# Patient Record
Sex: Female | Born: 1935 | Race: White | Hispanic: No | Smoking: Never smoker
Health system: Southern US, Community
[De-identification: ages and names within clinical notes are randomized; demographics above are authoritative.]

## PROBLEM LIST (undated history)

## (undated) DIAGNOSIS — H269 Unspecified cataract: Secondary | ICD-10-CM

## (undated) DIAGNOSIS — R32 Unspecified urinary incontinence: Secondary | ICD-10-CM

## (undated) DIAGNOSIS — M810 Age-related osteoporosis without current pathological fracture: Secondary | ICD-10-CM

## (undated) DIAGNOSIS — Z8619 Personal history of other infectious and parasitic diseases: Secondary | ICD-10-CM

## (undated) DIAGNOSIS — K649 Unspecified hemorrhoids: Secondary | ICD-10-CM

## (undated) DIAGNOSIS — E559 Vitamin D deficiency, unspecified: Secondary | ICD-10-CM

## (undated) DIAGNOSIS — M7581 Other shoulder lesions, right shoulder: Secondary | ICD-10-CM

## (undated) DIAGNOSIS — Z8659 Personal history of other mental and behavioral disorders: Secondary | ICD-10-CM

## (undated) DIAGNOSIS — C801 Malignant (primary) neoplasm, unspecified: Secondary | ICD-10-CM

## (undated) DIAGNOSIS — M722 Plantar fascial fibromatosis: Secondary | ICD-10-CM

## (undated) DIAGNOSIS — T7840XA Allergy, unspecified, initial encounter: Secondary | ICD-10-CM

## (undated) DIAGNOSIS — R011 Cardiac murmur, unspecified: Secondary | ICD-10-CM

## (undated) DIAGNOSIS — E785 Hyperlipidemia, unspecified: Secondary | ICD-10-CM

## (undated) DIAGNOSIS — M1990 Unspecified osteoarthritis, unspecified site: Secondary | ICD-10-CM

## (undated) DIAGNOSIS — M8580 Other specified disorders of bone density and structure, unspecified site: Secondary | ICD-10-CM

## (undated) DIAGNOSIS — E348 Other specified endocrine disorders: Secondary | ICD-10-CM

## (undated) DIAGNOSIS — B181 Chronic viral hepatitis B without delta-agent: Secondary | ICD-10-CM

## (undated) HISTORY — DX: Personal history of other infectious and parasitic diseases: Z86.19

## (undated) HISTORY — DX: Age-related osteoporosis without current pathological fracture: M81.0

## (undated) HISTORY — DX: Other specified endocrine disorders: E34.8

## (undated) HISTORY — DX: Vitamin D deficiency, unspecified: E55.9

## (undated) HISTORY — DX: Other specified disorders of bone density and structure, unspecified site: M85.80

## (undated) HISTORY — DX: Plantar fascial fibromatosis: M72.2

## (undated) HISTORY — DX: Cardiac murmur, unspecified: R01.1

## (undated) HISTORY — PX: BLEPHAROPLASTY: SUR158

## (undated) HISTORY — DX: Personal history of other mental and behavioral disorders: Z86.59

## (undated) HISTORY — DX: Unspecified osteoarthritis, unspecified site: M19.90

## (undated) HISTORY — DX: Allergy, unspecified, initial encounter: T78.40XA

## (undated) HISTORY — DX: Unspecified cataract: H26.9

## (undated) HISTORY — PX: EYE SURGERY: SHX253

## (undated) HISTORY — DX: Other shoulder lesions, right shoulder: M75.81

## (undated) HISTORY — DX: Chronic viral hepatitis B without delta-agent: B18.1

## (undated) HISTORY — DX: Malignant (primary) neoplasm, unspecified: C80.1

## (undated) HISTORY — DX: Hyperlipidemia, unspecified: E78.5

## (undated) HISTORY — DX: Unspecified urinary incontinence: R32

## (undated) HISTORY — PX: TONSILLECTOMY: SHX5217

## (undated) HISTORY — PX: COLONOSCOPY: SHX174

## (undated) HISTORY — DX: Unspecified hemorrhoids: K64.9

---
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
## 2004-10-03 DIAGNOSIS — Z8659 Personal history of other mental and behavioral disorders: Secondary | ICD-10-CM

## 2004-10-03 HISTORY — DX: Personal history of other mental and behavioral disorders: Z86.59

## 2011-10-04 DIAGNOSIS — M7581 Other shoulder lesions, right shoulder: Secondary | ICD-10-CM

## 2011-10-04 HISTORY — DX: Other shoulder lesions, right shoulder: M75.81

## 2012-11-03 LAB — HM COLONOSCOPY

## 2013-11-04 ENCOUNTER — Ambulatory Visit (INDEPENDENT_AMBULATORY_CARE_PROVIDER_SITE_OTHER): Payer: Managed Care, Other (non HMO) | Admitting: Internal Medicine

## 2013-11-04 ENCOUNTER — Encounter: Payer: Self-pay | Admitting: Internal Medicine

## 2013-11-04 ENCOUNTER — Ambulatory Visit (INDEPENDENT_AMBULATORY_CARE_PROVIDER_SITE_OTHER)
Admission: RE | Admit: 2013-11-04 | Discharge: 2013-11-04 | Disposition: A | Discharge status: Home / Self Care | Payer: Managed Care, Other (non HMO) | Source: Ambulatory Visit | Attending: Internal Medicine | Admitting: Internal Medicine

## 2013-11-04 VITALS — BP 128/68 | HR 76 | Temp 98.3°F | Ht 62.0 in | Wt 139.6 lb

## 2013-11-04 DIAGNOSIS — R059 Cough, unspecified: Secondary | ICD-10-CM

## 2013-11-04 DIAGNOSIS — R32 Unspecified urinary incontinence: Secondary | ICD-10-CM | POA: Insufficient documentation

## 2013-11-04 DIAGNOSIS — R05 Cough: Secondary | ICD-10-CM

## 2013-11-04 DIAGNOSIS — B181 Chronic viral hepatitis B without delta-agent: Secondary | ICD-10-CM | POA: Insufficient documentation

## 2013-11-04 DIAGNOSIS — J309 Allergic rhinitis, unspecified: Secondary | ICD-10-CM

## 2013-11-04 DIAGNOSIS — E785 Hyperlipidemia, unspecified: Secondary | ICD-10-CM | POA: Insufficient documentation

## 2013-11-04 DIAGNOSIS — Z8619 Personal history of other infectious and parasitic diseases: Secondary | ICD-10-CM | POA: Insufficient documentation

## 2013-11-04 MED ORDER — LORATADINE 10 MG PO TABS: 10.0000 mg | ORAL_TABLET | Freq: Every day | ORAL | Status: DC

## 2013-11-04 MED ORDER — DM-GUAIFENESIN ER 60-1200 MG PO TB12: 1.0000 | ORAL_TABLET | Freq: Two times a day (BID) | ORAL | Status: DC

## 2013-11-04 MED ORDER — PHENYLEPH-PROMETHAZINE-COD 5-6.25-10 MG/5ML PO SYRP: 5.0000 mL | ORAL_SOLUTION | ORAL | Status: DC | PRN

## 2013-11-04 NOTE — Patient Instructions (Signed)
It was good to see you today.  We have reviewed your prior records including labs and tests today  Chest xray ordered today. Your results will be released to [place] (or called to you) after review, usually within 72hours after test completion. If any changes need to be made, you will be notified at that same time.  Medications reviewed and updated I recommend taking generic Claritin once daily, Mucinex DM every morning and prescription cough medication, especially at night for one week, then as needed  Your prescription(s) have been Given to you to submit to your pharmacy. Please take as directed and contact our office if you believe you are having problem(s) with the medication(s).  followup in next 6-8 weeks for annual exam, please call sooner if problems

## 2013-11-04 NOTE — Progress Notes (Signed)
Subjective:    Patient ID: Terry Edwards, female    DOB: 1935-11-08, 78 y.o.   MRN: [id number]  Cough This is a recurrent problem. The current episode started in the past 7 days (initial sx early 10/2013 (>30 days ago)). The problem has been waxing and waning. The problem occurs hourly. The cough is non-productive. Associated symptoms include nasal congestion, postnasal drip and rhinorrhea. Pertinent negatives include no chest pain, chills, ear congestion, ear pain, fever, headaches, heartburn, hemoptysis ("brown" sputum on occ), rash, sore throat, shortness of breath, sweats, weight loss or wheezing. Nothing aggravates the symptoms. Risk factors for lung disease include travel. Treatments tried: Zpak 3 weeks ago, Vit C. The treatment provided mild relief. There is no history of asthma, bronchiectasis, COPD, emphysema or environmental allergies.    Past Medical History  Diagnosis Date  . Hyperlipidemia   . Urine incontinence   . History of chicken pox   . Chronic hepatitis B without hepatic coma     Review of Systems  Constitutional: Negative for fever, chills and weight loss.  HENT: Positive for postnasal drip and rhinorrhea. Negative for ear pain and sore throat.   Respiratory: Positive for cough. Negative for hemoptysis ("brown" sputum on occ), shortness of breath and wheezing.   Cardiovascular: Negative for chest pain.  Gastrointestinal: Negative for heartburn.  Skin: Negative for rash.  Allergic/Immunologic: Negative for environmental allergies.  Neurological: Negative for headaches.       Objective:   Physical Exam BP 128/68  Pulse 76  Temp(Src) 98.3 F (36.8 C) (Oral)  Ht 5\' 2"  (1.575 m)  Wt 139 lb 9.6 oz (63.322 kg)  BMI 25.53 kg/m2  SpO2 96% Wt Readings from Last 3 Encounters:  11/04/13 139 lb 9.6 oz (63.322 kg)   Constitutional: She appears well-developed and well-nourished. No distress. congested but dry cough HENT: Head: Normocephalic and atraumatic. sinus  nontender to palpation Ears: B TMs ok, no erythema or effusion; Nose: pale turbinates with clear discharge, no purulence. Mouth/Throat: Oropharynx is clear and moist. No oropharyngeal exudate. clear PND evident Eyes: Conjunctivae and EOM are normal. Pupils are equal, round, and reactive to light. No scleral icterus.  Neck: Normal range of motion. Neck supple. No JVD present. No thyromegaly present.  Cardiovascular: Normal rate, regular rhythm and normal heart sounds.  No murmur heard. No BLE edema. Pulmonary/Chest: Effort normal and breath sounds normal. No respiratory distress. She has no wheezes.  Abdominal: Soft. Bowel sounds are normal. She exhibits no distension. There is no tenderness. no masses Musculoskeletal: Normal range of motion, no joint effusions. No gross deformities Neurological: She is alert and oriented to person, place, and time. No cranial nerve deficit. Coordination, balance, strength, speech and gait are normal.  Skin: Skin is warm and dry. No rash noted. No erythema.  Psychiatric: She has a normal mood and affect. Her behavior is normal. Judgment and thought content normal.   No results found for this basename: WBC, HGB, HCT, PLT, GLUCOSE, CHOL, TRIG, HDL, LDLDIRECT, LDLCALC, ALT, AST, NA, K, CL, CREATININE, BUN, CO2, TSH, PSA, INR, GLUF, HGBA1C, MICROALBUR      Assessment & Plan:   Cough - intermittent and recurrent symptoms over past 5 weeks since moving into new home. Recent move to [place] from [place] reviewed including travel to [place] over holidays.  No fever, normal O2 sats, no shortness of breath or pain but will check chest x-ray because of persisting brown sputum greater than 30 days  Symptoms appear consistent with  allergic rhinitis and postnasal drip causing cough  Will hold empiric antibiotics unless abnormal chest x-ray Treat etiology of symptoms with daily antihistamine and symptomatic cough relief -Mucinex DM each morning and codeine syrup  qhs  Addendum. Chest x-ray clear. Patient notified of same. No antibiotics indicated at this time. Patient will call if symptoms worse or unimproved with treatment as prescribed

## 2013-11-04 NOTE — Progress Notes (Signed)
Pre-visit discussion using our clinic review tool. No additional management support is needed unless otherwise documented below in the visit note.  

## 2013-11-11 ENCOUNTER — Ambulatory Visit: Payer: Managed Care, Other (non HMO) | Admitting: Internal Medicine

## 2013-12-30 ENCOUNTER — Ambulatory Visit (INDEPENDENT_AMBULATORY_CARE_PROVIDER_SITE_OTHER): Payer: Managed Care, Other (non HMO) | Admitting: Internal Medicine

## 2013-12-30 ENCOUNTER — Encounter: Payer: Self-pay | Admitting: Internal Medicine

## 2013-12-30 VITALS — BP 120/72 | HR 67 | Temp 97.9°F | Ht 62.0 in | Wt 136.0 lb

## 2013-12-30 DIAGNOSIS — M949 Disorder of cartilage, unspecified: Secondary | ICD-10-CM

## 2013-12-30 DIAGNOSIS — M8580 Other specified disorders of bone density and structure, unspecified site: Secondary | ICD-10-CM

## 2013-12-30 DIAGNOSIS — M7052 Other bursitis of knee, left knee: Secondary | ICD-10-CM

## 2013-12-30 DIAGNOSIS — M7581 Other shoulder lesions, right shoulder: Secondary | ICD-10-CM

## 2013-12-30 DIAGNOSIS — Z8659 Personal history of other mental and behavioral disorders: Secondary | ICD-10-CM

## 2013-12-30 DIAGNOSIS — E785 Hyperlipidemia, unspecified: Secondary | ICD-10-CM

## 2013-12-30 DIAGNOSIS — Z0000 Encounter for general adult medical examination without abnormal findings: Secondary | ICD-10-CM

## 2013-12-30 DIAGNOSIS — M899 Disorder of bone, unspecified: Secondary | ICD-10-CM

## 2013-12-30 DIAGNOSIS — E559 Vitamin D deficiency, unspecified: Secondary | ICD-10-CM

## 2013-12-30 DIAGNOSIS — B181 Chronic viral hepatitis B without delta-agent: Secondary | ICD-10-CM

## 2013-12-30 DIAGNOSIS — E348 Other specified endocrine disorders: Secondary | ICD-10-CM

## 2013-12-30 DIAGNOSIS — IMO0002 Reserved for concepts with insufficient information to code with codable children: Secondary | ICD-10-CM

## 2013-12-30 DIAGNOSIS — M810 Age-related osteoporosis without current pathological fracture: Secondary | ICD-10-CM | POA: Insufficient documentation

## 2013-12-30 DIAGNOSIS — Z136 Encounter for screening for cardiovascular disorders: Secondary | ICD-10-CM

## 2013-12-30 DIAGNOSIS — Z1239 Encounter for other screening for malignant neoplasm of breast: Secondary | ICD-10-CM

## 2013-12-30 MED ORDER — DICLOFENAC SODIUM 1 % TD GEL: 2.0000 g | Freq: Four times a day (QID) | TRANSDERMAL | Status: DC

## 2013-12-30 NOTE — Progress Notes (Signed)
Subjective:    Patient ID: Terry Edwards, female    DOB: Jan 10, 1936, 78 y.o.   MRN: [id number]  HPI  patient is here today for annual wellness physical. Patient feels well overall.  Diet: heart healthy  Physical activity: active Depression/mood screen: negative Hearing: intact to whispered voice Visual acuity: grossly normal, performs annual eye exam  ADLs: capable Fall risk: none Home safety: good Cognitive evaluation: intact to orientation, naming, recall and repetition EOL planning: adv directives, full code/ I agree  I have personally reviewed and have noted 1. The patient's medical and social history 2. Their use of alcohol, tobacco or illicit drugs 3. Their current medications and supplements 4. The patient's functional ability including ADL's, fall risks, home safety risks and hearing or visual impairment. 5. Diet and physical activities 6. Evidence for depression or mood disorders   Also reviewed chronic medical issues and interval medical events  Past Medical History  Diagnosis Date  . Hyperlipidemia   . Urine incontinence   . History of chicken pox   . Chronic hepatitis B without hepatic coma    Family History  Problem Relation Age of Onset  . Hypertension Father   . Hypertension Mother   . Hyperlipidemia Mother   . Hyperlipidemia Father   . Diabetes Father   . Myelodysplastic syndrome Mother    History  Substance Use Topics  . Smoking status: Never Smoker   . Smokeless tobacco: Not on file     Comment: widowed, lives alone. 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 Alcohol Use: Yes    Review of Systems  Constitutional: Negative for fever, fatigue and unexpected weight change.  Respiratory: Negative for cough, shortness of breath and wheezing.   Cardiovascular: Negative for chest pain, palpitations and leg swelling.  Gastrointestinal: Negative for nausea, vomiting, abdominal pain, diarrhea, constipation and rectal pain. Anal  bleeding: occ hemorrhoid sx.  Musculoskeletal: Positive for arthralgias (L medial knee, worst at night). Negative for back pain, gait problem and myalgias.  Skin: Negative for rash and wound.       Chronic toenail fungus  Neurological: Negative for dizziness, weakness, light-headedness and headaches.  Psychiatric/Behavioral: Negative for sleep disturbance and dysphoric mood. The patient is not nervous/anxious.   All other systems reviewed and are negative.       Objective:   Physical Exam  BP 120/72  Pulse 67  Temp(Src) 97.9 F (36.6 C) (Oral)  Ht 5\' 2"  (1.575 m)  Wt 136 lb (61.689 kg)  BMI 24.87 kg/m2  SpO2 97% Wt Readings from Last 3 Encounters:  12/30/13 136 lb (61.689 kg)  11/04/13 139 lb 9.6 oz (63.322 kg)   Constitutional: She appears well-developed and well-nourished. No distress.  HENT: Head: Normocephalic and atraumatic. Ears: B TMs ok, no erythema or effusion; Nose: Nose normal. Mouth/Throat: Oropharynx is clear and moist. No oropharyngeal exudate.  Eyes: Conjunctivae and EOM are normal. Pupils are equal, round, and reactive to light. No scleral icterus.  Neck: Normal range of motion. Neck supple. No JVD present. No thyromegaly present.  Cardiovascular: Normal rate, regular rhythm and normal heart sounds.  No murmur heard. No BLE edema. Pulmonary/Chest: Effort normal and breath sounds normal. No respiratory distress. She has no wheezes.  Abdominal: Soft. Bowel sounds are normal. She exhibits no distension. There is no tenderness. no masses Musculoskeletal: .tenderness to palpation and slight soft tissue swelling over left pes anserine bursa - knee with Normal range of motion, no joint effusions. No gross  deformities Neurological: She is alert and oriented to person, place, and time. No cranial nerve deficit. Coordination, balance, strength, speech and gait are normal.  Skin: Skin is warm and dry. No rash noted. No erythema.  Psychiatric: She has a normal mood and affect.  Her behavior is normal. Judgment and thought content normal.    No results found for this basename: WBC, HGB, HCT, PLT, GLUCOSE, CHOL, TRIG, HDL, LDLDIRECT, LDLCALC, ALT, AST, NA, K, CL, CREATININE, BUN, CO2, TSH, PSA, INR, GLUF, HGBA1C, MICROALBUR    Dg Chest 2 View  11/04/2013   CLINICAL DATA:  Cough.  Shortness of breath.  EXAM: CHEST  2 VIEW  COMPARISON:  None.  FINDINGS: Mediastinum and hilar structures are normal. The lungs are clear of acute infiltrates. Borderline cardiomegaly with normal pulmonary vascularity. Calcified pulmonary nodules noted consistent with granulomas. Degenerative changes thoracic spine.  IMPRESSION: No active cardiopulmonary disease.   Electronically Signed   By: Marcello Moores  Register   On: 11/04/2013 17:06       Assessment & Plan:   CPX/AWV/v70.0 - Today patient counseled on age appropriate routine health concerns for screening and prevention, each reviewed and up to date or declined. Immunizations reviewed and up to date or declined. Labs/ECG reviewed. Risk factors for depression reviewed and negative. Hearing function and visual acuity are intact. ADLs screened and addressed as needed. Functional ability and level of safety reviewed and appropriate. Education, counseling and referrals performed based on assessed risks today. Patient provided with a copy of personalized plan for preventive services.  Left pes anserine bursitis - pain medial knee - educated on dx, topical NSAIDs rx and given home PT program - pt declines steroid injection at this time but will call if symptoms worsening/ unimproved  Problem List Items Addressed This Visit   Chronic hepatitis B without hepatic coma     Hx dx 1970s with spontaneous resolution of elevated LFTs- Reports 3 prior bx in 1970s "unrevealing" Chronically elevated AFP without progression/change (per pt, records from prior practice pending) Check LFTs and AFP today -consider local hep/GI eval prn    Relevant Orders      Hepatic  function panel      Lipid panel      AFP tumor marker   Cyst of pineal gland   History of depression   Hyperlipidemia     Controls same with diet, exercise and omega 3 Check annually, consider rx tx prn    Osteopenia     Reports prior tx with fosamax - none since 2000 Refer for DEXA update now Continue VitD +Ca    Relevant Orders      DG Bone Density      Vit D  25 hydroxy (rtn osteoporosis monitoring)   Pes anserinus bursitis of left knee   Right rotator cuff tendonitis   Vitamin D deficiency    Other Visit Diagnoses   Routine general medical examination at a health care facility    -  Primary    Relevant Orders       Basic metabolic panel       CBC with Differential       Hepatic function panel       Lipid panel       TSH       Urinalysis, Routine w reflex microscopic    Screening for other and unspecified cardiovascular conditions        Relevant Orders       EKG 12-Lead (Completed)  Other screening breast examination        Relevant Orders       MM DIGITAL SCREENING BILATERAL

## 2013-12-30 NOTE — Progress Notes (Signed)
Pre visit review using our clinic review tool, if applicable. No additional management support is needed unless otherwise documented below in the visit note. 

## 2013-12-30 NOTE — Patient Instructions (Addendum)
It was good to see you today.  We have reviewed your prior records including labs and tests today  Health Maintenance reviewed - refer for mammogram and DEXA -all other recommended immunizations and age-appropriate screenings are up-to-date.  Test(s) ordered today. Your results will be released to [facility] (or called to you) after review, usually within 72hours after test completion. If any changes need to be made, you will be notified at that same time.  Medications reviewed and updated Use Voltaren to your "knee" pain as instructed - No other changes recommended at this time.  Please schedule followup in 6-12 months for annual exam and labs, call sooner if problems.  Health Maintenance, Female A healthy lifestyle and preventative care can promote health and wellness.  Maintain regular health, dental, and eye exams.  Eat a healthy diet. Foods like vegetables, fruits, whole grains, low-fat dairy products, and lean protein foods contain the nutrients you need without too many calories. Decrease your intake of foods high in solid fats, added sugars, and salt. Get information about a proper diet from your caregiver, if necessary.  Regular physical exercise is one of the most important things you can do for your health. Most adults should get at least 150 minutes of moderate-intensity exercise (any activity that increases your heart rate and causes you to sweat) each week. In addition, most adults need muscle-strengthening exercises on 2 or more days a week.   Maintain a healthy weight. The body mass index (BMI) is a screening tool to identify possible weight problems. It provides an estimate of body fat based on height and weight. Your caregiver can help determine your BMI, and can help you achieve or maintain a healthy weight. For adults 20 years and older:  A BMI below 18.5 is considered underweight.  A BMI of 18.5 to 24.9 is normal.  A BMI of 25 to 29.9 is considered overweight.  A BMI  of 30 and above is considered obese.  Maintain normal blood lipids and cholesterol by exercising and minimizing your intake of saturated fat. Eat a balanced diet with plenty of fruits and vegetables. Blood tests for lipids and cholesterol should begin at age 85 and be repeated every 5 years. If your lipid or cholesterol levels are high, you are over 50, or you are a high risk for heart disease, you may need your cholesterol levels checked more frequently.Ongoing high lipid and cholesterol levels should be treated with medicines if diet and exercise are not effective.  If you smoke, find out from your caregiver how to quit. If you do not use tobacco, do not start.  Lung cancer screening is recommended for adults aged 63 80 years who are at high risk for developing lung cancer because of a history of smoking. Yearly low-dose computed tomography (CT) is recommended for people who have at least a 30-pack-year history of smoking and are a current smoker or have quit within the past 15 years. A pack year of smoking is smoking an average of 1 pack of cigarettes a day for 1 year (for example: 1 pack a day for 30 years or 2 packs a day for 15 years). Yearly screening should continue until the smoker has stopped smoking for at least 15 years. Yearly screening should also be stopped for people who develop a health problem that would prevent them from having lung cancer treatment.  If you are pregnant, do not drink alcohol. If you are breastfeeding, be very cautious about drinking alcohol. If you are  not pregnant and choose to drink alcohol, do not exceed 1 drink per day. One drink is considered to be 12 ounces (355 mL) of beer, 5 ounces (148 mL) of wine, or 1.5 ounces (44 mL) of liquor.  Avoid use of street drugs. Do not share needles with anyone. Ask for help if you need support or instructions about stopping the use of drugs.  High blood pressure causes heart disease and increases the risk of stroke. Blood  pressure should be checked at least every 1 to 2 years. Ongoing high blood pressure should be treated with medicines, if weight loss and exercise are not effective.  If you are 70 to 76 years old, ask your caregiver if you should take aspirin to prevent strokes.  Diabetes screening involves taking a blood sample to check your fasting blood sugar level. This should be done once every 3 years, after age 65, if you are within normal weight and without risk factors for diabetes. Testing should be considered at a younger age or be carried out more frequently if you are overweight and have at least 1 risk factor for diabetes.  Breast cancer screening is essential preventative care for women. You should practice "breast self-awareness." This means understanding the normal appearance and feel of your breasts and may include breast self-examination. Any changes detected, no matter how small, should be reported to a caregiver. Women in their 32s and 30s should have a clinical breast exam (CBE) by a caregiver as part of a regular health exam every 1 to 3 years. After age 57, women should have a CBE every year. Starting at age 6, women should consider having a mammogram (breast X-ray) every year. Women who have a family history of breast cancer should talk to their caregiver about genetic screening. Women at a high risk of breast cancer should talk to their caregiver about having an MRI and a mammogram every year.  Breast cancer gene (BRCA)-related cancer risk assessment is recommended for women who have family members with BRCA-related cancers. BRCA-related cancers include breast, ovarian, tubal, and peritoneal cancers. Having family members with these cancers may be associated with an increased risk for harmful changes (mutations) in the breast cancer genes BRCA1 and BRCA2. Results of the assessment will determine the need for genetic counseling and BRCA1 and BRCA2 testing.  The Pap test is a screening test for  cervical cancer. Women should have a Pap test starting at age 30. Between ages 34 and 40, Pap tests should be repeated every 2 years. Beginning at age 13, you should have a Pap test every 3 years as long as the past 3 Pap tests have been normal. If you had a hysterectomy for a problem that was not cancer or a condition that could lead to cancer, then you no longer need Pap tests. If you are between ages 31 and 20, and you have had normal Pap tests going back 10 years, you no longer need Pap tests. If you have had past treatment for cervical cancer or a condition that could lead to cancer, you need Pap tests and screening for cancer for at least 20 years after your treatment. If Pap tests have been discontinued, risk factors (such as a new sexual partner) need to be reassessed to determine if screening should be resumed. Some women have medical problems that increase the chance of getting cervical cancer. In these cases, your caregiver may recommend more frequent screening and Pap tests.  The human papillomavirus (HPV) test is  an additional test that may be used for cervical cancer screening. The HPV test looks for the virus that can cause the cell changes on the cervix. The cells collected during the Pap test can be tested for HPV. The HPV test could be used to screen women aged 70 years and older, and should be used in women of any age who have unclear Pap test results. After the age of 73, women should have HPV testing at the same frequency as a Pap test.  Colorectal cancer can be detected and often prevented. Most routine colorectal cancer screening begins at the age of 17 and continues through age 82. However, your caregiver may recommend screening at an earlier age if you have risk factors for colon cancer. On a yearly basis, your caregiver may provide home test kits to check for hidden blood in the stool. Use of a small camera at the end of a tube, to directly examine the colon (sigmoidoscopy or  colonoscopy), can detect the earliest forms of colorectal cancer. Talk to your caregiver about this at age 41, when routine screening begins. Direct examination of the colon should be repeated every 5 to 10 years through age 41, unless early forms of pre-cancerous polyps or small growths are found.  Hepatitis C blood testing is recommended for all people born from [birth year] through 1963 and any individual with known risks for hepatitis C.  Practice safe sex. Use condoms and avoid high-risk sexual practices to reduce the spread of sexually transmitted infections (STIs). Sexually active women aged 65 and younger should be checked for Chlamydia, which is a common sexually transmitted infection. Older women with new or multiple partners should also be tested for Chlamydia. Testing for other STIs is recommended if you are sexually active and at increased risk.  Osteoporosis is a disease in which the bones lose minerals and strength with aging. This can result in serious bone fractures. The risk of osteoporosis can be identified using a bone density scan. Women ages 59 and over and women at risk for fractures or osteoporosis should discuss screening with their caregivers. Ask your caregiver whether you should be taking a calcium supplement or vitamin D to reduce the rate of osteoporosis.  Menopause can be associated with physical symptoms and risks. Hormone replacement therapy is available to decrease symptoms and risks. You should talk to your caregiver about whether hormone replacement therapy is right for you.  Use sunscreen. Apply sunscreen liberally and repeatedly throughout the day. You should seek shade when your shadow is shorter than you. Protect yourself by wearing long sleeves, pants, a wide-brimmed hat, and sunglasses year round, whenever you are outdoors.  Notify your caregiver of new moles or changes in moles, especially if there is a change in shape or color. Also notify your caregiver if a mole is  larger than the size of a pencil eraser.  Stay current with your immunizations. Document Released: 04/04/2011 Document Revised: 01/14/2013 Document Reviewed: 04/04/2011 [facility] Patient Information 2014 [employer]. Pes Anserinus Syndrome with Rehab The pes anserine, also known as the goose's foot, is an area of the shinbone (tibia) near the knee joint where the tendons of three of the muscles of the thigh insert into the bone. These muscles are important for bending the knee and bringing the leg across the body. Just underneath the three tendons that attach at the pes anserinus exists a fluid filled sac (bursa) that is meant to reduce the friction between the tendons and the tibia. Pes  anserinus syndrome is a condition that is characterized by inflammation of the bursa (bursitis) and/ or tendonitis (inflammation of the tendon) and may cause severe pain in the lower portion of the inner (medial) side of the knee. SYMPTOMS   Pain and inflammation over the lower portion of the medial side of the knee.  Pain that worsens as the duration of an activity increases.  Pain that worsens when bending the knee, especially against resistance.  A crackling sound (crepitation) when the tendon or bursa is moved or touched. CAUSES  Bursitis and tendonitis are usually characterized as overuse injuries. Common mechanisms of injury include:  Stress placed on the knee from a sudden increase in the intensity, frequency, or duration of training.  Direct trauma to the upper leg (less common). RISK INCREASES WITH:  Endurance sports (distance running or triathletes).  Making changes to or beginning a new training program.  Sports that place stress on the muscles that insert at the pes anserinus, such as those that require pivoting, cutting, or jumping.  Improper training.  Poor strength and flexibility  Failure to warm-up properly before activity.  Improper knee alignment ( knock knees).  Arthritis  of the knee. PREVENTION  Warm up and stretch properly before activity.  Allow for adequate recovery between workouts.  Maintain physical fitness:  Strength, flexibility, and endurance.  Cardiovascular fitness.  Learn and use training methods that will reduce the stress placed on the pes anserinus.  Arch supports (orthotics) may be helpful for those with flat feet. PROGNOSIS  If treated properly, then the symptoms of pes anserinus syndrome usually resolve within 6 weeks.  RELATED COMPLICATIONS   Persistent and potentially chronic pain if the condition is not treated properly.  Re-injury if activity is resumed before the injury is allowed to heal completely, or if one resumes improper training habits. TREATMENT Treatment initially involves the use of ice and medication to help reduce pain and inflammation. The use of strengthening and stretching exercises may help reduce pain with activity. These exercises may be performed at home or with a therapist. Individuals who have flat feet may find benefit in wearing arch supports in their shoes. Some individuals find that compression bandages or knee sleeves help reduce symptoms. Your caregiver may recommend a corticosteroid injection to help reduce inflammation. If symptoms persist, despite conservative treatment for greater than 6 months, then surgery may be recommended.  MEDICATION   If pain medication is necessary, then nonsteroidal anti-inflammatory medications, such as aspirin and ibuprofen, or other minor pain relievers, such as acetaminophen, are often recommended.  Do not take pain medication for 7 days before surgery.  Prescription pain relievers may be given if deemed necessary by your caregiver. Use only as directed and only as much as you need.  Corticosteroid injections may be given by your caregiver. These injections should be reserved for the most serious cases, because they may only be given a certain number of times. SEEK  MEDICAL CARE IF:  Treatment seems to offer no benefit, or the condition worsens.  Any medications produce adverse side effects. EXERCISES  RANGE OF MOTION (ROM) AND STRETCHING EXERCISES - Pes Anserinus Syndrome These exercises may help you when beginning to rehabilitate your injury. Your symptoms may resolve with or without further involvement from your physician, physical therapist or athletic trainer. While completing these exercises, remember:   Restoring tissue flexibility helps normal motion to return to the joints. This allows healthier, less painful movement and activity.  An effective stretch should be  held for at least 30 seconds.  A stretch should never be painful. You should only feel a gentle lengthening or release in the stretched tissue. STRETCH  Hamstrings, Supine  Lie on your back. Loop a belt or towel over the ball of your right / left foot.  Straighten your right / left knee and slowly pull on the belt to raise your leg. Do not allow the right / left knee to bend. Keep your opposite leg flat on the floor.  Raise the leg until you feel a gentle stretch behind your right / left knee or thigh. Hold this position for __________ seconds. Repeat __________ times. Complete this stretch __________ times per day.  STRETCH - Hamstrings, Doorway  Lie on your back with your right / left leg extended and resting on the wall and the opposite leg flat on the ground through the door. Initially, position your bottom farther away from the wall than the illustration shows.  Keep your right / left knee straight. If you feel a stretch behind your knee or thigh, hold this position for __________ seconds.  If you do not feel a stretch, scoot your bottom closer to the door, and hold __________ seconds. Repeat __________ times. Complete this stretch __________ times per day.  STRETCH - Hamstrings/Adductors, V-Sit  Sit on the floor with your legs extended in a large "V," keeping your knees  straight.  With your head and chest upright, bend at your waist reaching for your right foot to stretch your left adductors.  You should feel a stretch in your left inner thigh. Hold for __________ seconds.  Return to the upright position to relax your leg muscles.  Continuing to keep your chest upright, bend straight forward at your waist to stretch your hamstrings.  You should feel a stretch behind both of your thighs and/or knees. Hold for __________ seconds.  Return to the upright position to relax your leg muscles.  Repeat steps 2 through 4. Repeat __________ times. Complete this exercise __________ times per day.  STRETCH - Hamstrings, Standing  Stand or sit and extend your right / left leg, placing your foot on a chair or foot stool  Keeping a slight arch in your low back and your hips straight forward.  Lead with your chest and lean forward at the waist until you feel a gentle stretch in the back of your right / left knee or thigh. (When done correctly, this exercise requires leaning only a small distance.)  Hold this position for __________ seconds. Repeat __________ times. Complete this stretch __________ times per day. STRETCH - Adductors, Lunge  While standing, spread your legs  Lean away from your right / left leg by bending your opposite knee. You may rest your hands on your thigh for balance.  You should feel a stretch in your right / left inner thigh. Hold for __________ seconds. Repeat __________ times. Complete this exercise __________ times per day.  STRETCH - Adductors, Standing  Place your right / left foot on a counter or stable table. Turn away from your leg so both hips line up with your right / left leg.  Keeping your hips facing forward, slowly bend your opposite leg until you feel a gentle stretch on the inside of your right / left thigh.  Hold for __________ seconds. Repeat __________ times. Complete this exercise __________ times per day.   STRENGTHENING EXERCISES - Pes Anserinus Syndrome  These exercises may help you when beginning to rehabilitate your injury. They  may resolve your symptoms with or without further involvement from your physician, physical therapist or athletic trainer. While completing these exercises, remember:   Muscles can gain both the endurance and the strength needed for everyday activities through controlled exercises.  Complete these exercises as instructed by your physician, physical therapist or athletic trainer. Progress the resistance and repetitions only as guided. STRENGTH - Hamstring, Curls  Lay on your stomach with your legs extended. (If you lay on a bed, your feet may hang over the edge.)  Tighten the muscles in the back of your thigh to bend your right / left knee up to 90 degrees. Keep your hips flat on the bed/floor.  Hold this position for __________ seconds.  Slowly lower your leg back to the starting position. Repeat __________ times. Complete this exercise __________ times per day.  OPTIONAL ANKLE WEIGHTS: Begin with ____________________, but DO NOT exceed ____________________. Increase in 1 lb/0.5 kg increments.  STRENGTH - Hip Adductors, Straight Leg Raises  Lie on your side so that your head, shoulders, knee and hip line up. You may place your upper foot in front to help maintain your balance. Your right / left leg should be on the bottom.  Roll your hips slightly forward, so that your hips are stacked directly over each other and your right / left knee is facing forward.  Tense the muscles in your inner thigh and lift your bottom leg 4-6 inches. Hold this position for __________ seconds.  Slowly lower your leg to the starting position. Allow the muscles to fully relax before beginning the next repetition. Repeat __________ times. Complete this exercise __________ times per day.  Document Released: 09/19/2005 Document Revised: 12/12/2011 Document Reviewed: 01/01/2009 [facility]  Patient Information 2014 [place], [place].

## 2013-12-30 NOTE — Assessment & Plan Note (Signed)
Reports prior tx with fosamax - none since 2000 Refer for DEXA update now Continue VitD +Ca

## 2013-12-31 ENCOUNTER — Encounter: Payer: Self-pay | Admitting: Internal Medicine

## 2013-12-31 DIAGNOSIS — E348 Other specified endocrine disorders: Secondary | ICD-10-CM | POA: Insufficient documentation

## 2013-12-31 DIAGNOSIS — M7581 Other shoulder lesions, right shoulder: Secondary | ICD-10-CM | POA: Insufficient documentation

## 2013-12-31 DIAGNOSIS — E559 Vitamin D deficiency, unspecified: Secondary | ICD-10-CM | POA: Insufficient documentation

## 2013-12-31 DIAGNOSIS — Z8659 Personal history of other mental and behavioral disorders: Secondary | ICD-10-CM | POA: Insufficient documentation

## 2013-12-31 DIAGNOSIS — M7052 Other bursitis of knee, left knee: Secondary | ICD-10-CM | POA: Insufficient documentation

## 2013-12-31 NOTE — Assessment & Plan Note (Signed)
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

## 2013-12-31 NOTE — Assessment & Plan Note (Signed)
Controls same with diet, exercise and omega 3 Check annually, consider rx tx prn

## 2014-01-10 ENCOUNTER — Other Ambulatory Visit: Payer: Self-pay | Admitting: Internal Medicine

## 2014-01-10 DIAGNOSIS — Z1231 Encounter for screening mammogram for malignant neoplasm of breast: Secondary | ICD-10-CM

## 2014-01-29 ENCOUNTER — Ambulatory Visit
Admission: RE | Admit: 2014-01-29 | Discharge: 2014-01-29 | Disposition: A | Discharge status: Home / Self Care | Payer: Medicare HMO | Source: Ambulatory Visit | Attending: Internal Medicine | Admitting: Internal Medicine

## 2014-01-29 DIAGNOSIS — M8580 Other specified disorders of bone density and structure, unspecified site: Secondary | ICD-10-CM

## 2014-01-29 DIAGNOSIS — Z1231 Encounter for screening mammogram for malignant neoplasm of breast: Secondary | ICD-10-CM

## 2014-01-30 ENCOUNTER — Encounter: Payer: Self-pay | Admitting: Internal Medicine

## 2014-02-06 ENCOUNTER — Other Ambulatory Visit (INDEPENDENT_AMBULATORY_CARE_PROVIDER_SITE_OTHER): Payer: Medicare HMO

## 2014-02-06 DIAGNOSIS — Z0000 Encounter for general adult medical examination without abnormal findings: Secondary | ICD-10-CM

## 2014-02-06 DIAGNOSIS — B181 Chronic viral hepatitis B without delta-agent: Secondary | ICD-10-CM

## 2014-02-06 DIAGNOSIS — E785 Hyperlipidemia, unspecified: Secondary | ICD-10-CM

## 2014-02-06 DIAGNOSIS — M8580 Other specified disorders of bone density and structure, unspecified site: Secondary | ICD-10-CM

## 2014-02-06 LAB — URINALYSIS, ROUTINE W REFLEX MICROSCOPIC
Bilirubin Urine: NEGATIVE
HGB URINE DIPSTICK: NEGATIVE
Ketones, ur: NEGATIVE
Leukocytes, UA: NEGATIVE
NITRITE: NEGATIVE
Specific Gravity, Urine: >=1.03 — AB (ref 1.000–1.030)
Total Protein, Urine: NEGATIVE
UROBILINOGEN UA: 0.2 (ref 0.0–1.0)
Urine Glucose: NEGATIVE
pH: 6 (ref 5.0–8.0)

## 2014-02-06 LAB — HEPATIC FUNCTION PANEL
ALT: 14 U/L (ref 0–35)
AST: 17 U/L (ref 0–37)
Albumin: 4.1 g/dL (ref 3.5–5.2)
Alkaline Phosphatase: 54 U/L (ref 39–117)
BILIRUBIN DIRECT: 0.1 mg/dL (ref 0.0–0.3)
BILIRUBIN TOTAL: 0.8 mg/dL (ref 0.2–1.2)
Total Protein: 6.8 g/dL (ref 6.0–8.3)

## 2014-02-06 LAB — CBC WITH DIFFERENTIAL/PLATELET
BASOS ABS: 0.1 10*3/uL (ref 0.0–0.1)
Basophils Relative: 1 % (ref 0.0–3.0)
Eosinophils Absolute: 0.1 10*3/uL (ref 0.0–0.7)
Eosinophils Relative: 2.9 % (ref 0.0–5.0)
HEMATOCRIT: 40.6 % (ref 36.0–46.0)
Hemoglobin: 13.8 g/dL (ref 12.0–15.0)
LYMPHS ABS: 2.5 10*3/uL (ref 0.7–4.0)
Lymphocytes Relative: 50.2 % — ABNORMAL HIGH (ref 12.0–46.0)
MCHC: 33.9 g/dL (ref 30.0–36.0)
MCV: 92.3 fl (ref 78.0–100.0)
MONO ABS: 0.5 10*3/uL (ref 0.1–1.0)
Monocytes Relative: 9.6 % (ref 3.0–12.0)
NEUTROS PCT: 36.3 % — AB (ref 43.0–77.0)
Neutro Abs: 1.8 10*3/uL (ref 1.4–7.7)
Platelets: 212 10*3/uL (ref 150.0–400.0)
RBC: 4.4 Mil/uL (ref 3.87–5.11)
RDW: 13.4 % (ref 11.5–15.5)
WBC: 5 10*3/uL (ref 4.0–10.5)

## 2014-02-06 LAB — LIPID PANEL
CHOL/HDL RATIO: 3
CHOLESTEROL: 256 mg/dL — AB (ref 0–200)
HDL: 83.9 mg/dL (ref >39.00)
LDL CALC: 166 mg/dL — AB (ref 0–99)
Triglycerides: 33 mg/dL (ref 0.0–149.0)
VLDL: 6.6 mg/dL (ref 0.0–40.0)

## 2014-02-06 LAB — BASIC METABOLIC PANEL
BUN: 18 mg/dL (ref 6–23)
CHLORIDE: 108 meq/L (ref 96–112)
CO2: 28 mEq/L (ref 19–32)
Calcium: 9.5 mg/dL (ref 8.4–10.5)
Creatinine, Ser: 0.6 mg/dL (ref 0.4–1.2)
GFR: 97.75 mL/min (ref >60.00)
GLUCOSE: 81 mg/dL (ref 70–99)
POTASSIUM: 4 meq/L (ref 3.5–5.1)
Sodium: 142 mEq/L (ref 135–145)

## 2014-02-06 LAB — TSH: TSH: 3.14 u[IU]/mL (ref 0.35–4.50)

## 2014-02-06 LAB — AFP TUMOR MARKER: AFP-Tumor Marker: 10.2 ng/mL — ABNORMAL HIGH (ref 0.0–8.0)

## 2014-02-07 LAB — VITAMIN D 25 HYDROXY (VIT D DEFICIENCY, FRACTURES): Vit D, 25-Hydroxy: 64 ng/mL (ref 30–89)

## 2014-02-26 ENCOUNTER — Ambulatory Visit: Payer: Self-pay | Admitting: Internal Medicine

## 2014-09-03 ENCOUNTER — Encounter: Payer: Self-pay | Admitting: Family

## 2014-09-03 ENCOUNTER — Ambulatory Visit (INDEPENDENT_AMBULATORY_CARE_PROVIDER_SITE_OTHER): Payer: Medicare HMO | Admitting: Family

## 2014-09-03 ENCOUNTER — Ambulatory Visit (INDEPENDENT_AMBULATORY_CARE_PROVIDER_SITE_OTHER)
Admission: RE | Admit: 2014-09-03 | Discharge: 2014-09-03 | Disposition: A | Discharge status: Home / Self Care | Payer: Medicare HMO | Source: Ambulatory Visit | Attending: Family | Admitting: Family

## 2014-09-03 VITALS — BP 160/84 | HR 77 | Temp 98.2°F | Resp 18 | Ht 62.0 in | Wt 137.6 lb

## 2014-09-03 DIAGNOSIS — M79675 Pain in left toe(s): Secondary | ICD-10-CM

## 2014-09-03 DIAGNOSIS — M7052 Other bursitis of knee, left knee: Secondary | ICD-10-CM

## 2014-09-03 DIAGNOSIS — G8929 Other chronic pain: Secondary | ICD-10-CM | POA: Insufficient documentation

## 2014-09-03 DIAGNOSIS — M79674 Pain in right toe(s): Secondary | ICD-10-CM

## 2014-09-03 MED ORDER — IBUPROFEN 800 MG PO TABS: 800.0000 mg | ORAL_TABLET | Freq: Three times a day (TID) | ORAL | Status: DC | PRN

## 2014-09-03 NOTE — Patient Instructions (Signed)
Thank you for choosing [facility].  Summary/Instructions:  Your prescription(s) have been submitted to your pharmacy. Please take as directed and contact our office if you believe you are having problem(s) with the medication(s).  Please stop by radiology on the basement level of the building for your x-rays. Your results will be released to [facility] (or called to you) after review, usually within 72hours after test completion. If any changes need to be made, you will be notified at that same time.  If your symptoms worsen or fail to improve, please contact our office for further instruction, or in case of emergency go directly to the emergency room at the closest medical facility.   Toe Fracture Your caregiver has diagnosed you as having a fractured toe. A toe fracture is a break in the bone of a toe. "Buddy taping" is a way of splinting your broken toe, by taping the broken toe to the toe next to it. This "buddy taping" will keep the injured toe from moving beyond normal range of motion. Buddy taping also helps the toe heal in a more normal alignment. It may take 6 to 8 weeks for the toe injury to heal. [place] your toes taped together for as long as directed by your caregiver or until you see a doctor for a follow-up examination. You can change the tape after bathing. Always use a small piece of gauze or cotton between the toes when taping them together. This will help the skin stay dry and prevent infection.  Apply ice to the injury for 15-20 minutes each hour while awake for the first 2 days. Put the ice in a plastic bag and place a towel between the bag of ice and your skin.  After the first 2 days, apply heat to the injured area. Use heat for the next 2 to 3 days. Place a heating pad on the foot or soak the foot in warm water as directed by your caregiver.  Keep your foot elevated as much as possible to lessen swelling.  Wear sturdy, supportive shoes. The  shoes should not pinch the toes or fit tightly against the toes.  Your caregiver may prescribe a rigid shoe if your foot is very swollen.  Your may be given crutches if the pain is too great and it hurts too much to walk.  Only take over-the-counter or prescription medicines for pain, discomfort, or fever as directed by your caregiver.  If your caregiver has given you a follow-up appointment, it is very important to keep that appointment. Not keeping the appointment could result in a chronic or permanent injury, pain, and disability. If there is any problem keeping the appointment, you must call back to this facility for assistance. SEEK MEDICAL CARE IF:   You have increased pain or swelling, not relieved with medications.  The pain does not get better after 1 week.  Your injured toe is cold when the others are warm. SEEK IMMEDIATE MEDICAL CARE IF:   The toe becomes cold, numb, or white.  The toe becomes hot (inflamed) and red. Document Released: 09/16/2000 Document Revised: 12/12/2011 Document Reviewed: 05/05/2008 [facility] Patient Information 2015 [place], [place]. This information is not intended to replace advice given to you by your health care provider. Make sure you discuss any questions you have with your health care provider.  Pes Anserinus Syndrome with Rehab The pes anserine, also known as the goose's foot, is an area of the shinbone (tibia) near the knee  joint where the tendons of three of the muscles of the thigh insert into the bone. These muscles are important for bending the knee and bringing the leg across the body. Just underneath the three tendons that attach at the pes anserinus exists a fluid filled sac (bursa) that is meant to reduce the friction between the tendons and the tibia. Pes anserinus syndrome is a condition that is characterized by inflammation of the bursa (bursitis) and/ or tendonitis (inflammation of the tendon) and may cause severe pain in the lower  portion of the inner (medial) side of the knee. SYMPTOMS   Pain and inflammation over the lower portion of the medial side of the knee.  Pain that worsens as the duration of an activity increases.  Pain that worsens when bending the knee, especially against resistance.  A crackling sound (crepitation) when the tendon or bursa is moved or touched. CAUSES  Bursitis and tendonitis are usually characterized as overuse injuries. Common mechanisms of injury include:  Stress placed on the knee from a sudden increase in the intensity, frequency, or duration of training.  Direct trauma to the upper leg (less common). RISK INCREASES WITH:  Endurance sports (distance running or triathletes).  Making changes to or beginning a new training program.  Sports that place stress on the muscles that insert at the pes anserinus, such as those that require pivoting, cutting, or jumping.  Improper training.  Poor strength and flexibility  Failure to warm-up properly before activity.  Improper knee alignment ( knock knees).  Arthritis of the knee. PREVENTION  Warm up and stretch properly before activity.  Allow for adequate recovery between workouts.  Maintain physical fitness:  Strength, flexibility, and endurance.  Cardiovascular fitness.  Learn and use training methods that will reduce the stress placed on the pes anserinus.  Arch supports (orthotics) may be helpful for those with flat feet. PROGNOSIS  If treated properly, then the symptoms of pes anserinus syndrome usually resolve within 6 weeks.  RELATED COMPLICATIONS   Persistent and potentially chronic pain if the condition is not treated properly.  Re-injury if activity is resumed before the injury is allowed to heal completely, or if one resumes improper training habits. TREATMENT Treatment initially involves the use of ice and medication to help reduce pain and inflammation. The use of strengthening and stretching exercises  may help reduce pain with activity. These exercises may be performed at home or with a therapist. Individuals who have flat feet may find benefit in wearing arch supports in their shoes. Some individuals find that compression bandages or knee sleeves help reduce symptoms. Your caregiver may recommend a corticosteroid injection to help reduce inflammation. If symptoms persist, despite conservative treatment for greater than 6 months, then surgery may be recommended.  MEDICATION   If pain medication is necessary, then nonsteroidal anti-inflammatory medications, such as aspirin and ibuprofen, or other minor pain relievers, such as acetaminophen, are often recommended.  Do not take pain medication for 7 days before surgery.  Prescription pain relievers may be given if deemed necessary by your caregiver. Use only as directed and only as much as you need.  Corticosteroid injections may be given by your caregiver. These injections should be reserved for the most serious cases, because they may only be given a certain number of times. SEEK MEDICAL CARE IF:  Treatment seems to offer no benefit, or the condition worsens.  Any medications produce adverse side effects. EXERCISES  RANGE OF MOTION (ROM) AND STRETCHING EXERCISES - Pes  Anserinus Syndrome These exercises may help you when beginning to rehabilitate your injury. Your symptoms may resolve with or without further involvement from your physician, physical therapist or athletic trainer. While completing these exercises, remember:   Restoring tissue flexibility helps normal motion to return to the joints. This allows healthier, less painful movement and activity.  An effective stretch should be held for at least 30 seconds.  A stretch should never be painful. You should only feel a gentle lengthening or release in the stretched tissue. STRETCH - Hamstrings, Supine  Lie on your back. Loop a belt or towel over the ball of your right / left  foot.  Straighten your right / left knee and slowly pull on the belt to raise your leg. Do not allow the right / left knee to bend. Keep your opposite leg flat on the floor.  Raise the leg until you feel a gentle stretch behind your right / left knee or thigh. Hold this position for __________ seconds. Repeat __________ times. Complete this stretch __________ times per day.  STRETCH - Hamstrings, Doorway  Lie on your back with your right / left leg extended and resting on the wall and the opposite leg flat on the ground through the door. Initially, position your bottom farther away from the wall than the illustration shows.  Keep your right / left knee straight. If you feel a stretch behind your knee or thigh, hold this position for __________ seconds.  If you do not feel a stretch, scoot your bottom closer to the door, and hold __________ seconds. Repeat __________ times. Complete this stretch __________ times per day.  STRETCH - Hamstrings/Adductors, V-Sit  Sit on the floor with your legs extended in a large "V," keeping your knees straight.  With your head and chest upright, bend at your waist reaching for your right foot to stretch your left adductors.  You should feel a stretch in your left inner thigh. Hold for __________ seconds.  Return to the upright position to relax your leg muscles.  Continuing to keep your chest upright, bend straight forward at your waist to stretch your hamstrings.  You should feel a stretch behind both of your thighs and/or knees. Hold for __________ seconds.  Return to the upright position to relax your leg muscles.  Repeat steps 2 through 4. Repeat __________ times. Complete this exercise __________ times per day.  STRETCH - Hamstrings, Standing  Stand or sit and extend your right / left leg, placing your foot on a chair or foot stool  Keeping a slight arch in your low back and your hips straight forward.  Lead with your chest and lean forward  at the waist until you feel a gentle stretch in the back of your right / left knee or thigh. (When done correctly, this exercise requires leaning only a small distance.)  Hold this position for __________ seconds. Repeat __________ times. Complete this stretch __________ times per day. STRETCH - Adductors, Lunge  While standing, spread your legs  Lean away from your right / left leg by bending your opposite knee. You may rest your hands on your thigh for balance.  You should feel a stretch in your right / left inner thigh. Hold for __________ seconds. Repeat __________ times. Complete this exercise __________ times per day.  STRETCH - Adductors, Standing  Place your right / left foot on a counter or stable table. Turn away from your leg so both hips line up with your right / left  leg.  Keeping your hips facing forward, slowly bend your opposite leg until you feel a gentle stretch on the inside of your right / left thigh.  Hold for __________ seconds. Repeat __________ times. Complete this exercise __________ times per day.  STRENGTHENING EXERCISES - Pes Anserinus Syndrome  These exercises may help you when beginning to rehabilitate your injury. They may resolve your symptoms with or without further involvement from your physician, physical therapist or athletic trainer. While completing these exercises, remember:   Muscles can gain both the endurance and the strength needed for everyday activities through controlled exercises.  Complete these exercises as instructed by your physician, physical therapist or athletic trainer. Progress the resistance and repetitions only as guided. STRENGTH - Hamstring, Curls  Lay on your stomach with your legs extended. (If you lay on a bed, your feet may hang over the edge.)  Tighten the muscles in the back of your thigh to bend your right / left knee up to 90 degrees. Keep your hips flat on the bed/floor.  Hold this position for __________  seconds.  Slowly lower your leg back to the starting position. Repeat __________ times. Complete this exercise __________ times per day.  OPTIONAL ANKLE WEIGHTS: Begin with ____________________, but DO NOT exceed ____________________. Increase in 1 lb/0.5 kg increments.  STRENGTH - Hip Adductors, Straight Leg Raises  Lie on your side so that your head, shoulders, knee and hip line up. You may place your upper foot in front to help maintain your balance. Your right / left leg should be on the bottom.  Roll your hips slightly forward, so that your hips are stacked directly over each other and your right / left knee is facing forward.  Tense the muscles in your inner thigh and lift your bottom leg 4-6 inches. Hold this position for __________ seconds.  Slowly lower your leg to the starting position. Allow the muscles to fully relax before beginning the next repetition. Repeat __________ times. Complete this exercise __________ times per day.  Document Released: 09/19/2005 Document Revised: 12/12/2011 Document Reviewed: 01/01/2009 [facility] Patient Information 2015 [place], [place]. This information is not intended to replace advice given to you by your health care provider. Make sure you discuss any questions you have with your health care provider.

## 2014-09-03 NOTE — Assessment & Plan Note (Signed)
Symptoms and exam consistent with potential contusion of the fourth metatarsal. Cannot rule out fracture. Obtain x-ray of left foot. Continue to ice as needed. May consider wearing a surgical shoe for a week or 2. Follow up if symptoms worsen or fail to improve.  X-ray results are negative for fracture of the fourth metatarsal or in the area of the foot. Most likely contusion. Continue with conservative treatment at this time.

## 2014-09-03 NOTE — Progress Notes (Signed)
Pre visit review using our clinic review tool, if applicable. No additional management support is needed unless otherwise documented below in the visit note. 

## 2014-09-03 NOTE — Progress Notes (Signed)
Subjective:    Patient ID: Milliana J [place], female    DOB: 1935/12/30, 78 y.o.   MRN: [id number]  Chief Complaint  Patient presents with  . Knee Pain    Left knee pain says she has bursitis in that knee, the pain is becoming more frequent, also wants an xray of her toe, hit it on the fireplace a couple weeks ago    HPI:  Salvatore J [place] is a 78 y.o. female who presents today for an acute visit.   1) Left knee pain - Was previously seen for knee pain and diagnosed with bursitis. She was given voltaren gel which originally helped and now has become less effective. Achy pain is located near pes anserine bursae. Has tried no additional treatments. Twisting her knee makes it worse. Intensity is rated 3-4/10.   2) Left Toe pain - Acute symptoms of left fourth toe pain started a couple of weeks ago when she hit it against a slate fireplace. Denies any treatments for her toe. Denies any sounds or sensations heard or felt.          No Known Allergies   Current Outpatient Prescriptions on File Prior to Visit  Medication Sig Dispense Refill  . Ascorbic Acid (VITAMIN C) 1000 MG tablet Take 1,000 mg by mouth daily.    . calcium carbonate (OS-CAL) 600 MG TABS tablet Take 600 mg by mouth 2 (two) times daily with a meal.    . Cholecalciferol (VITAMIN D3) 5000 UNITS TABS Take 1 tablet by mouth daily.    . diclofenac sodium (VOLTAREN) 1 % GEL Apply 2 g topically 4 (four) times daily. 100 g 0  . Multiple Vitamins-Minerals (MULTIVITAMIN WITH MINERALS) tablet Take 1 tablet by mouth daily.    . NON FORMULARY Diaplex (otc) take daily for blood sugar    . Omega-3 Fatty Acids (FISH OIL) 1000 MG CPDR Take by mouth daily.     No current facility-administered medications on file prior to visit.                                                                                                              Review of Systems    See HPI  Objective:    BP 160/84 mmHg  Pulse 77  Temp(Src) 98.2 F (36.8 C)  (Oral)  Resp 18  Ht 5\' 2"  (1.575 m)  Wt 137 lb 9.6 oz (62.415 kg)  BMI 25.16 kg/m2  SpO2 97% Nursing note and vital signs reviewed.  Physical Exam  Constitutional: She is oriented to person, place, and time. She appears well-developed and well-nourished. No distress.  Cardiovascular: Normal rate, regular rhythm, normal heart sounds and intact distal pulses.   Pulmonary/Chest: Effort normal and breath sounds normal.  Musculoskeletal:  Left knee- no obvious deformity, discoloration, or edema present. Tenderness elicited over pes anserine bursa.increased soreness with flexion abduction and external rotation of the hip or crossing legs.  Left fourth toe - no obvious deformity or edema noted. Mild greenish yellow discoloration at the DIP joint  of the fourth metatarsal. Unable to re-create pain upon palpation. Joint glides are negative for pain.  Neurological: She is alert and oriented to person, place, and time.  Skin: Skin is warm and dry.  Psychiatric: She has a normal mood and affect. Her behavior is normal. Judgment and thought content normal.       Assessment & Plan:

## 2014-09-03 NOTE — Assessment & Plan Note (Signed)
Continues to have pain with peds anserine bursitis. Continue Voltaren until as needed. Add 800 mg of ibuprofen daily for the next 4 days, and then continue as needed for pain. If this does not resolve or reduce pain, refer to sports medicine for potential injection of the bursa.

## 2014-09-17 ENCOUNTER — Other Ambulatory Visit: Payer: Self-pay | Admitting: Internal Medicine

## 2014-10-09 ENCOUNTER — Encounter: Payer: Self-pay | Admitting: Internal Medicine

## 2014-10-09 ENCOUNTER — Ambulatory Visit (INDEPENDENT_AMBULATORY_CARE_PROVIDER_SITE_OTHER): Payer: Medicare HMO | Admitting: Internal Medicine

## 2014-10-09 VITALS — BP 126/80 | HR 82 | Temp 97.5°F | Resp 12 | Ht 62.0 in | Wt 138.2 lb

## 2014-10-09 DIAGNOSIS — M7052 Other bursitis of knee, left knee: Secondary | ICD-10-CM

## 2014-10-09 DIAGNOSIS — M7150 Other bursitis, not elsewhere classified, unspecified site: Secondary | ICD-10-CM

## 2014-10-09 DIAGNOSIS — M7050 Other bursitis of knee, unspecified knee: Secondary | ICD-10-CM

## 2014-10-09 NOTE — Assessment & Plan Note (Signed)
Referral to sports medicine for injection.

## 2014-10-09 NOTE — Progress Notes (Signed)
Pre visit review using our clinic review tool, if applicable. No additional management support is needed unless otherwise documented below in the visit note. 

## 2014-10-09 NOTE — Patient Instructions (Signed)
We will get you scheduled to have the injection in your knee with the sports medicine doctor.   We will see you back in May or June for your physical, if you have any problems or questions before then please feel free to call the office.

## 2014-10-09 NOTE — Progress Notes (Signed)
   Subjective:    Patient ID: Terry Edwards, female    DOB: 18-Oct-1935, 79 y.o.   MRN: [id number]  HPI The patient is a 79 YO female who comes in today to switch providers. She is doing well overall but is having more trouble with her busitis in her knee. She is ready to have an injection as it is keeping her up at night. She has tried high strength ibuprofen and voltaren gel which have not done much. It is not painful with walking. She denies other new complaints. Some cataracts which are not bad now. 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 He has recently returned to [place] and now she still worries about him living alone. She gets some mild post-nasal drip every December for which she does not take any medication.   Review of Systems  Constitutional: Negative for fever, activity change, appetite change, fatigue and unexpected weight change.  HENT: Positive for postnasal drip. Negative for rhinorrhea, sinus pressure and sore throat.   Respiratory: Negative for cough, chest tightness, shortness of breath and wheezing.   Cardiovascular: Negative for chest pain, palpitations and leg swelling.  Gastrointestinal: Negative for nausea, abdominal pain, diarrhea, constipation and abdominal distention.  Musculoskeletal: Positive for myalgias and arthralgias. Negative for back pain and gait problem.  Skin: Negative.   Neurological: Negative.       Objective:   Physical Exam  Constitutional: She is oriented to person, place, and time. She appears well-developed and well-nourished.  HENT:  Head: Normocephalic and atraumatic.  Clear discharge at the back of the oropharynx.  Eyes: EOM are normal.  Neck: Normal range of motion.  Cardiovascular: Normal rate and regular rhythm.   Pulmonary/Chest: Effort normal and breath sounds normal. No respiratory distress. She has no wheezes. She has no rales.  Abdominal:  Soft. Bowel sounds are normal.  Musculoskeletal: She exhibits no edema.  Neurological: She is alert and oriented to person, place, and time.  Skin: Skin is warm and dry.   Filed Vitals:   10/09/14 1307  BP: 126/80  Pulse: 82  Temp: 97.5 F (36.4 C)  TempSrc: Oral  Resp: 12  Height: 5\' 2"  (1.575 m)  Weight: 138 lb 3.2 oz (62.687 kg)  SpO2: 97%      Assessment & Plan:

## 2014-10-10 ENCOUNTER — Other Ambulatory Visit (INDEPENDENT_AMBULATORY_CARE_PROVIDER_SITE_OTHER): Payer: Medicare HMO

## 2014-10-10 ENCOUNTER — Encounter: Payer: Self-pay | Admitting: Family Medicine

## 2014-10-10 ENCOUNTER — Ambulatory Visit (INDEPENDENT_AMBULATORY_CARE_PROVIDER_SITE_OTHER): Payer: Medicare HMO | Admitting: Family Medicine

## 2014-10-10 VITALS — BP 132/78 | HR 67 | Ht 62.0 in | Wt 138.0 lb

## 2014-10-10 DIAGNOSIS — M7052 Other bursitis of knee, left knee: Secondary | ICD-10-CM

## 2014-10-10 DIAGNOSIS — M25562 Pain in left knee: Secondary | ICD-10-CM

## 2014-10-10 NOTE — Assessment & Plan Note (Addendum)
I do believe with patient physical findings the day than most likely diagnosis is probably the pes anserine bursitis. Differential also includes a chronic meniscal tear but I did not see that on ultrasound today. X-rays were ordered today to rule out any other bony abnormality we may be missing with this being more of a nocturnal pain. Encourage patient to try a new topical anti-inflammatory and this was prescribed. We discussed over-the-counter medications help as well. We discussed the compression sleeve as well. Patient given different exercises today which I think would be beneficial as well. Patient will make these changes and come back and see me again in 3-4 weeks for further evaluation and treatment.  If continuing have pain we can consider ultrasound guided injection.

## 2014-10-10 NOTE — Patient Instructions (Signed)
Good to meet you Ice 20 minutes 2 times daily. Usually after activity and before bed.  Compression sleeve to thigh or calf during the day can help Vitamin D continue Turmeric 500mg  twice daily.  Try the pennsaid topically up to 2 times daily.  Exercises 3 times a week.  See me in 3 weeks and if not perfect then will consider injection.

## 2014-10-10 NOTE — Progress Notes (Signed)
[facility] [place], [place] [address] Phone: [phone] Subjective:    I'm seeing this patient by the request  of:  Gwendolyn Grant, MD Dr Doug Sou.   CC: left knee pain  ZDG:LOVFIEPPIR Terry Edwards is a 79 y.o. female coming in with complaint of  left knee pain. Patient states that this left knee pain seems to be widely worse at night. Patient states that she can do her daily activities without any significant discomfort. Patient does not remember any true injury and states that this is been around for approximately 1 year. Seems to be intermittent and not every night. Patient rates the severity of pain is 3-4 out of 10. Patient states though he can keep her up at night which makes it very difficult. Patient was given topical anti-inflammatories with mild improvement. Patient denies any radiation going up or down the leg or any weakness. Patient would just like a diagnosis. Patient was given a handout for some exercises for the pes anserine bursitis. Patient states that these have not been very helpful.     Past medical history, social, surgical and family history all reviewed in electronic medical record.   Review of Systems: No headache, visual changes, nausea, vomiting, diarrhea, constipation, dizziness, abdominal pain, skin rash, fevers, chills, night sweats, weight loss, swollen lymph nodes, body aches, joint swelling, muscle aches, chest pain, shortness of breath, mood changes.   Objective Blood pressure 132/78, pulse 67, height 5\' 2"  (1.575 m), weight 138 lb (62.596 kg), SpO2 97 %.  General: No apparent distress alert and oriented x3 mood and affect normal, dressed appropriately.  HEENT: Pupils equal, extraocular movements intact  Respiratory: Patient's speak in full sentences and does not appear short of breath  Cardiovascular: No lower extremity edema, non tender, no erythema  Skin: Warm dry intact with no signs of infection or rash  on extremities or on axial skeleton.  Abdomen: Soft nontender  Neuro: Cranial nerves II through XII are intact, neurovascularly intact in all extremities with 2+ DTRs and 2+ pulses.  Lymph: No lymphadenopathy of posterior or anterior cervical chain or axillae bilaterally.  Gait normal with good balance and coordination.  MSK:  Non tender with full range of motion and good stability and symmetric strength and tone of shoulders, elbows, wrist, hip, and ankles bilaterally.  Knee: Left Normal to inspection with no erythema or effusion or obvious bony abnormalities. Tender to palpation mostly over the medial joint line and minorly over the tibia. ROM full in flexion and extension and lower leg rotation. Ligaments with solid consistent endpoints including ACL, PCL, LCL, MCL. Negative Mcmurray's, Apley's, and Thessalonian tests. Non painful patellar compression. Patellar glide without crepitus. Patellar and quadriceps tendons unremarkable. Hamstring and quadriceps strength is normal.  Contralateral knee unremarkable  MSK US performed of: Left knee This study was ordered, performed, and interpreted by Charlann Boxer D.O.  Knee: All structures visualized. Anteromedial, meniscus does have degenerative changes but no hypoechoic changes. Pes anserine bursa does not have any significant hypoechoic changes but patient does have increasing Doppler flow in this area. Patellar Tendon unremarkable on long and transverse views without effusion. No abnormality of prepatellar bursa. LCL and MCL unremarkable on long and transverse views. No abnormality of origin of medial or lateral head of the gastrocnemius.  IMPRESSION:  Mild findings of chronic pes bursitis..     Impression and Recommendations:     This case required medical decision making of moderate complexity.

## 2014-10-31 ENCOUNTER — Ambulatory Visit (INDEPENDENT_AMBULATORY_CARE_PROVIDER_SITE_OTHER): Payer: Medicare HMO | Admitting: Family Medicine

## 2014-10-31 ENCOUNTER — Encounter: Payer: Self-pay | Admitting: Family Medicine

## 2014-10-31 VITALS — BP 130/76 | HR 66 | Ht 62.0 in | Wt 138.0 lb

## 2014-10-31 DIAGNOSIS — M7052 Other bursitis of knee, left knee: Secondary | ICD-10-CM

## 2014-10-31 NOTE — Progress Notes (Signed)
  [facility] [place], [place] [address] Phone: [phone] Subjective:     CC: left knee pain follow-up  [id number] Breigh J French [place] is a 79 y.o. female coming in with complaint of  left knee pain. Patient was seen previously and had but seemed to be more of a Pes anserine bursitis. There was a question for a possible chronic meniscal tear. Patient has been doing the home exercises, icing protocol as well as vitamins. Patient has not been doing the topical medicines or the compression sleeve. Patient states though that she is a proximal 85% better. Some mild tightness in the mornings. Denies any radiation of pain or any new symptoms. Overall fairly happy with the results.    Past medical history, social, surgical and family history all reviewed in electronic medical record.   Review of Systems: No headache, visual changes, nausea, vomiting, diarrhea, constipation, dizziness, abdominal pain, skin rash, fevers, chills, night sweats, weight loss, swollen lymph nodes, body aches, joint swelling, muscle aches, chest pain, shortness of breath, mood changes.   Objective Blood pressure 130/76, pulse 66, height 5\' 2"  (1.575 m), weight 138 lb (62.596 kg), SpO2 97 %.  General: No apparent distress alert and oriented x3 mood and affect normal, dressed appropriately.  HEENT: Pupils equal, extraocular movements intact  Respiratory: Patient's speak in full sentences and does not appear short of breath  Cardiovascular: No lower extremity edema, non tender, no erythema  Skin: Warm dry intact with no signs of infection or rash on extremities or on axial skeleton.  Abdomen: Soft nontender  Neuro: Cranial nerves II through XII are intact, neurovascularly intact in all extremities with 2+ DTRs and 2+ pulses.  Lymph: No lymphadenopathy of posterior or anterior cervical chain or axillae bilaterally.  Gait normal with good balance and coordination.  MSK:  Non tender  with full range of motion and good stability and symmetric strength and tone of shoulders, elbows, wrist, hip, and ankles bilaterally.  Knee: Left Normal to inspection with no erythema or effusion or obvious bony abnormalities. Tender to palpation mostly over the medial joint line and more pain over the pes anserine area today ROM full in flexion and extension and lower leg rotation. Ligaments with solid consistent endpoints including ACL, PCL, LCL, MCL. Negative Mcmurray's, Apley's, and Thessalonian tests. Non painful patellar compression. Patellar glide without crepitus. Patellar and quadriceps tendons unremarkable. Hamstring and quadriceps strength is normal.  Contralateral knee unremarkable  MSK US performed of: Left knee This study was ordered, performed, and interpreted by Charlann Boxer D.O.  Knee: All structures visualized. Anteromedial, meniscus does have degenerative changes but no hypoechoic changes. Pes anserine bursa does not have any significant hypoechoic changes continued Doppler flow but seems less than previous exam  Patellar Tendon unremarkable on long and transverse views without effusion. No abnormality of prepatellar bursa. LCL and MCL unremarkable on long and transverse views. No abnormality of origin of medial or lateral head of the gastrocnemius.  IMPRESSION:   chronic pes bursitis still present but improving..     Impression and Recommendations:     This case required medical decision making of moderate complexity.

## 2014-10-31 NOTE — Patient Instructions (Signed)
Good to see you Ice is your friend Continue the exercises 3 times a week Continue the vitmains See me in 1 month if not perfect

## 2014-10-31 NOTE — Assessment & Plan Note (Signed)
Patient is doing better with the conservative therapy at this time. Encourage patient to continue the same regimen at this time. We discussed icing regimen at home exercises. Patient was trying to make these changes. Patient continues to do well we will continue with conservative therapy. Patient will follow-up in 4 weeks. If any worsening pain and we'll consider injection.

## 2014-10-31 NOTE — Progress Notes (Signed)
Pre visit review using our clinic review tool, if applicable. No additional management support is needed unless otherwise documented below in the visit note. 

## 2014-11-03 ENCOUNTER — Ambulatory Visit: Payer: Medicare HMO | Admitting: Internal Medicine

## 2014-12-19 ENCOUNTER — Ambulatory Visit: Payer: Medicare HMO | Admitting: Family Medicine

## 2015-01-09 ENCOUNTER — Ambulatory Visit (INDEPENDENT_AMBULATORY_CARE_PROVIDER_SITE_OTHER): Payer: Medicare HMO | Admitting: Internal Medicine

## 2015-01-09 ENCOUNTER — Encounter: Payer: Self-pay | Admitting: Internal Medicine

## 2015-01-09 ENCOUNTER — Other Ambulatory Visit (INDEPENDENT_AMBULATORY_CARE_PROVIDER_SITE_OTHER): Payer: Medicare HMO

## 2015-01-09 VITALS — BP 124/64 | HR 76 | Temp 98.4°F | Resp 14 | Ht 62.0 in | Wt 139.1 lb

## 2015-01-09 DIAGNOSIS — E559 Vitamin D deficiency, unspecified: Secondary | ICD-10-CM | POA: Diagnosis not present

## 2015-01-09 DIAGNOSIS — Z23 Encounter for immunization: Secondary | ICD-10-CM

## 2015-01-09 DIAGNOSIS — M8580 Other specified disorders of bone density and structure, unspecified site: Secondary | ICD-10-CM

## 2015-01-09 DIAGNOSIS — B181 Chronic viral hepatitis B without delta-agent: Secondary | ICD-10-CM | POA: Diagnosis not present

## 2015-01-09 DIAGNOSIS — Z0000 Encounter for general adult medical examination without abnormal findings: Secondary | ICD-10-CM

## 2015-01-09 DIAGNOSIS — E785 Hyperlipidemia, unspecified: Secondary | ICD-10-CM

## 2015-01-09 DIAGNOSIS — Z2251 Carrier of viral hepatitis B: Secondary | ICD-10-CM | POA: Diagnosis not present

## 2015-01-09 NOTE — Assessment & Plan Note (Signed)
Check lipid panel today, not on therapy but is trying to be conscious of diet.

## 2015-01-09 NOTE — Assessment & Plan Note (Signed)
Check AFP (elevated last year) for change. Check LFTs as well.

## 2015-01-09 NOTE — Assessment & Plan Note (Signed)
Check level today, continue 2000 units daily.

## 2015-01-09 NOTE — Assessment & Plan Note (Signed)
Continue weight bearing activity, calcium and vitamin D. Recheck in 1-2 years (last 2015)

## 2015-01-09 NOTE — Progress Notes (Signed)
   Subjective:    Patient ID: Terry Edwards [place], female    DOB: 1936-08-03, 79 y.o.   MRN: [id number]  HPI Here for medicare wellness, no new complaints. Knee doing better. See A/P for status of chronic medical problems.   Diet: heart healthy Physical activity: active (at least 3 times per week) Depression/mood screen: negative Hearing: intact to whispered voice Visual acuity: new cataracts getting evaluated, performs annual eye exam  ADLs: capable Fall risk: none Home safety: good Cognitive evaluation: intact to orientation, naming, recall and repetition EOL planning: adv directives in place  I have personally reviewed and have noted 1. The patient's medical and social history - reviewed no updates today 2. Their use of alcohol, tobacco or illicit drugs 3. Their current medications and supplements 4. The patient's functional ability including ADL's, fall risks, home safety risks and hearing or visual impairment. 5. Diet and physical activities 6. Evidence for depression or mood disorders 7. Care team reviewed and updated (available in snapshot)  Review of Systems  Constitutional: Negative for fever, activity change, appetite change, fatigue and unexpected weight change.  HENT: Negative for rhinorrhea, sinus pressure and sore throat.   Respiratory: Negative for cough, chest tightness, shortness of breath and wheezing.   Cardiovascular: Negative for chest pain, palpitations and leg swelling.  Gastrointestinal: Negative for nausea, abdominal pain, diarrhea, constipation and abdominal distention.  Musculoskeletal: Negative for back pain and gait problem.  Skin: Negative.   Neurological: Negative.   Psychiatric/Behavioral: Negative.       Objective:   Physical Exam  Constitutional: She is oriented to person, place, and time. She appears well-developed and well-nourished.  HENT:  Head: Normocephalic and atraumatic.  Eyes: EOM are normal.  Neck: Normal range of motion.    Cardiovascular: Normal rate and regular rhythm.   Pulmonary/Chest: Effort normal and breath sounds normal. No respiratory distress. She has no wheezes. She has no rales.  Abdominal: Soft. Bowel sounds are normal.  Musculoskeletal: She exhibits no edema.  Neurological: She is alert and oriented to person, place, and time.  Skin: Skin is warm and dry.   Filed Vitals:   01/09/15 1033  BP: 124/64  Pulse: 76  Temp: 98.4 F (36.9 C)  TempSrc: Oral  Resp: 14  Height: 5\' 2"  (1.575 m)  Weight: 139 lb 1.3 oz (63.086 kg)  SpO2: 95%       Assessment & Plan:  Prevnar 13 given at today's visit

## 2015-01-09 NOTE — Progress Notes (Signed)
Pre visit review using our clinic review tool, if applicable. No additional management support is needed unless otherwise documented below in the visit note. 

## 2015-01-09 NOTE — Patient Instructions (Addendum)
Your exam is normal today and we will check the blood work for the liver, the kidneys, the cholesterol, the vitamin D levels.   We will send in for the mammogram and can do the bone density test next year.   Come back in about 6-12 months. If you are doing well you can wait a year to come back. Keep up the good work with exercise and doing well with the health.   Health Maintenance Adopting a healthy lifestyle and getting preventive care can go a long way to promote health and wellness. Talk with your health care provider about what schedule of regular examinations is right for you. This is a good chance for you to check in with your provider about disease prevention and staying healthy. In between checkups, there are plenty of things you can do on your own. Experts have done a lot of research about which lifestyle changes and preventive measures are most likely to keep you healthy. Ask your health care provider for more information. WEIGHT AND DIET  Eat a healthy diet  Be sure to include plenty of vegetables, fruits, low-fat dairy products, and lean protein.  Do not eat a lot of foods high in solid fats, added sugars, or salt.  Get regular exercise. This is one of the most important things you can do for your health.  Most adults should exercise for at least 150 minutes each week. The exercise should increase your heart rate and make you sweat (moderate-intensity exercise).  Most adults should also do strengthening exercises at least twice a week. This is in addition to the moderate-intensity exercise.  Maintain a healthy weight  Body mass index (BMI) is a measurement that can be used to identify possible weight problems. It estimates body fat based on height and weight. Your health care provider can help determine your BMI and help you achieve or maintain a healthy weight.  For females 49 years of age and older:   A BMI below 18.5 is considered underweight.  A BMI of 18.5 to 24.9 is  normal.  A BMI of 25 to 29.9 is considered overweight.  A BMI of 30 and above is considered obese.  Watch levels of cholesterol and blood lipids  You should start having your blood tested for lipids and cholesterol at 77 years of age, then have this test every 5 years.  You may need to have your cholesterol levels checked more often if:  Your lipid or cholesterol levels are high.  You are older than 77 years of age.  You are at high risk for heart disease.  CANCER SCREENING   Lung Cancer  Lung cancer screening is recommended for adults 85-29 years old who are at high risk for lung cancer because of a history of smoking.  A yearly low-dose CT scan of the lungs is recommended for people who:  Currently smoke.  Have quit within the past 15 years.  Have at least a 30-pack-year history of smoking. A pack year is smoking an average of one pack of cigarettes a day for 1 year.  Yearly screening should continue until it has been 15 years since you quit.  Yearly screening should stop if you develop a health problem that would prevent you from having lung cancer treatment.  Breast Cancer  Practice breast self-awareness. This means understanding how your breasts normally appear and feel.  It also means doing regular breast self-exams. Let your health care provider know about any changes, no matter how  small.  If you are in your 20s or 30s, you should have a clinical breast exam (CBE) by a health care provider every 1-3 years as part of a regular health exam.  If you are 40 or older, have a CBE every year. Also consider having a breast X-ray (mammogram) every year.  If you have a family history of breast cancer, talk to your health care provider about genetic screening.  If you are at high risk for breast cancer, talk to your health care provider about having an MRI and a mammogram every year.  Breast cancer gene (BRCA) assessment is recommended for women who have family members  with BRCA-related cancers. BRCA-related cancers include:  Breast.  Ovarian.  Tubal.  Peritoneal cancers.  Results of the assessment will determine the need for genetic counseling and BRCA1 and BRCA2 testing. Cervical Cancer Routine pelvic examinations to screen for cervical cancer are no longer recommended for nonpregnant women who are considered low risk for cancer of the pelvic organs (ovaries, uterus, and vagina) and who do not have symptoms. A pelvic examination may be necessary if you have symptoms including those associated with pelvic infections. Ask your health care provider if a screening pelvic exam is right for you.   The Pap test is the screening test for cervical cancer for women who are considered at risk.  If you had a hysterectomy for a problem that was not cancer or a condition that could lead to cancer, then you no longer need Pap tests.  If you are older than 65 years, and you have had normal Pap tests for the past 10 years, you no longer need to have Pap tests.  If you have had past treatment for cervical cancer or a condition that could lead to cancer, you need Pap tests and screening for cancer for at least 20 years after your treatment.  If you no longer get a Pap test, assess your risk factors if they change (such as having a new sexual partner). This can affect whether you should start being screened again.  Some women have medical problems that increase their chance of getting cervical cancer. If this is the case for you, your health care provider may recommend more frequent screening and Pap tests.  The human papillomavirus (HPV) test is another test that may be used for cervical cancer screening. The HPV test looks for the virus that can cause cell changes in the cervix. The cells collected during the Pap test can be tested for HPV.  The HPV test can be used to screen women 36 years of age and older. Getting tested for HPV can extend the interval between normal  Pap tests from three to five years.  An HPV test also should be used to screen women of any age who have unclear Pap test results.  After 77 years of age, women should have HPV testing as often as Pap tests.  Colorectal Cancer  This type of cancer can be detected and often prevented.  Routine colorectal cancer screening usually begins at 77 years of age and continues through 77 years of age.  Your health care provider may recommend screening at an earlier age if you have risk factors for colon cancer.  Your health care provider may also recommend using home test kits to check for hidden blood in the stool.  A small camera at the end of a tube can be used to examine your colon directly (sigmoidoscopy or colonoscopy). This is done to  check for the earliest forms of colorectal cancer.  Routine screening usually begins at age 44.  Direct examination of the colon should be repeated every 5-10 years through 77 years of age. However, you may need to be screened more often if early forms of precancerous polyps or small growths are found. Skin Cancer  Check your skin from head to toe regularly.  Tell your health care provider about any new moles or changes in moles, especially if there is a change in a mole's shape or color.  Also tell your health care provider if you have a mole that is larger than the size of a pencil eraser.  Always use sunscreen. Apply sunscreen liberally and repeatedly throughout the day.  Protect yourself by wearing long sleeves, pants, a wide-brimmed hat, and sunglasses whenever you are outside. HEART DISEASE, DIABETES, AND HIGH BLOOD PRESSURE   Have your blood pressure checked at least every 1-2 years. High blood pressure causes heart disease and increases the risk of stroke.  If you are between 46 years and 13 years old, ask your health care provider if you should take aspirin to prevent strokes.  Have regular diabetes screenings. This involves taking a blood  sample to check your fasting blood sugar level.  If you are at a normal weight and have a low risk for diabetes, have this test once every three years after 77 years of age.  If you are overweight and have a high risk for diabetes, consider being tested at a younger age or more often. PREVENTING INFECTION  Hepatitis B  If you have a higher risk for hepatitis B, you should be screened for this virus. You are considered at high risk for hepatitis B if:  You were born in a country where hepatitis B is common. Ask your health care provider which countries are considered high risk.  Your parents were born in a high-risk country, and you have not been immunized against hepatitis B (hepatitis B vaccine).  You have HIV or AIDS.  You use needles to inject street drugs.  You live with someone who has hepatitis B.  You have had sex with someone who has hepatitis B.  You get hemodialysis treatment.  You take certain medicines for conditions, including cancer, organ transplantation, and autoimmune conditions. Hepatitis C  Blood testing is recommended for:  Everyone born from [birth year] through 1963.  Anyone with known risk factors for hepatitis C. Sexually transmitted infections (STIs)  You should be screened for sexually transmitted infections (STIs) including gonorrhea and chlamydia if:  You are sexually active and are younger than 77 years of age.  You are older than 77 years of age and your health care provider tells you that you are at risk for this type of infection.  Your sexual activity has changed since you were last screened and you are at an increased risk for chlamydia or gonorrhea. Ask your health care provider if you are at risk.  If you do not have HIV, but are at risk, it may be recommended that you take a prescription medicine daily to prevent HIV infection. This is called pre-exposure prophylaxis (PrEP). You are considered at risk if:  You are sexually active and do not  regularly use condoms or know the HIV status of your partner(s).  You take drugs by injection.  You are sexually active with a partner who has HIV. Talk with your health care provider about whether you are at high risk of being infected with HIV.  If you choose to begin PrEP, you should first be tested for HIV. You should then be tested every 3 months for as long as you are taking PrEP.  PREGNANCY   If you are premenopausal and you may become pregnant, ask your health care provider about preconception counseling.  If you may become pregnant, take 400 to 800 micrograms (mcg) of folic acid every day.  If you want to prevent pregnancy, talk to your health care provider about birth control (contraception). OSTEOPOROSIS AND MENOPAUSE   Osteoporosis is a disease in which the bones lose minerals and strength with aging. This can result in serious bone fractures. Your risk for osteoporosis can be identified using a bone density scan.  If you are 73 years of age or older, or if you are at risk for osteoporosis and fractures, ask your health care provider if you should be screened.  Ask your health care provider whether you should take a calcium or vitamin D supplement to lower your risk for osteoporosis.  Menopause may have certain physical symptoms and risks.  Hormone replacement therapy may reduce some of these symptoms and risks. Talk to your health care provider about whether hormone replacement therapy is right for you.  HOME CARE INSTRUCTIONS   Schedule regular health, dental, and eye exams.  Stay current with your immunizations.   Do not use any tobacco products including cigarettes, chewing tobacco, or electronic cigarettes.  If you are pregnant, do not drink alcohol.  If you are breastfeeding, limit how much and how often you drink alcohol.  Limit alcohol intake to no more than 1 drink per day for nonpregnant women. One drink equals 12 ounces of beer, 5 ounces of wine, or 1  ounces of hard liquor.  Do not use street drugs.  Do not share needles.  Ask your health care provider for help if you need support or information about quitting drugs.  Tell your health care provider if you often feel depressed.  Tell your health care provider if you have ever been abused or do not feel safe at home. Document Released: 04/04/2011 Document Revised: 02/03/2014 Document Reviewed: 08/21/2013 [facility] Patient Information 2015 [place], [place]. This information is not intended to replace advice given to you by your health care provider. Make sure you discuss any questions you have with your health care provider.

## 2015-01-09 NOTE — Assessment & Plan Note (Signed)
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 Prevnar given today to complete pneumonia series. Shingles done. Flu done this season. Mammogram ordered today. Breast exam normal today. Exercises regularly and non-smoker. Checked moles and recommend repeat screening in about 6-12 months. Advised about sun safety and protection with clothing and sunscreen. She has advanced care planning in place already and has discussed with her family. Screening schedule given to her today of 10 year plan.

## 2015-01-12 ENCOUNTER — Other Ambulatory Visit: Payer: Medicare HMO

## 2015-01-12 LAB — CBC
HEMATOCRIT: 42.2 % (ref 36.0–46.0)
Hemoglobin: 14.2 g/dL (ref 12.0–15.0)
MCHC: 33.7 g/dL (ref 30.0–36.0)
MCV: 90.8 fl (ref 78.0–100.0)
PLATELETS: 222 10*3/uL (ref 150.0–400.0)
RBC: 4.65 Mil/uL (ref 3.87–5.11)
RDW: 13.9 % (ref 11.5–15.5)
WBC: 5.6 10*3/uL (ref 4.0–10.5)

## 2015-01-12 LAB — COMPREHENSIVE METABOLIC PANEL
ALT: 11 U/L (ref 0–35)
AST: 14 U/L (ref 0–37)
Albumin: 4 g/dL (ref 3.5–5.2)
Alkaline Phosphatase: 55 U/L (ref 39–117)
BUN: 18 mg/dL (ref 6–23)
CALCIUM: 9.5 mg/dL (ref 8.4–10.5)
CHLORIDE: 108 meq/L (ref 96–112)
CO2: 29 mEq/L (ref 19–32)
CREATININE: 0.7 mg/dL (ref 0.40–1.20)
GFR: 86.35 mL/min (ref >60.00)
Glucose, Bld: 84 mg/dL (ref 70–99)
Potassium: 4.4 mEq/L (ref 3.5–5.1)
Sodium: 143 mEq/L (ref 135–145)
Total Bilirubin: 0.5 mg/dL (ref 0.2–1.2)
Total Protein: 6.7 g/dL (ref 6.0–8.3)

## 2015-01-12 LAB — VITAMIN D 25 HYDROXY (VIT D DEFICIENCY, FRACTURES): VITD: 50.28 ng/mL (ref 30.00–100.00)

## 2015-01-12 LAB — LIPID PANEL
Cholesterol: 224 mg/dL — ABNORMAL HIGH (ref 0–200)
HDL: 76.2 mg/dL (ref >39.00)
LDL Cholesterol: 134 mg/dL — ABNORMAL HIGH (ref 0–99)
NonHDL: 147.8
Total CHOL/HDL Ratio: 3
Triglycerides: 67 mg/dL (ref 0.0–149.0)
VLDL: 13.4 mg/dL (ref 0.0–40.0)

## 2015-01-12 LAB — TSH: TSH: 3.17 u[IU]/mL (ref 0.35–4.50)

## 2015-01-13 LAB — AFP TUMOR MARKER: AFP TUMOR MARKER: 8.5 ng/mL — AB (ref <6.1)

## 2015-01-14 ENCOUNTER — Other Ambulatory Visit: Payer: Self-pay

## 2015-01-14 DIAGNOSIS — Z1231 Encounter for screening mammogram for malignant neoplasm of breast: Secondary | ICD-10-CM

## 2015-02-02 ENCOUNTER — Ambulatory Visit
Admission: RE | Admit: 2015-02-02 | Discharge: 2015-02-02 | Disposition: A | Discharge status: Home / Self Care | Payer: Medicare HMO | Source: Ambulatory Visit

## 2015-02-02 DIAGNOSIS — Z1231 Encounter for screening mammogram for malignant neoplasm of breast: Secondary | ICD-10-CM

## 2015-07-20 DIAGNOSIS — Z23 Encounter for immunization: Secondary | ICD-10-CM | POA: Diagnosis not present

## 2015-10-31 ENCOUNTER — Ambulatory Visit (INDEPENDENT_AMBULATORY_CARE_PROVIDER_SITE_OTHER): Payer: Medicare HMO | Admitting: Physician Assistant

## 2015-10-31 VITALS — BP 148/80 | HR 89 | Temp 99.2°F | Resp 17 | Ht 61.5 in | Wt 135.4 lb

## 2015-10-31 DIAGNOSIS — J019 Acute sinusitis, unspecified: Secondary | ICD-10-CM

## 2015-10-31 MED ORDER — AMOXICILLIN-POT CLAVULANATE 875-125 MG PO TABS: 1.0000 | ORAL_TABLET | Freq: Two times a day (BID) | ORAL | Status: AC

## 2015-10-31 MED ORDER — GUAIFENESIN ER 1200 MG PO TB12: 1.0000 | ORAL_TABLET | Freq: Two times a day (BID) | ORAL | Status: DC | PRN

## 2015-10-31 NOTE — Patient Instructions (Signed)
Get plenty of rest and drink at least 64 ounces of water daily. 

## 2015-10-31 NOTE — Progress Notes (Signed)
Subjective:    Patient ID: Terry Edwards [place], female    DOB: 1936/03/09, 80 y.o.   MRN: [id number]  Chief Complaint  Patient presents with  . Cough  . Epistaxis    HPI Presents today for cough and epistaxis. Believes she contracted from her sister while they were on a trip [place] from [place]. Symptoms began on 10/21/15, with a small fever, dry cough, head congestion, and loss of apetitie. Highest temperaure she has recorded has been 101F (oral). Believes the cough is due to the draining from her nose into her throat. Symptoms worse during the day, better at night. Has been taking taking OTC mucinex off and on, 2 at night and 2 in the morning, not sure if helping. Hot washcloth across sinuses help to relieve sinus pressure. Vicks at night on chest seems to help as well.  In last three days sinus pressure has reduced but epistaxis of left nostril has developed .  Epistaxis normally lasts for a minute or less. Able to stop the bleeding with tilting head back and pinching nose. Since the start of epistaxis has noticed being able to cough up yellow phlegm with occasional blood which she thinks is due to her drainage.   Review of Systems  HENT: Positive for congestion, nosebleeds, postnasal drip, rhinorrhea and sinus pressure. Negative for dental problem, ear discharge, ear pain, hearing loss, sore throat, tinnitus and trouble swallowing. Voice change: little hoarse.   Eyes: Negative for pain, discharge and visual disturbance.  Respiratory: Positive for cough and shortness of breath (slight). Negative for chest tightness and wheezing.   Cardiovascular: Negative for chest pain and palpitations.  Gastrointestinal: Positive for nausea. Negative for vomiting, abdominal pain, diarrhea and constipation.  Musculoskeletal: Negative for myalgias.  Neurological: Positive for light-headedness (one episode during the course). Negative for dizziness and syncope.   No Known Allergies  Prior to Admission  medications   Medication Sig Start Date End Date Taking? Authorizing Provider  Ascorbic Acid (VITAMIN C) 1000 MG tablet Take 1,000 mg by mouth daily.   Yes Historical Provider, MD  calcium carbonate (OS-CAL) 600 MG TABS tablet Take 600 mg by mouth 2 (two) times daily with a meal.   Yes Historical Provider, MD  Cholecalciferol (VITAMIN D3) 5000 UNITS TABS Take 1 tablet by mouth daily.   Yes Historical Provider, MD  diclofenac sodium (VOLTAREN) 1 % GEL Apply 2 g topically 4 (four) times daily. 09/17/14  Yes Rowe Clack, MD  ibuprofen (ADVIL,MOTRIN) 800 MG tablet Take 1 tablet (800 mg total) by mouth every 8 (eight) hours as needed. 09/03/14  Yes Golden Circle, FNP  Multiple Vitamins-Minerals (MULTIVITAMIN WITH MINERALS) tablet Take 1 tablet by mouth daily.   Yes Historical Provider, MD  NON FORMULARY Diaplex (otc) take daily for blood sugar   Yes Historical Provider, MD  Omega-3 Fatty Acids (FISH OIL) 1000 MG CPDR Take by mouth daily.   Yes Historical Provider, MD                 Patient Active Problem List   Diagnosis Date Noted  . Routine general medical examination at a health care facility 01/09/2015  . Cyst of pineal gland   . History of depression   . Vitamin D deficiency   . Osteopenia   . Chronic hepatitis B without hepatic coma ([place])   . Hyperlipidemia   . Urine incontinence        Objective:   Physical Exam  Constitutional: She appears  well-developed and well-nourished. No distress.  BP 148/80 mmHg  Pulse 89  Temp(Src) 99.2 F (37.3 C) (Oral)  Resp 17  Ht 5' 1.5" (1.562 m)  Wt 135 lb 6.4 oz (61.417 kg)  BMI 25.17 kg/m2  SpO2 97%   HENT:  Head: Normocephalic and atraumatic.  Right Ear: External ear normal.  Left Ear: External ear normal.  Nose: Rhinorrhea present. No mucosal edema, nose lacerations, sinus tenderness, septal deviation or nasal septal hematoma. Epistaxis (left) is observed.  No foreign bodies.  Mouth/Throat: Uvula is midline, oropharynx is  clear and moist and mucous membranes are normal. No oropharyngeal exudate.  Eyes: Conjunctivae and EOM are normal. Pupils are equal, round, and reactive to light. Right eye exhibits no discharge. Left eye exhibits no discharge. No scleral icterus.  Neck: Trachea normal and normal range of motion. Neck supple.  Cardiovascular: Normal rate, regular rhythm, S1 normal and S2 normal.  Exam reveals gallop.   No murmur heard. Pulses:      Radial pulses are 2+ on the right side, and 2+ on the left side.  No edema b/l  Pulmonary/Chest: Effort normal and breath sounds normal. She has no decreased breath sounds. She has no wheezes. She has no rhonchi. She has no rales.  Lymphadenopathy:       Head (right side): No submental, no submandibular, no tonsillar, no preauricular, no posterior auricular and no occipital adenopathy present.       Head (left side): No submental, no submandibular, no tonsillar, no preauricular, no posterior auricular and no occipital adenopathy present.    She has no cervical adenopathy.       Right: No supraclavicular adenopathy present.  Neurological: She is alert.  Skin: Skin is warm and dry. She is not diaphoretic. No cyanosis. No pallor.  Psychiatric: She has a normal mood and affect. Her behavior is normal.  Vitals reviewed.     Assessment & Plan:  1. Subacute sinusitis, unspecified location - Guaifenesin (MUCINEX MAXIMUM STRENGTH) 1200 MG TB12; Take 1 tablet (1,200 mg total) by mouth every 12 (twelve) hours as needed.  Dispense: 14 tablet; Refill: 1 - amoxicillin-clavulanate (AUGMENTIN) 875-125 MG tablet; Take 1 tablet by mouth 2 (two) times daily.  Dispense: 20 tablet; Refill: 0  Return if symptoms worsen or fail to improve.

## 2015-10-31 NOTE — Progress Notes (Signed)
Subjective:   Patient ID: Terry Edwards, female     DOB: January 09, 1936, 80 y.o.    MRN: [id number]  PCP: Hoyt Koch, MD  Chief Complaint  Patient presents with  . Cough  . Epistaxis    HPI  Presents for evaluation of cough and epistaxis.   Believes she contracted an illness from her sister while they were on a trip to [facility] from 1/8-1/23. Symptoms began on 10/21/15, with a small fever, dry cough, head congestion, and loss of appetite. Highest temperaure she has recorded has been 101F (oral). Believes the cough is due to the draining from her nose into her throat. Symptoms worse during the day, better at night. Has been taking taking OTC mucinex off and on, 2 at night and 2 in the morning, not sure if helping. Hot washcloth across sinuses help to relieve sinus pressure. Vicks at night on chest seems to help as well.  In last three days sinus pressure has reduced but epistaxis of left nostril has developed . Epistaxis normally lasts for a minute or less. Able to stop the bleeding with tilting head back and pinching nose. Since the start of epistaxis has noticed being able to cough up yellow phlegm with occasional blood which she thinks is due to her drainage.    Prior to Admission medications   Medication Sig Start Date End Date Taking? Authorizing Provider  Ascorbic Acid (VITAMIN C) 1000 MG tablet Take 1,000 mg by mouth daily.   Yes Historical Provider, MD  calcium carbonate (OS-CAL) 600 MG TABS tablet Take 600 mg by mouth 2 (two) times daily with a meal.   Yes Historical Provider, MD  Cholecalciferol (VITAMIN D3) 5000 UNITS TABS Take 1 tablet by mouth daily.   Yes Historical Provider, MD  diclofenac sodium (VOLTAREN) 1 % GEL Apply 2 g topically 4 (four) times daily. 09/17/14  Yes Rowe Clack, MD  ibuprofen (ADVIL,MOTRIN) 800 MG tablet Take 1 tablet (800 mg total) by mouth every 8 (eight) hours as needed. 09/03/14  Yes Golden Circle, FNP  Multiple Vitamins-Minerals  (MULTIVITAMIN WITH MINERALS) tablet Take 1 tablet by mouth daily.   Yes Historical Provider, MD  NON FORMULARY Diaplex (otc) take daily for blood sugar   Yes Historical Provider, MD  Omega-3 Fatty Acids (FISH OIL) 1000 MG CPDR Take by mouth daily.   Yes Historical Provider, MD     No Known Allergies   Patient Active Problem List   Diagnosis Date Noted  . Routine general medical examination at a health care facility 01/09/2015  . Cyst of pineal gland   . History of depression   . Vitamin D deficiency   . Osteopenia   . Chronic hepatitis B without hepatic coma ([place])   . Hyperlipidemia   . Urine incontinence      Family History  Problem Relation Age of Onset  . Hypertension Father   . Hypertension Mother   . Hyperlipidemia Mother   . Hyperlipidemia Father   . Diabetes Father   . Myelodysplastic syndrome Mother      Social History   Social History  . Marital Status: Widowed    Spouse Name: N/A  . Number of Children: N/A  . Years of Education: N/A   Occupational History  . Not on file.   Social History Main Topics  . Smoking status: Never Smoker   . Smokeless tobacco: Not on file     Comment: widowed, lives alone. Former [employer]arry manager. moved to  [place] from [place] 09/2013  . Alcohol Use: Yes  . Drug Use: No  . Sexual Activity: Not on file   Other Topics Concern  . Not on file   Social History Narrative        Review of Systems HENT: Positive for congestion, nosebleeds, postnasal drip, rhinorrhea and sinus pressure. Negative for dental problem, ear discharge, ear pain, hearing loss, sore throat, tinnitus and trouble swallowing. Voice change: little hoarse.  Eyes: Negative for pain, discharge and visual disturbance.  Respiratory: Positive for cough and shortness of breath (slight). Negative for chest tightness and wheezing.  Cardiovascular: Negative for chest pain and palpitations.  Gastrointestinal: Positive for nausea. Negative for vomiting, abdominal pain,  diarrhea and constipation.  Musculoskeletal: Negative for myalgias.  Neurological: Positive for light-headedness (one episode during the course). Negative for dizziness and syncope.       Objective:  Physical Exam  Constitutional: She is oriented to person, place, and time. She appears well-developed and well-nourished. No distress.  BP 148/80 mmHg  Pulse 89  Temp(Src) 99.2 F (37.3 C) (Oral)  Resp 17  Ht 5' 1.5" (1.562 m)  Wt 135 lb 6.4 oz (61.417 kg)  BMI 25.17 kg/m2  SpO2 97%   HENT:  Head: Normocephalic and atraumatic.  Right Ear: Hearing, tympanic membrane, external ear and ear canal normal.  Left Ear: Hearing, tympanic membrane, external ear and ear canal normal.  Nose: Mucosal edema and rhinorrhea present. No epistaxis (evidence of previos bleeding; not active now).  No foreign bodies. Right sinus exhibits no maxillary sinus tenderness and no frontal sinus tenderness. Left sinus exhibits no maxillary sinus tenderness and no frontal sinus tenderness.  Mouth/Throat: Uvula is midline, oropharynx is clear and moist and mucous membranes are normal. No uvula swelling. No oropharyngeal exudate.  Eyes: Conjunctivae and EOM are normal. Pupils are equal, round, and reactive to light. Right eye exhibits no discharge. Left eye exhibits no discharge. No scleral icterus.  Neck: Trachea normal, normal range of motion and full passive range of motion without pain. Neck supple. No thyroid mass and no thyromegaly present.  Cardiovascular: Normal rate, regular rhythm and normal heart sounds.   Pulmonary/Chest: Effort normal and breath sounds normal.  Lymphadenopathy:       Head (right side): No submandibular, no tonsillar, no preauricular, no posterior auricular and no occipital adenopathy present.       Head (left side): No submandibular, no tonsillar, no preauricular and no occipital adenopathy present.    She has no cervical adenopathy.       Right: No supraclavicular adenopathy present.        Left: No supraclavicular adenopathy present.  Neurological: She is alert and oriented to person, place, and time. She has normal strength. No cranial nerve deficit or sensory deficit.  Skin: Skin is warm, dry and intact. No rash noted.  Psychiatric: She has a normal mood and affect. Her speech is normal and behavior is normal.             Assessment & Plan:  1. Subacute sinusitis, unspecified location Supportive care.  Anticipatory guidance.  RTC if symptoms worsen/persist. - Guaifenesin (MUCINEX MAXIMUM STRENGTH) 1200 MG TB12; Take 1 tablet (1,200 mg total) by mouth every 12 (twelve) hours as needed.  Dispense: 14 tablet; Refill: 1 - amoxicillin-clavulanate (AUGMENTIN) 875-125 MG tablet; Take 1 tablet by mouth 2 (two) times daily.  Dispense: 20 tablet; Refill: 0   Fara Chute, PA-C Physician Assistant-Certified [facility]

## 2015-11-05 DIAGNOSIS — R69 Illness, unspecified: Secondary | ICD-10-CM | POA: Diagnosis not present

## 2015-11-27 DIAGNOSIS — H2513 Age-related nuclear cataract, bilateral: Secondary | ICD-10-CM | POA: Diagnosis not present

## 2015-11-30 DIAGNOSIS — H02831 Dermatochalasis of right upper eyelid: Secondary | ICD-10-CM | POA: Diagnosis not present

## 2015-11-30 DIAGNOSIS — H02834 Dermatochalasis of left upper eyelid: Secondary | ICD-10-CM | POA: Diagnosis not present

## 2016-01-19 ENCOUNTER — Encounter: Payer: Self-pay | Admitting: Internal Medicine

## 2016-01-19 ENCOUNTER — Other Ambulatory Visit (INDEPENDENT_AMBULATORY_CARE_PROVIDER_SITE_OTHER): Payer: Medicare HMO

## 2016-01-19 ENCOUNTER — Ambulatory Visit (INDEPENDENT_AMBULATORY_CARE_PROVIDER_SITE_OTHER): Payer: Medicare HMO | Admitting: Internal Medicine

## 2016-01-19 VITALS — BP 122/64 | HR 74 | Temp 98.5°F | Resp 14 | Ht 62.0 in | Wt 136.4 lb

## 2016-01-19 DIAGNOSIS — Z0000 Encounter for general adult medical examination without abnormal findings: Secondary | ICD-10-CM

## 2016-01-19 DIAGNOSIS — B181 Chronic viral hepatitis B without delta-agent: Secondary | ICD-10-CM

## 2016-01-19 DIAGNOSIS — E559 Vitamin D deficiency, unspecified: Secondary | ICD-10-CM

## 2016-01-19 DIAGNOSIS — E785 Hyperlipidemia, unspecified: Secondary | ICD-10-CM

## 2016-01-19 DIAGNOSIS — M8580 Other specified disorders of bone density and structure, unspecified site: Secondary | ICD-10-CM

## 2016-01-19 LAB — COMPREHENSIVE METABOLIC PANEL
ALT: 14 U/L (ref 0–35)
AST: 18 U/L (ref 0–37)
Albumin: 4.3 g/dL (ref 3.5–5.2)
Alkaline Phosphatase: 57 U/L (ref 39–117)
BUN: 17 mg/dL (ref 6–23)
CHLORIDE: 107 meq/L (ref 96–112)
CO2: 29 meq/L (ref 19–32)
CREATININE: 0.72 mg/dL (ref 0.40–1.20)
Calcium: 9.7 mg/dL (ref 8.4–10.5)
GFR: 83.36 mL/min (ref >60.00)
Glucose, Bld: 85 mg/dL (ref 70–99)
Potassium: 4.1 mEq/L (ref 3.5–5.1)
SODIUM: 143 meq/L (ref 135–145)
Total Bilirubin: 0.7 mg/dL (ref 0.2–1.2)
Total Protein: 6.9 g/dL (ref 6.0–8.3)

## 2016-01-19 LAB — CBC
HCT: 41.3 % (ref 36.0–46.0)
HEMOGLOBIN: 13.9 g/dL (ref 12.0–15.0)
MCHC: 33.7 g/dL (ref 30.0–36.0)
MCV: 90.6 fl (ref 78.0–100.0)
PLATELETS: 219 10*3/uL (ref 150.0–400.0)
RBC: 4.56 Mil/uL (ref 3.87–5.11)
RDW: 14.3 % (ref 11.5–15.5)
WBC: 4.7 10*3/uL (ref 4.0–10.5)

## 2016-01-19 LAB — LIPID PANEL
CHOL/HDL RATIO: 3
Cholesterol: 249 mg/dL — ABNORMAL HIGH (ref 0–200)
HDL: 87.2 mg/dL (ref >39.00)
LDL Cholesterol: 148 mg/dL — ABNORMAL HIGH (ref 0–99)
NONHDL: 161.7
TRIGLYCERIDES: 67 mg/dL (ref 0.0–149.0)
VLDL: 13.4 mg/dL (ref 0.0–40.0)

## 2016-01-19 LAB — TSH: TSH: 2.12 u[IU]/mL (ref 0.35–4.50)

## 2016-01-19 LAB — VITAMIN D 25 HYDROXY (VIT D DEFICIENCY, FRACTURES): VITD: 53.61 ng/mL (ref 30.00–100.00)

## 2016-01-19 NOTE — Assessment & Plan Note (Signed)
Dexa due next year, taking calcium and vitamin D. Does weight bearing exercise.

## 2016-01-19 NOTE — Progress Notes (Signed)
Pre visit review using our clinic review tool, if applicable. No additional management support is needed unless otherwise documented below in the visit note. 

## 2016-01-19 NOTE — Progress Notes (Signed)
   Subjective:    Patient ID: Terry Edwards [place], female    DOB: 01-06-1936, 80 y.o.   MRN: [id number]  HPI Here for medicare wellness, no new complaints. Please see A/P for status and treatment of chronic medical problems.   Diet: heart healthy Physical activity: active Depression/mood screen: negative Hearing: intact to whispered voice Visual acuity: grossly normal with lens, recent cataract surgery, performs annual eye exam  ADLs: capable Fall risk: none Home safety: good Cognitive evaluation: intact to orientation, naming, recall and repetition EOL planning: adv directives discussed, in place  I have personally reviewed and have noted 1. The patient's medical and social history - reviewed today no changes 2. Their use of alcohol, tobacco or illicit drugs 3. Their current medications and supplements 4. The patient's functional ability including ADL's, fall risks, home safety risks and hearing or visual impairment. 5. Diet and physical activities 6. Evidence for depression or mood disorders 7. Care team reviewed and updated (available in snapshot)  Review of Systems  Constitutional: Negative for fever, activity change, appetite change, fatigue and unexpected weight change.  HENT: Negative for rhinorrhea, sinus pressure and sore throat.   Eyes: Negative.   Respiratory: Negative for cough, chest tightness, shortness of breath and wheezing.   Cardiovascular: Negative for chest pain, palpitations and leg swelling.  Gastrointestinal: Negative for nausea, abdominal pain, diarrhea, constipation and abdominal distention.  Musculoskeletal: Positive for myalgias. Negative for back pain, arthralgias and gait problem.  Skin: Negative.   Neurological: Negative.   Psychiatric/Behavioral: Negative.       Objective:   Physical Exam  Constitutional: She is oriented to person, place, and time. She appears well-developed and well-nourished.  HENT:  Head: Normocephalic and atraumatic.  Eyes:  EOM are normal.  Neck: Normal range of motion.  Cardiovascular: Normal rate and regular rhythm.   No murmur heard. Carotids without bruit  Pulmonary/Chest: Effort normal and breath sounds normal. No respiratory distress. She has no wheezes. She has no rales.  Abdominal: Soft. Bowel sounds are normal. She exhibits no distension. There is no tenderness. There is no rebound.  Musculoskeletal: She exhibits no edema.  Neurological: She is alert and oriented to person, place, and time.  Skin: Skin is warm and dry.  Psychiatric: She has a normal mood and affect.   Filed Vitals:   01/19/16 0908  BP: 122/64  Pulse: 74  Temp: 98.5 F (36.9 C)  TempSrc: Oral  Resp: 14  Height: 5\' 2"  (1.575 m)  Weight: 136 lb 6.4 oz (61.871 kg)  SpO2: 97%      Assessment & Plan:

## 2016-01-19 NOTE — Patient Instructions (Signed)
Health Maintenance, Female Adopting a healthy lifestyle and getting preventive care can go a long way to promote health and wellness. Talk with your health care provider about what schedule of regular examinations is right for you. This is a good chance for you to check in with your provider about disease prevention and staying healthy. In between checkups, there are plenty of things you can do on your own. Experts have done a lot of research about which lifestyle changes and preventive measures are most likely to keep you healthy. Ask your health care provider for more information. WEIGHT AND DIET  Eat a healthy diet  Be sure to include plenty of vegetables, fruits, low-fat dairy products, and lean protein.  Do not eat a lot of foods high in solid fats, added sugars, or salt.  Get regular exercise. This is one of the most important things you can do for your health.  Most adults should exercise for at least 150 minutes each week. The exercise should increase your heart rate and make you sweat (moderate-intensity exercise).  Most adults should also do strengthening exercises at least twice a week. This is in addition to the moderate-intensity exercise.  Maintain a healthy weight  Body mass index (BMI) is a measurement that can be used to identify possible weight problems. It estimates body fat based on height and weight. Your health care provider can help determine your BMI and help you achieve or maintain a healthy weight.  For females 20 years of age and older:   A BMI below 18.5 is considered underweight.  A BMI of 18.5 to 24.9 is normal.  A BMI of 25 to 29.9 is considered overweight.  A BMI of 30 and above is considered obese.  Watch levels of cholesterol and blood lipids  You should start having your blood tested for lipids and cholesterol at 78 years of age, then have this test every 5 years.  You may need to have your cholesterol levels checked more often if:  Your lipid  or cholesterol levels are high.  You are older than 78 years of age.  You are at high risk for heart disease.  CANCER SCREENING   Lung Cancer  Lung cancer screening is recommended for adults 55-80 years old who are at high risk for lung cancer because of a history of smoking.  A yearly low-dose CT scan of the lungs is recommended for people who:  Currently smoke.  Have quit within the past 15 years.  Have at least a 30-pack-year history of smoking. A pack year is smoking an average of one pack of cigarettes a day for 1 year.  Yearly screening should continue until it has been 15 years since you quit.  Yearly screening should stop if you develop a health problem that would prevent you from having lung cancer treatment.  Breast Cancer  Practice breast self-awareness. This means understanding how your breasts normally appear and feel.  It also means doing regular breast self-exams. Let your health care provider know about any changes, no matter how small.  If you are in your 20s or 30s, you should have a clinical breast exam (CBE) by a health care provider every 1-3 years as part of a regular health exam.  If you are 40 or older, have a CBE every year. Also consider having a breast X-ray (mammogram) every year.  If you have a family history of breast cancer, talk to your health care provider about genetic screening.  If you   are at high risk for breast cancer, talk to your health care provider about having an MRI and a mammogram every year.  Breast cancer gene (BRCA) assessment is recommended for women who have family members with BRCA-related cancers. BRCA-related cancers include:  Breast.  Ovarian.  Tubal.  Peritoneal cancers.  Results of the assessment will determine the need for genetic counseling and BRCA1 and BRCA2 testing. Cervical Cancer Your health care provider may recommend that you be screened regularly for cancer of the pelvic organs (ovaries, uterus, and  vagina). This screening involves a pelvic examination, including checking for microscopic changes to the surface of your cervix (Pap test). You may be encouraged to have this screening done every 3 years, beginning at age 21.  For women ages 30-65, health care providers may recommend pelvic exams and Pap testing every 3 years, or they may recommend the Pap and pelvic exam, combined with testing for human papilloma virus (HPV), every 5 years. Some types of HPV increase your risk of cervical cancer. Testing for HPV may also be done on women of any age with unclear Pap test results.  Other health care providers may not recommend any screening for nonpregnant women who are considered low risk for pelvic cancer and who do not have symptoms. Ask your health care provider if a screening pelvic exam is right for you.  If you have had past treatment for cervical cancer or a condition that could lead to cancer, you need Pap tests and screening for cancer for at least 20 years after your treatment. If Pap tests have been discontinued, your risk factors (such as having a new sexual partner) need to be reassessed to determine if screening should resume. Some women have medical problems that increase the chance of getting cervical cancer. In these cases, your health care provider may recommend more frequent screening and Pap tests. Colorectal Cancer  This type of cancer can be detected and often prevented.  Routine colorectal cancer screening usually begins at 78 years of age and continues through 78 years of age.  Your health care provider may recommend screening at an earlier age if you have risk factors for colon cancer.  Your health care provider may also recommend using home test kits to check for hidden blood in the stool.  A small camera at the end of a tube can be used to examine your colon directly (sigmoidoscopy or colonoscopy). This is done to check for the earliest forms of colorectal  cancer.  Routine screening usually begins at age 50.  Direct examination of the colon should be repeated every 5-10 years through 78 years of age. However, you may need to be screened more often if early forms of precancerous polyps or small growths are found. Skin Cancer  Check your skin from head to toe regularly.  Tell your health care provider about any new moles or changes in moles, especially if there is a change in a mole's shape or color.  Also tell your health care provider if you have a mole that is larger than the size of a pencil eraser.  Always use sunscreen. Apply sunscreen liberally and repeatedly throughout the day.  Protect yourself by wearing long sleeves, pants, a wide-brimmed hat, and sunglasses whenever you are outside. HEART DISEASE, DIABETES, AND HIGH BLOOD PRESSURE   High blood pressure causes heart disease and increases the risk of stroke. High blood pressure is more likely to develop in:  People who have blood pressure in the high end   of the normal range (130-139/85-89 mm Hg).  People who are overweight or obese.  People who are African American.  If you are 38-23 years of age, have your blood pressure checked every 3-5 years. If you are 61 years of age or older, have your blood pressure checked every year. You should have your blood pressure measured twice--once when you are at a hospital or clinic, and once when you are not at a hospital or clinic. Record the average of the two measurements. To check your blood pressure when you are not at a hospital or clinic, you can use:  An automated blood pressure machine at a pharmacy.  A home blood pressure monitor.  If you are between 45 years and 39 years old, ask your health care provider if you should take aspirin to prevent strokes.  Have regular diabetes screenings. This involves taking a blood sample to check your fasting blood sugar level.  If you are at a normal weight and have a low risk for diabetes,  have this test once every three years after 78 years of age.  If you are overweight and have a high risk for diabetes, consider being tested at a younger age or more often. PREVENTING INFECTION  Hepatitis B  If you have a higher risk for hepatitis B, you should be screened for this virus. You are considered at high risk for hepatitis B if:  You were born in a country where hepatitis B is common. Ask your health care provider which countries are considered high risk.  Your parents were born in a high-risk country, and you have not been immunized against hepatitis B (hepatitis B vaccine).  You have HIV or AIDS.  You use needles to inject street drugs.  You live with someone who has hepatitis B.  You have had sex with someone who has hepatitis B.  You get hemodialysis treatment.  You take certain medicines for conditions, including cancer, organ transplantation, and autoimmune conditions. Hepatitis C  Blood testing is recommended for:  Everyone born from [birth year] through 1963.  Anyone with known risk factors for hepatitis C. Sexually transmitted infections (STIs)  You should be screened for sexually transmitted infections (STIs) including gonorrhea and chlamydia if:  You are sexually active and are younger than 78 years of age.  You are older than 78 years of age and your health care provider tells you that you are at risk for this type of infection.  Your sexual activity has changed since you were last screened and you are at an increased risk for chlamydia or gonorrhea. Ask your health care provider if you are at risk.  If you do not have HIV, but are at risk, it may be recommended that you take a prescription medicine daily to prevent HIV infection. This is called pre-exposure prophylaxis (PrEP). You are considered at risk if:  You are sexually active and do not regularly use condoms or know the HIV status of your partner(s).  You take drugs by injection.  You are sexually  active with a partner who has HIV. Talk with your health care provider about whether you are at high risk of being infected with HIV. If you choose to begin PrEP, you should first be tested for HIV. You should then be tested every 3 months for as long as you are taking PrEP.  PREGNANCY   If you are premenopausal and you may become pregnant, ask your health care provider about preconception counseling.  If you may  become pregnant, take 400 to 800 micrograms (mcg) of folic acid every day.  If you want to prevent pregnancy, talk to your health care provider about birth control (contraception). OSTEOPOROSIS AND MENOPAUSE   Osteoporosis is a disease in which the bones lose minerals and strength with aging. This can result in serious bone fractures. Your risk for osteoporosis can be identified using a bone density scan.  If you are 61 years of age or older, or if you are at risk for osteoporosis and fractures, ask your health care provider if you should be screened.  Ask your health care provider whether you should take a calcium or vitamin D supplement to lower your risk for osteoporosis.  Menopause may have certain physical symptoms and risks.  Hormone replacement therapy may reduce some of these symptoms and risks. Talk to your health care provider about whether hormone replacement therapy is right for you.  HOME CARE INSTRUCTIONS   Schedule regular health, dental, and eye exams.  Stay current with your immunizations.   Do not use any tobacco products including cigarettes, chewing tobacco, or electronic cigarettes.  If you are pregnant, do not drink alcohol.  If you are breastfeeding, limit how much and how often you drink alcohol.  Limit alcohol intake to no more than 1 drink per day for nonpregnant women. One drink equals 12 ounces of beer, 5 ounces of wine, or 1 ounces of hard liquor.  Do not use street drugs.  Do not share needles.  Ask your health care provider for help if  you need support or information about quitting drugs.  Tell your health care provider if you often feel depressed.  Tell your health care provider if you have ever been abused or do not feel safe at home.   This information is not intended to replace advice given to you by your health care provider. Make sure you discuss any questions you have with your health care provider.   Document Released: 04/04/2011 Document Revised: 10/10/2014 Document Reviewed: 08/21/2013 Elsevier Interactive Patient Education [employer].

## 2016-01-19 NOTE — Assessment & Plan Note (Signed)
Checking vitamin D level today.  

## 2016-01-19 NOTE — Assessment & Plan Note (Signed)
Dexa due next year to monitor her osteopenia. Colonoscopy aged out (did sigmoidoscopy) due to next screening at 17 and she declines. Immunizations up to date and reminded about yearly flu shot. Checking labs today. Exercising and non-smoker. Counseled her about sunscreen and dermatology referral placed for small skin lesion near her mouth. Given 10 year screening recommendations.

## 2016-01-19 NOTE — Assessment & Plan Note (Signed)
Checking lipid panel and adjust as needed. Not on meds right now.

## 2016-01-19 NOTE — Assessment & Plan Note (Signed)
Checking AFP as elevated in the past but last year down trending. LFTs as well.

## 2016-01-20 ENCOUNTER — Other Ambulatory Visit: Payer: Self-pay

## 2016-01-20 DIAGNOSIS — Z1231 Encounter for screening mammogram for malignant neoplasm of breast: Secondary | ICD-10-CM

## 2016-01-20 LAB — AFP TUMOR MARKER: AFP-Tumor Marker: 8.7 ng/mL — ABNORMAL HIGH (ref <6.1)

## 2016-01-25 DIAGNOSIS — H02831 Dermatochalasis of right upper eyelid: Secondary | ICD-10-CM | POA: Diagnosis not present

## 2016-01-25 DIAGNOSIS — H02834 Dermatochalasis of left upper eyelid: Secondary | ICD-10-CM | POA: Diagnosis not present

## 2016-02-04 ENCOUNTER — Ambulatory Visit
Admission: RE | Admit: 2016-02-04 | Discharge: 2016-02-04 | Disposition: A | Discharge status: Home / Self Care | Payer: Medicare HMO | Source: Ambulatory Visit

## 2016-02-04 DIAGNOSIS — Z1231 Encounter for screening mammogram for malignant neoplasm of breast: Secondary | ICD-10-CM | POA: Diagnosis not present

## 2016-02-18 DIAGNOSIS — D1801 Hemangioma of skin and subcutaneous tissue: Secondary | ICD-10-CM | POA: Diagnosis not present

## 2016-02-18 DIAGNOSIS — D225 Melanocytic nevi of trunk: Secondary | ICD-10-CM | POA: Diagnosis not present

## 2016-02-18 DIAGNOSIS — L820 Inflamed seborrheic keratosis: Secondary | ICD-10-CM | POA: Diagnosis not present

## 2016-02-18 DIAGNOSIS — L821 Other seborrheic keratosis: Secondary | ICD-10-CM | POA: Diagnosis not present

## 2016-02-18 DIAGNOSIS — D2262 Melanocytic nevi of left upper limb, including shoulder: Secondary | ICD-10-CM | POA: Diagnosis not present

## 2016-02-18 DIAGNOSIS — L814 Other melanin hyperpigmentation: Secondary | ICD-10-CM | POA: Diagnosis not present

## 2016-02-18 DIAGNOSIS — D2239 Melanocytic nevi of other parts of face: Secondary | ICD-10-CM | POA: Diagnosis not present

## 2016-04-20 DIAGNOSIS — H524 Presbyopia: Secondary | ICD-10-CM | POA: Diagnosis not present

## 2016-07-21 ENCOUNTER — Ambulatory Visit (INDEPENDENT_AMBULATORY_CARE_PROVIDER_SITE_OTHER): Payer: Medicare HMO

## 2016-07-21 DIAGNOSIS — Z23 Encounter for immunization: Secondary | ICD-10-CM

## 2016-11-21 DIAGNOSIS — M5386 Other specified dorsopathies, lumbar region: Secondary | ICD-10-CM | POA: Diagnosis not present

## 2016-11-21 DIAGNOSIS — M5415 Radiculopathy, thoracolumbar region: Secondary | ICD-10-CM | POA: Diagnosis not present

## 2016-11-21 DIAGNOSIS — M9903 Segmental and somatic dysfunction of lumbar region: Secondary | ICD-10-CM | POA: Diagnosis not present

## 2016-11-21 DIAGNOSIS — M9902 Segmental and somatic dysfunction of thoracic region: Secondary | ICD-10-CM | POA: Diagnosis not present

## 2016-11-21 DIAGNOSIS — M9905 Segmental and somatic dysfunction of pelvic region: Secondary | ICD-10-CM | POA: Diagnosis not present

## 2016-11-21 DIAGNOSIS — M5137 Other intervertebral disc degeneration, lumbosacral region: Secondary | ICD-10-CM | POA: Diagnosis not present

## 2016-11-23 DIAGNOSIS — M9903 Segmental and somatic dysfunction of lumbar region: Secondary | ICD-10-CM | POA: Diagnosis not present

## 2016-11-23 DIAGNOSIS — M5386 Other specified dorsopathies, lumbar region: Secondary | ICD-10-CM | POA: Diagnosis not present

## 2016-11-23 DIAGNOSIS — M9902 Segmental and somatic dysfunction of thoracic region: Secondary | ICD-10-CM | POA: Diagnosis not present

## 2016-11-23 DIAGNOSIS — M9905 Segmental and somatic dysfunction of pelvic region: Secondary | ICD-10-CM | POA: Diagnosis not present

## 2016-11-23 DIAGNOSIS — M5137 Other intervertebral disc degeneration, lumbosacral region: Secondary | ICD-10-CM | POA: Diagnosis not present

## 2016-11-23 DIAGNOSIS — M5415 Radiculopathy, thoracolumbar region: Secondary | ICD-10-CM | POA: Diagnosis not present

## 2016-12-05 DIAGNOSIS — M5415 Radiculopathy, thoracolumbar region: Secondary | ICD-10-CM | POA: Diagnosis not present

## 2016-12-05 DIAGNOSIS — M5137 Other intervertebral disc degeneration, lumbosacral region: Secondary | ICD-10-CM | POA: Diagnosis not present

## 2016-12-05 DIAGNOSIS — M5386 Other specified dorsopathies, lumbar region: Secondary | ICD-10-CM | POA: Diagnosis not present

## 2016-12-05 DIAGNOSIS — M9903 Segmental and somatic dysfunction of lumbar region: Secondary | ICD-10-CM | POA: Diagnosis not present

## 2016-12-05 DIAGNOSIS — M9902 Segmental and somatic dysfunction of thoracic region: Secondary | ICD-10-CM | POA: Diagnosis not present

## 2016-12-05 DIAGNOSIS — M9905 Segmental and somatic dysfunction of pelvic region: Secondary | ICD-10-CM | POA: Diagnosis not present

## 2016-12-12 DIAGNOSIS — M9905 Segmental and somatic dysfunction of pelvic region: Secondary | ICD-10-CM | POA: Diagnosis not present

## 2016-12-12 DIAGNOSIS — M9903 Segmental and somatic dysfunction of lumbar region: Secondary | ICD-10-CM | POA: Diagnosis not present

## 2016-12-12 DIAGNOSIS — M5137 Other intervertebral disc degeneration, lumbosacral region: Secondary | ICD-10-CM | POA: Diagnosis not present

## 2016-12-12 DIAGNOSIS — M5386 Other specified dorsopathies, lumbar region: Secondary | ICD-10-CM | POA: Diagnosis not present

## 2016-12-12 DIAGNOSIS — M5415 Radiculopathy, thoracolumbar region: Secondary | ICD-10-CM | POA: Diagnosis not present

## 2016-12-12 DIAGNOSIS — M9902 Segmental and somatic dysfunction of thoracic region: Secondary | ICD-10-CM | POA: Diagnosis not present

## 2016-12-19 DIAGNOSIS — M5137 Other intervertebral disc degeneration, lumbosacral region: Secondary | ICD-10-CM | POA: Diagnosis not present

## 2016-12-19 DIAGNOSIS — M9903 Segmental and somatic dysfunction of lumbar region: Secondary | ICD-10-CM | POA: Diagnosis not present

## 2016-12-19 DIAGNOSIS — M5415 Radiculopathy, thoracolumbar region: Secondary | ICD-10-CM | POA: Diagnosis not present

## 2016-12-19 DIAGNOSIS — M9905 Segmental and somatic dysfunction of pelvic region: Secondary | ICD-10-CM | POA: Diagnosis not present

## 2016-12-19 DIAGNOSIS — M5386 Other specified dorsopathies, lumbar region: Secondary | ICD-10-CM | POA: Diagnosis not present

## 2016-12-19 DIAGNOSIS — M9902 Segmental and somatic dysfunction of thoracic region: Secondary | ICD-10-CM | POA: Diagnosis not present

## 2016-12-20 DIAGNOSIS — Z6826 Body mass index (BMI) 26.0-26.9, adult: Secondary | ICD-10-CM | POA: Diagnosis not present

## 2016-12-20 DIAGNOSIS — Z0000 Encounter for general adult medical examination without abnormal findings: Secondary | ICD-10-CM | POA: Diagnosis not present

## 2016-12-20 DIAGNOSIS — M859 Disorder of bone density and structure, unspecified: Secondary | ICD-10-CM | POA: Diagnosis not present

## 2016-12-20 DIAGNOSIS — B181 Chronic viral hepatitis B without delta-agent: Secondary | ICD-10-CM | POA: Diagnosis not present

## 2016-12-20 DIAGNOSIS — Z79899 Other long term (current) drug therapy: Secondary | ICD-10-CM | POA: Diagnosis not present

## 2016-12-26 DIAGNOSIS — M9905 Segmental and somatic dysfunction of pelvic region: Secondary | ICD-10-CM | POA: Diagnosis not present

## 2016-12-26 DIAGNOSIS — M9903 Segmental and somatic dysfunction of lumbar region: Secondary | ICD-10-CM | POA: Diagnosis not present

## 2016-12-26 DIAGNOSIS — M5415 Radiculopathy, thoracolumbar region: Secondary | ICD-10-CM | POA: Diagnosis not present

## 2016-12-26 DIAGNOSIS — M5137 Other intervertebral disc degeneration, lumbosacral region: Secondary | ICD-10-CM | POA: Diagnosis not present

## 2016-12-26 DIAGNOSIS — M5386 Other specified dorsopathies, lumbar region: Secondary | ICD-10-CM | POA: Diagnosis not present

## 2016-12-26 DIAGNOSIS — M9902 Segmental and somatic dysfunction of thoracic region: Secondary | ICD-10-CM | POA: Diagnosis not present

## 2017-01-10 DIAGNOSIS — M5415 Radiculopathy, thoracolumbar region: Secondary | ICD-10-CM | POA: Diagnosis not present

## 2017-01-10 DIAGNOSIS — M9902 Segmental and somatic dysfunction of thoracic region: Secondary | ICD-10-CM | POA: Diagnosis not present

## 2017-01-10 DIAGNOSIS — M9903 Segmental and somatic dysfunction of lumbar region: Secondary | ICD-10-CM | POA: Diagnosis not present

## 2017-01-10 DIAGNOSIS — M9905 Segmental and somatic dysfunction of pelvic region: Secondary | ICD-10-CM | POA: Diagnosis not present

## 2017-01-10 DIAGNOSIS — M5137 Other intervertebral disc degeneration, lumbosacral region: Secondary | ICD-10-CM | POA: Diagnosis not present

## 2017-01-10 DIAGNOSIS — M5386 Other specified dorsopathies, lumbar region: Secondary | ICD-10-CM | POA: Diagnosis not present

## 2017-01-20 ENCOUNTER — Ambulatory Visit (INDEPENDENT_AMBULATORY_CARE_PROVIDER_SITE_OTHER): Payer: Medicare HMO | Admitting: Internal Medicine

## 2017-01-20 ENCOUNTER — Other Ambulatory Visit (INDEPENDENT_AMBULATORY_CARE_PROVIDER_SITE_OTHER): Payer: Medicare HMO

## 2017-01-20 ENCOUNTER — Encounter: Payer: Self-pay | Admitting: Internal Medicine

## 2017-01-20 VITALS — BP 138/72 | HR 75 | Temp 98.0°F | Resp 12 | Ht 62.0 in | Wt 137.8 lb

## 2017-01-20 DIAGNOSIS — B181 Chronic viral hepatitis B without delta-agent: Secondary | ICD-10-CM

## 2017-01-20 DIAGNOSIS — Z0000 Encounter for general adult medical examination without abnormal findings: Secondary | ICD-10-CM

## 2017-01-20 DIAGNOSIS — E785 Hyperlipidemia, unspecified: Secondary | ICD-10-CM

## 2017-01-20 DIAGNOSIS — M8580 Other specified disorders of bone density and structure, unspecified site: Secondary | ICD-10-CM | POA: Diagnosis not present

## 2017-01-20 DIAGNOSIS — E2839 Other primary ovarian failure: Secondary | ICD-10-CM

## 2017-01-20 LAB — COMPREHENSIVE METABOLIC PANEL
ALBUMIN: 4.2 g/dL (ref 3.5–5.2)
ALK PHOS: 58 U/L (ref 39–117)
ALT: 12 U/L (ref 0–35)
AST: 16 U/L (ref 0–37)
BILIRUBIN TOTAL: 0.6 mg/dL (ref 0.2–1.2)
BUN: 20 mg/dL (ref 6–23)
CO2: 26 mEq/L (ref 19–32)
CREATININE: 0.77 mg/dL (ref 0.40–1.20)
Calcium: 9.3 mg/dL (ref 8.4–10.5)
Chloride: 109 mEq/L (ref 96–112)
GFR: 76.95 mL/min (ref >60.00)
GLUCOSE: 84 mg/dL (ref 70–99)
Potassium: 4.2 mEq/L (ref 3.5–5.1)
SODIUM: 143 meq/L (ref 135–145)
Total Protein: 6.7 g/dL (ref 6.0–8.3)

## 2017-01-20 LAB — CBC
HCT: 42.4 % (ref 36.0–46.0)
Hemoglobin: 14.2 g/dL (ref 12.0–15.0)
MCHC: 33.5 g/dL (ref 30.0–36.0)
MCV: 92.1 fl (ref 78.0–100.0)
Platelets: 226 10*3/uL (ref 150.0–400.0)
RBC: 4.61 Mil/uL (ref 3.87–5.11)
RDW: 13.8 % (ref 11.5–15.5)
WBC: 5.2 10*3/uL (ref 4.0–10.5)

## 2017-01-20 LAB — VITAMIN D 25 HYDROXY (VIT D DEFICIENCY, FRACTURES): VITD: 53.06 ng/mL (ref 30.00–100.00)

## 2017-01-20 LAB — LIPID PANEL
CHOLESTEROL: 255 mg/dL — AB (ref 0–200)
HDL: 88.2 mg/dL (ref >39.00)
LDL Cholesterol: 155 mg/dL — ABNORMAL HIGH (ref 0–99)
NONHDL: 166.56
Total CHOL/HDL Ratio: 3
Triglycerides: 56 mg/dL (ref 0.0–149.0)
VLDL: 11.2 mg/dL (ref 0.0–40.0)

## 2017-01-20 NOTE — Assessment & Plan Note (Signed)
Checking lipid panel and adjust as needed.  

## 2017-01-20 NOTE — Assessment & Plan Note (Signed)
Talked to her about shingrix, tetanus, pneumonia and flu up to date. Colonoscopy up to date, counseled about mammogram. Counseled about sun safety and mole surveillance. Given 10 year screening recommendations.

## 2017-01-20 NOTE — Progress Notes (Signed)
Pre visit review using our clinic review tool, if applicable. No additional management support is needed unless otherwise documented below in the visit note. 

## 2017-01-20 NOTE — Assessment & Plan Note (Signed)
Checking dexa, adjust as needed.

## 2017-01-20 NOTE — Progress Notes (Signed)
   Subjective:    Patient ID: Terry Edwards, female    DOB: 1936/04/11, 81 y.o.   MRN: [id number]  HPI Here for medicare wellness and physical, no new complaints. Please see A/P for status and treatment of chronic medical problems.   Diet: heart healthy Physical activity: activity Depression/mood screen: negative Hearing: intact to whispered voice Visual acuity: grossly normal with lens, performs annual eye exam  ADLs: capable Fall risk: none Home safety: good Cognitive evaluation: intact to orientation, naming, recall and repetition EOL planning: adv directives discussed, in place  I have personally reviewed and have noted 1. The patient's medical and social history - reviewed today no changes 2. Their use of alcohol, tobacco or illicit drugs 3. Their current medications and supplements 4. The patient's functional ability including ADL's, fall risks, home safety risks and hearing or visual impairment. 5. Diet and physical activities 6. Evidence for depression or mood disorders 7. Care team reviewed and updated (available in snapshot)  Review of Systems  Constitutional: Negative.   HENT: Negative.   Eyes: Negative.   Respiratory: Negative for cough, chest tightness and shortness of breath.   Cardiovascular: Negative for chest pain, palpitations and leg swelling.  Gastrointestinal: Negative for abdominal distention, abdominal pain, constipation, diarrhea, nausea and vomiting.  Musculoskeletal: Negative.   Skin: Negative.   Neurological: Negative.   Psychiatric/Behavioral: Negative.       Objective:   Physical Exam  Constitutional: She is oriented to person, place, and time. She appears well-developed and well-nourished.  HENT:  Head: Normocephalic and atraumatic.  Eyes: EOM are normal.  Neck: Normal range of motion.  Cardiovascular: Normal rate and regular rhythm.   Carotids clear  Pulmonary/Chest: Effort normal and breath sounds normal. No respiratory distress. She  has no wheezes. She has no rales.  Abdominal: Soft. Bowel sounds are normal. She exhibits no distension. There is no tenderness. There is no rebound.  Musculoskeletal: She exhibits no edema.  Neurological: She is alert and oriented to person, place, and time. Coordination normal.  Skin: Skin is warm and dry.  Psychiatric: She has a normal mood and affect.   Vitals:   01/20/17 0758  BP: (!) 142/70  Pulse: 75  Resp: 12  Temp: 98 F (36.7 C)  TempSrc: Oral  SpO2: 99%  Weight: 137 lb 12 oz (62.5 kg)  Height: 5\' 2"  (1.575 m)      Assessment & Plan:

## 2017-01-20 NOTE — Patient Instructions (Signed)
We will check the labs today and call you back with the results.   We have ordered the bone density and will get that done.   Keep up the good work with your health!  Health Maintenance, Female Adopting a healthy lifestyle and getting preventive care can go a long way to promote health and wellness. Talk with your health care provider about what schedule of regular examinations is right for you. This is a good chance for you to check in with your provider about disease prevention and staying healthy. In between checkups, there are plenty of things you can do on your own. Experts have done a lot of research about which lifestyle changes and preventive measures are most likely to keep you healthy. Ask your health care provider for more information. Weight and diet Eat a healthy diet  Be sure to include plenty of vegetables, fruits, low-fat dairy products, and lean protein.  Do not eat a lot of foods high in solid fats, added sugars, or salt.  Get regular exercise. This is one of the most important things you can do for your health.  Most adults should exercise for at least 150 minutes each week. The exercise should increase your heart rate and make you sweat (moderate-intensity exercise).  Most adults should also do strengthening exercises at least twice a week. This is in addition to the moderate-intensity exercise. Maintain a healthy weight  Body mass index (BMI) is a measurement that can be used to identify possible weight problems. It estimates body fat based on height and weight. Your health care provider can help determine your BMI and help you achieve or maintain a healthy weight.  For females 54 years of age and older:  A BMI below 18.5 is considered underweight.  A BMI of 18.5 to 24.9 is normal.  A BMI of 25 to 29.9 is considered overweight.  A BMI of 30 and above is considered obese. Watch levels of cholesterol and blood lipids  You should start having your blood tested  for lipids and cholesterol at 79 years of age, then have this test every 5 years.  You may need to have your cholesterol levels checked more often if:  Your lipid or cholesterol levels are high.  You are older than 79 years of age.  You are at high risk for heart disease. Cancer screening Lung Cancer  Lung cancer screening is recommended for adults 49-54 years old who are at high risk for lung cancer because of a history of smoking.  A yearly low-dose CT scan of the lungs is recommended for people who:  Currently smoke.  Have quit within the past 15 years.  Have at least a 30-pack-year history of smoking. A pack year is smoking an average of one pack of cigarettes a day for 1 year.  Yearly screening should continue until it has been 15 years since you quit.  Yearly screening should stop if you develop a health problem that would prevent you from having lung cancer treatment. Breast Cancer  Practice breast self-awareness. This means understanding how your breasts normally appear and feel.  It also means doing regular breast self-exams. Let your health care provider know about any changes, no matter how small.  If you are in your 20s or 30s, you should have a clinical breast exam (CBE) by a health care provider every 1-3 years as part of a regular health exam.  If you are 72 or older, have a CBE every year. Also consider  having a breast X-ray (mammogram) every year.  If you have a family history of breast cancer, talk to your health care provider about genetic screening.  If you are at high risk for breast cancer, talk to your health care provider about having an MRI and a mammogram every year.  Breast cancer gene (BRCA) assessment is recommended for women who have family members with BRCA-related cancers. BRCA-related cancers include:  Breast.  Ovarian.  Tubal.  Peritoneal cancers.  Results of the assessment will determine the need for genetic counseling and BRCA1 and  BRCA2 testing. Cervical Cancer  Your health care provider may recommend that you be screened regularly for cancer of the pelvic organs (ovaries, uterus, and vagina). This screening involves a pelvic examination, including checking for microscopic changes to the surface of your cervix (Pap test). You may be encouraged to have this screening done every 3 years, beginning at age 20.  For women ages 36-65, health care providers may recommend pelvic exams and Pap testing every 3 years, or they may recommend the Pap and pelvic exam, combined with testing for human papilloma virus (HPV), every 5 years. Some types of HPV increase your risk of cervical cancer. Testing for HPV may also be done on women of any age with unclear Pap test results.  Other health care providers may not recommend any screening for nonpregnant women who are considered low risk for pelvic cancer and who do not have symptoms. Ask your health care provider if a screening pelvic exam is right for you.  If you have had past treatment for cervical cancer or a condition that could lead to cancer, you need Pap tests and screening for cancer for at least 20 years after your treatment. If Pap tests have been discontinued, your risk factors (such as having a new sexual partner) need to be reassessed to determine if screening should resume. Some women have medical problems that increase the chance of getting cervical cancer. In these cases, your health care provider may recommend more frequent screening and Pap tests. Colorectal Cancer  This type of cancer can be detected and often prevented.  Routine colorectal cancer screening usually begins at 79 years of age and continues through 79 years of age.  Your health care provider may recommend screening at an earlier age if you have risk factors for colon cancer.  Your health care provider may also recommend using home test kits to check for hidden blood in the stool.  A small camera at the end  of a tube can be used to examine your colon directly (sigmoidoscopy or colonoscopy). This is done to check for the earliest forms of colorectal cancer.  Routine screening usually begins at age 64.  Direct examination of the colon should be repeated every 5-10 years through 79 years of age. However, you may need to be screened more often if early forms of precancerous polyps or small growths are found. Skin Cancer  Check your skin from head to toe regularly.  Tell your health care provider about any new moles or changes in moles, especially if there is a change in a mole's shape or color.  Also tell your health care provider if you have a mole that is larger than the size of a pencil eraser.  Always use sunscreen. Apply sunscreen liberally and repeatedly throughout the day.  Protect yourself by wearing long sleeves, pants, a wide-brimmed hat, and sunglasses whenever you are outside. Heart disease, diabetes, and high blood pressure  High blood  pressure causes heart disease and increases the risk of stroke. High blood pressure is more likely to develop in:  People who have blood pressure in the high end of the normal range (130-139/85-89 mm Hg).  People who are overweight or obese.  People who are African American.  If you are 26-48 years of age, have your blood pressure checked every 3-5 years. If you are 46 years of age or older, have your blood pressure checked every year. You should have your blood pressure measured twice-once when you are at a hospital or clinic, and once when you are not at a hospital or clinic. Record the average of the two measurements. To check your blood pressure when you are not at a hospital or clinic, you can use:  An automated blood pressure machine at a pharmacy.  A home blood pressure monitor.  If you are between 77 years and 37 years old, ask your health care provider if you should take aspirin to prevent strokes.  Have regular diabetes screenings.  This involves taking a blood sample to check your fasting blood sugar level.  If you are at a normal weight and have a low risk for diabetes, have this test once every three years after 79 years of age.  If you are overweight and have a high risk for diabetes, consider being tested at a younger age or more often. Preventing infection Hepatitis B  If you have a higher risk for hepatitis B, you should be screened for this virus. You are considered at high risk for hepatitis B if:  You were born in a country where hepatitis B is common. Ask your health care provider which countries are considered high risk.  Your parents were born in a high-risk country, and you have not been immunized against hepatitis B (hepatitis B vaccine).  You have HIV or AIDS.  You use needles to inject street drugs.  You live with someone who has hepatitis B.  You have had sex with someone who has hepatitis B.  You get hemodialysis treatment.  You take certain medicines for conditions, including cancer, organ transplantation, and autoimmune conditions. Hepatitis C  Blood testing is recommended for:  Everyone born from [birth year] through 1963.  Anyone with known risk factors for hepatitis C. Sexually transmitted infections (STIs)  You should be screened for sexually transmitted infections (STIs) including gonorrhea and chlamydia if:  You are sexually active and are younger than 79 years of age.  You are older than 79 years of age and your health care provider tells you that you are at risk for this type of infection.  Your sexual activity has changed since you were last screened and you are at an increased risk for chlamydia or gonorrhea. Ask your health care provider if you are at risk.  If you do not have HIV, but are at risk, it may be recommended that you take a prescription medicine daily to prevent HIV infection. This is called pre-exposure prophylaxis (PrEP). You are considered at risk if:  You are  sexually active and do not regularly use condoms or know the HIV status of your partner(s).  You take drugs by injection.  You are sexually active with a partner who has HIV. Talk with your health care provider about whether you are at high risk of being infected with HIV. If you choose to begin PrEP, you should first be tested for HIV. You should then be tested every 3 months for as long as you  are taking PrEP. Pregnancy  If you are premenopausal and you may become pregnant, ask your health care provider about preconception counseling.  If you may become pregnant, take 400 to 800 micrograms (mcg) of folic acid every day.  If you want to prevent pregnancy, talk to your health care provider about birth control (contraception). Osteoporosis and menopause  Osteoporosis is a disease in which the bones lose minerals and strength with aging. This can result in serious bone fractures. Your risk for osteoporosis can be identified using a bone density scan.  If you are 49 years of age or older, or if you are at risk for osteoporosis and fractures, ask your health care provider if you should be screened.  Ask your health care provider whether you should take a calcium or vitamin D supplement to lower your risk for osteoporosis.  Menopause may have certain physical symptoms and risks.  Hormone replacement therapy may reduce some of these symptoms and risks. Talk to your health care provider about whether hormone replacement therapy is right for you. Follow these instructions at home:  Schedule regular health, dental, and eye exams.  Stay current with your immunizations.  Do not use any tobacco products including cigarettes, chewing tobacco, or electronic cigarettes.  If you are pregnant, do not drink alcohol.  If you are breastfeeding, limit how much and how often you drink alcohol.  Limit alcohol intake to no more than 1 drink per day for nonpregnant women. One drink equals 12 ounces of  beer, 5 ounces of wine, or 1 ounces of hard liquor.  Do not use street drugs.  Do not share needles.  Ask your health care provider for help if you need support or information about quitting drugs.  Tell your health care provider if you often feel depressed.  Tell your health care provider if you have ever been abused or do not feel safe at home. This information is not intended to replace advice given to you by your health care provider. Make sure you discuss any questions you have with your health care provider. Document Released: 04/04/2011 Document Revised: 02/25/2016 Document Reviewed: 06/23/2015 Elsevier Interactive Patient Education  2017 [employer].

## 2017-01-20 NOTE — Assessment & Plan Note (Signed)
Checking AFP and LFTs for stability.

## 2017-01-23 LAB — AFP TUMOR MARKER: AFP-Tumor Marker: 8.4 ng/mL — ABNORMAL HIGH (ref <6.1)

## 2017-01-26 ENCOUNTER — Other Ambulatory Visit: Payer: Self-pay | Admitting: Internal Medicine

## 2017-01-26 DIAGNOSIS — Z1239 Encounter for other screening for malignant neoplasm of breast: Secondary | ICD-10-CM

## 2017-01-26 DIAGNOSIS — Z1231 Encounter for screening mammogram for malignant neoplasm of breast: Secondary | ICD-10-CM

## 2017-02-06 ENCOUNTER — Ambulatory Visit (INDEPENDENT_AMBULATORY_CARE_PROVIDER_SITE_OTHER): Payer: Medicare HMO | Admitting: Family Medicine

## 2017-02-06 ENCOUNTER — Encounter: Payer: Self-pay | Admitting: Family Medicine

## 2017-02-06 VITALS — BP 148/68 | HR 88 | Temp 99.1°F | Resp 17 | Ht 62.0 in | Wt 140.0 lb

## 2017-02-06 DIAGNOSIS — J40 Bronchitis, not specified as acute or chronic: Secondary | ICD-10-CM | POA: Insufficient documentation

## 2017-02-06 DIAGNOSIS — J069 Acute upper respiratory infection, unspecified: Secondary | ICD-10-CM

## 2017-02-06 MED ORDER — BENZONATATE 100 MG PO CAPS: 100.0000 mg | ORAL_CAPSULE | Freq: Two times a day (BID) | ORAL | 0 refills | Status: DC | PRN

## 2017-02-06 NOTE — Progress Notes (Signed)
  Terry Edwards - 81 y.o. female MRN [id number]  Date of birth: May 12, 1936  SUBJECTIVE:  Including CC & ROS.  Chief Complaint  Patient presents with  . Cough    onset 4 days  . Nasal Congestion   Ms. Terry Edwards is a 81 yo F that is presenting with URI symptoms. They have been present for 4 days. She has just gotten back from [place]. She is having sinus congestion and coughing. She has taken mucinex. She denies any leg swelling or redness. Having some yellow sputum.   ROS: No unexpected weight loss, fever, chills, swelling, instability, muscle pain, numbness/tingling, redness, otherwise see HPI   HISTORY: Past Medical, Surgical, Social, and Family History Reviewed & Updated per EMR.   Pertinent Historical Findings include: PMHx - HLD, osteopenia, chronic hep b  PSx-  Appendectomy, tonsillectomy  PSHx - no tobacco use   FHx -  HTn   PHYSICAL EXAM:  VS: BP (!) 148/68 (BP Location: Right Arm, Patient Position: Sitting, Cuff Size: Normal)   Pulse 88   Temp 99.1 F (37.3 C) (Oral)   Resp 17   Ht 5\' 2"  (1.575 m)   Wt 140 lb (63.5 kg)   SpO2 93%   BMI 25.61 kg/m  PHYSICAL EXAM: Gen: NAD, alert, cooperative with exam, well-appearing HEENT: NCAT, EOMI, clear conjunctiva, oropharynx clear, supple neck, TM's clear and intact b/l , no cervical LAD, Turbinates erythematous and edematous,  CV: RRR, good S1/S2, no murmur, no edema, capillary refill brisk  Resp: CTABL, no wheezes, non-labored Skin: no rashes, normal turgor  Neuro: no gross deficits.  Psych: good insight, alert and oriented MSK: normal strength, normal ambulation   ASSESSMENT & PLAN:   Upper respiratory tract infection Likely related to viral illness. - supportive care  - tessalon  - advised to follow up if symptoms worse for consideration of chest xray

## 2017-02-06 NOTE — Assessment & Plan Note (Signed)
Likely related to viral illness. - supportive care  - tessalon  - advised to follow up if symptoms worse for consideration of chest xray

## 2017-02-06 NOTE — Patient Instructions (Addendum)
  Thank you for coming in,   You can try things such as zyrtec-D or allegra-D which is an antihistamine and decongestant.   You can try afrin which will help with nasal congestion but use for only three days.   You can also try using a netti pot on a regular occasion.  I have sent in tessalon for you cough.   Please follow up if your cough gets worse, you develop a fever or have leg swelling and redness.    Please feel free to call with any questions or concerns at any time, at [phone]. --Dr. Raeford Razor    IF you received an x-ray today, you will receive an invoice from [facility]. Please contact [facility] at [phone] with questions or concerns regarding your invoice.   IF you received labwork today, you will receive an invoice from [facility]. Please contact [facility] at [phone] with questions or concerns regarding your invoice.   Our billing staff will not be able to assist you with questions regarding bills from these companies.  You will be contacted with the lab results as soon as they are available. The fastest way to get your results is to activate your My Chart account. Instructions are located on the last page of this paperwork. If you have not heard from [place] regarding the results in 2 weeks, please contact this office.

## 2017-02-09 ENCOUNTER — Telehealth: Payer: Self-pay

## 2017-02-09 NOTE — Telephone Encounter (Signed)
Entered request for benzonatate at cover my meds.  It said to allow 5 business days for approval.

## 2017-02-13 ENCOUNTER — Ambulatory Visit (INDEPENDENT_AMBULATORY_CARE_PROVIDER_SITE_OTHER): Payer: Medicare HMO | Admitting: Internal Medicine

## 2017-02-13 ENCOUNTER — Encounter: Payer: Self-pay | Admitting: Internal Medicine

## 2017-02-13 DIAGNOSIS — J40 Bronchitis, not specified as acute or chronic: Secondary | ICD-10-CM | POA: Diagnosis not present

## 2017-02-13 MED ORDER — DOXYCYCLINE HYCLATE 100 MG PO TABS
100.0000 mg | ORAL_TABLET | Freq: Two times a day (BID) | ORAL | 0 refills | Status: DC
Start: 2017-02-13 — End: 2017-03-20

## 2017-02-13 MED ORDER — HYDROCODONE-HOMATROPINE 5-1.5 MG/5ML PO SYRP: 5.0000 mL | ORAL_SOLUTION | Freq: Three times a day (TID) | ORAL | 0 refills | Status: DC | PRN

## 2017-02-13 NOTE — Progress Notes (Signed)
   Subjective:    Patient ID: Terry Edwards [place], female    DOB: 22-Feb-1936, 81 y.o.   MRN: [id number]  HPI The patient is a 81 YO female coming in for cough and cold symptoms for about 12 days now. 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 She came home and had worsening with fevers and chills. Seen in urgent care and given tessalon perles and advised to use mucinex which she has been taking. She denies tessalon perles helping. She is having SOB and change in activity. She is having some nose drainage and mild ear pressure. No sinus pressure or headaches.She is not taking allergy medication right now as she normally does not have allergies. Still coughing up yellow sputum. Mild chills several times over the last week. Not improving and worsening slightly.   Review of Systems  Constitutional: Positive for activity change, appetite change, chills and fatigue. Negative for fever and unexpected weight change.  HENT: Positive for congestion, ear discharge and postnasal drip. Negative for ear pain, rhinorrhea, sinus pain, sinus pressure, sore throat and trouble swallowing.   Eyes: Negative.   Respiratory: Positive for cough, chest tightness and shortness of breath. Negative for wheezing.   Cardiovascular: Negative.   Gastrointestinal: Negative.   Musculoskeletal: Negative.   Neurological: Negative.       Objective:   Physical Exam  Constitutional: She is oriented to person, place, and time. She appears well-developed and well-nourished.  HENT:  Head: Normocephalic and atraumatic.  Right Ear: External ear normal.  Left Ear: External ear normal.  Oropharynx with redness and clear drainage, nose with swollen turbinates  Eyes: EOM are normal. Pupils are equal, round, and reactive to light.  Neck: Normal range of motion. No JVD present.  Cardiovascular: Normal rate and regular rhythm.   Pulmonary/Chest: Effort normal. No respiratory distress. She has no wheezes. She has no rales.  Scattered  rhonchi which partially clear with cough  Abdominal: Soft. She exhibits no distension. There is no tenderness. There is no rebound.  Lymphadenopathy:    She has no cervical adenopathy.  Neurological: She is alert and oriented to person, place, and time.  Skin: Skin is warm and dry.   Vitals:   02/13/17 1343  BP: 130/70  Pulse: 83  Resp: 12  Temp: 98.7 F (37.1 C)  TempSrc: Oral  SpO2: 98%  Weight: 137 lb (62.1 kg)  Height: 5\' 2"  (1.575 m)      Assessment & Plan:

## 2017-02-13 NOTE — Patient Instructions (Signed)
We have sent in the antibiotic called doxycycline. Take 1 pill twice a day for 1 week.  We have given you the cough medicine to use at night time.

## 2017-02-13 NOTE — Assessment & Plan Note (Signed)
Rx for doxycycline for course and hycodan cough syrup since tessalon perles ineffective.

## 2017-02-14 ENCOUNTER — Ambulatory Visit
Admission: RE | Admit: 2017-02-14 | Discharge: 2017-02-14 | Disposition: A | Discharge status: Home / Self Care | Payer: Medicare HMO | Source: Ambulatory Visit | Attending: Internal Medicine | Admitting: Internal Medicine

## 2017-02-14 DIAGNOSIS — Z1231 Encounter for screening mammogram for malignant neoplasm of breast: Secondary | ICD-10-CM

## 2017-02-14 DIAGNOSIS — M85851 Other specified disorders of bone density and structure, right thigh: Secondary | ICD-10-CM | POA: Diagnosis not present

## 2017-02-14 DIAGNOSIS — Z780 Asymptomatic menopausal state: Secondary | ICD-10-CM | POA: Diagnosis not present

## 2017-02-14 DIAGNOSIS — M810 Age-related osteoporosis without current pathological fracture: Secondary | ICD-10-CM | POA: Diagnosis not present

## 2017-02-14 DIAGNOSIS — E2839 Other primary ovarian failure: Secondary | ICD-10-CM

## 2017-02-22 ENCOUNTER — Telehealth: Payer: Self-pay

## 2017-02-22 NOTE — Telephone Encounter (Signed)
Checked on status of cover my meds application.  It was still being reviewed by pharmacy.

## 2017-02-25 NOTE — Telephone Encounter (Signed)
Called patient. Not taking tessalon - received different med form another visit.  No further ppwk needed.

## 2017-03-20 ENCOUNTER — Ambulatory Visit (INDEPENDENT_AMBULATORY_CARE_PROVIDER_SITE_OTHER): Payer: Medicare HMO | Admitting: Family Medicine

## 2017-03-20 ENCOUNTER — Encounter: Payer: Self-pay | Admitting: Family Medicine

## 2017-03-20 VITALS — BP 134/80 | HR 78 | Temp 98.5°F | Wt 137.5 lb

## 2017-03-20 DIAGNOSIS — L989 Disorder of the skin and subcutaneous tissue, unspecified: Secondary | ICD-10-CM

## 2017-03-20 NOTE — Progress Notes (Signed)
   Subjective:    Patient ID: Terry Edwards [place], female    DOB: 04-Mar-1936, 81 y.o.   MRN: [id number]  HPI This is a 81 yo female who presents today with spot on left forearm, has been getting bigger and more raised over last week. No history of skin problems. Has seen Dr. Elvera Lennox at [facility] in the past.   Past Medical History:  Diagnosis Date  . Chronic hepatitis B without hepatic coma (HCC) 1970s   chronic elev AFP  . 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 Hemorrhoids   . History of chicken pox   . History of depression 2006   situational, on Lexapro for 1 year  . Hyperlipidemia   . Osteopenia    hx fx 5th MT R foot 03/2013  . Right rotator cuff tendonitis 2013   resolved with PT (injections not helpful)  . Urine incontinence   . Vitamin D deficiency    Past Surgical History:  Procedure Laterality Date  . APPENDECTOMY  1957  . EYE SURGERY Bilateral 03/09/15, 03/23/15   cataract removal  . TONSILLECTOMY  1940's   Family History  Problem Relation Age of Onset  . Hypertension Mother   . Hyperlipidemia Mother   . Myelodysplastic syndrome Mother   . Hypertension Father   . Hyperlipidemia Father   . Diabetes Father   . Breast cancer Neg Hx    Social History  Substance Use Topics  . Smoking status: Never Smoker  . Smokeless tobacco: Never Used     Comment: widowed, lives alone. 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 Alcohol use Yes      Review of Systems Per HPI    Objective:   Physical Exam  Constitutional: She is oriented to person, place, and time. She appears well-developed and well-nourished. No distress.  HENT:  Head: Normocephalic and atraumatic.  Eyes: Conjunctivae are normal.  Cardiovascular: Normal rate.   Pulmonary/Chest: Effort normal.  Neurological: She is alert and oriented to person, place, and time.  Skin: Skin is warm and dry. Lesion (Right forearm with 1.5 cm ovoid, hyperpigmented, raised lesion. Slightly  darker along medial edge. No drainage/scaling. ) noted. She is not diaphoretic.  Vitals reviewed.   BP 134/80 (BP Location: Right Arm, Patient Position: Sitting, Cuff Size: Normal)   Pulse 78   Temp 98.5 F (36.9 C) (Oral)   Wt 137 lb 8 oz (62.4 kg)   SpO2 96%   BMI 25.15 kg/m      Assessment & Plan:  1. Skin lesion of right arm - given size and recent change, will have her see her dermatologist. She will call Dr. Juanna Cao office to schedule.  - she will let me know if she requires anything further   Clarene Reamer, FNP-BC  [facility] [place], [place]  03/20/2017 3:50 PM

## 2017-03-20 NOTE — Patient Instructions (Addendum)
Please contact Dr. Durward Fortes to schedule an appointment, his number is [phone]

## 2017-03-21 ENCOUNTER — Telehealth: Payer: Self-pay | Admitting: Family Medicine

## 2017-03-21 NOTE — Telephone Encounter (Signed)
Pt was seen by Jackelyn Poling in [place] office yesterday, the Pts has called to try to get an appt with a dermatologists and the soonest is a month away, can a referral be put in to try to get her an appointment quicker? Thanks

## 2017-03-23 ENCOUNTER — Other Ambulatory Visit: Payer: Self-pay | Admitting: Family Medicine

## 2017-03-23 DIAGNOSIS — L989 Disorder of the skin and subcutaneous tissue, unspecified: Secondary | ICD-10-CM

## 2017-03-23 NOTE — Telephone Encounter (Signed)
Please call patient and let her know that I put in a referral to dermatology.

## 2017-03-23 NOTE — Telephone Encounter (Signed)
Debbie, please see message.

## 2017-03-23 NOTE — Telephone Encounter (Signed)
Spoke to pt, told her Terry Edwards put the order in for referral to Dermatology and someone will contact to schedule an appointment. Pt verbalized understanding.

## 2017-04-20 DIAGNOSIS — D225 Melanocytic nevi of trunk: Secondary | ICD-10-CM | POA: Diagnosis not present

## 2017-04-20 DIAGNOSIS — L821 Other seborrheic keratosis: Secondary | ICD-10-CM | POA: Diagnosis not present

## 2017-04-20 DIAGNOSIS — L814 Other melanin hyperpigmentation: Secondary | ICD-10-CM | POA: Diagnosis not present

## 2017-04-20 DIAGNOSIS — D1801 Hemangioma of skin and subcutaneous tissue: Secondary | ICD-10-CM | POA: Diagnosis not present

## 2017-04-20 DIAGNOSIS — L820 Inflamed seborrheic keratosis: Secondary | ICD-10-CM | POA: Diagnosis not present

## 2017-05-09 DIAGNOSIS — H524 Presbyopia: Secondary | ICD-10-CM | POA: Diagnosis not present

## 2017-05-22 DIAGNOSIS — R69 Illness, unspecified: Secondary | ICD-10-CM | POA: Diagnosis not present

## 2017-06-27 ENCOUNTER — Ambulatory Visit (INDEPENDENT_AMBULATORY_CARE_PROVIDER_SITE_OTHER)
Admission: RE | Admit: 2017-06-27 | Discharge: 2017-06-27 | Disposition: A | Discharge status: Home / Self Care | Payer: Medicare HMO | Source: Ambulatory Visit | Attending: Internal Medicine | Admitting: Internal Medicine

## 2017-06-27 ENCOUNTER — Ambulatory Visit (INDEPENDENT_AMBULATORY_CARE_PROVIDER_SITE_OTHER): Payer: Medicare HMO | Admitting: Internal Medicine

## 2017-06-27 ENCOUNTER — Encounter: Payer: Self-pay | Admitting: Internal Medicine

## 2017-06-27 VITALS — BP 100/62 | HR 65 | Temp 98.1°F | Ht 62.0 in | Wt 136.0 lb

## 2017-06-27 DIAGNOSIS — G8929 Other chronic pain: Secondary | ICD-10-CM

## 2017-06-27 DIAGNOSIS — M79674 Pain in right toe(s): Secondary | ICD-10-CM

## 2017-06-27 DIAGNOSIS — M19071 Primary osteoarthritis, right ankle and foot: Secondary | ICD-10-CM | POA: Diagnosis not present

## 2017-06-27 NOTE — Patient Instructions (Signed)
We will check the x-ray of the foot.   You can try taking turmeric or glucosamine to help arthritis.   Tylenol or aleve are the safest things to try for pain if needed before hiking or on bad days.

## 2017-06-27 NOTE — Assessment & Plan Note (Signed)
X-ray of the right foot to rule out stress fracture. Encouraged to use tylenol or turmeric or glucosamine for pain.

## 2017-06-27 NOTE — Progress Notes (Signed)
   Subjective:    Patient ID: Terry Edwards, female    DOB: Mar 06, 1936, 81 y.o.   MRN: [id number]  HPI The patient is a 81 YO female coming in for right great toe pain. Started about 1-2 months ago. She denies injury or overuse prior to pain. Rates 4/10 pain. She denies redness. Some swelling intermittent but not now. She denies taking anything for the pain. Tried icing it once and this helped. She is not able to hike now due to the pain.   Review of Systems  Constitutional: Positive for activity change. Negative for appetite change, fatigue, fever and unexpected weight change.  Respiratory: Negative for cough, chest tightness and shortness of breath.   Cardiovascular: Negative for chest pain, palpitations and leg swelling.  Gastrointestinal: Negative for abdominal distention, abdominal pain, constipation, diarrhea, nausea and vomiting.  Musculoskeletal: Positive for arthralgias. Negative for back pain, gait problem, joint swelling and neck pain.  Skin: Negative.   Neurological: Negative.       Objective:   Physical Exam  Constitutional: She is oriented to person, place, and time. She appears well-developed and well-nourished.  HENT:  Head: Normocephalic and atraumatic.  Eyes: EOM are normal.  Neck: Normal range of motion.  Cardiovascular: Normal rate and regular rhythm.   Pulmonary/Chest: Effort normal and breath sounds normal. No respiratory distress. She has no wheezes. She has no rales.  Abdominal: Soft. Bowel sounds are normal. She exhibits no distension. There is no tenderness. There is no rebound.  Musculoskeletal: She exhibits tenderness. She exhibits no edema.  Pain at the MTP joint of the right great toe, no swelling or redness. No pain with eversion or inversion.   Neurological: She is alert and oriented to person, place, and time. Coordination normal.  Skin: Skin is warm and dry.  Psychiatric: She has a normal mood and affect.    Vitals:   06/27/17 1113  BP: 100/62    Pulse: 65  Temp: 98.1 F (36.7 C)  TempSrc: Oral  SpO2: 99%  Weight: 136 lb (61.7 kg)  Height: 5\' 2"  (1.575 m)      Assessment & Plan:

## 2017-06-29 ENCOUNTER — Ambulatory Visit: Payer: Medicare HMO | Admitting: Internal Medicine

## 2017-06-29 DIAGNOSIS — R69 Illness, unspecified: Secondary | ICD-10-CM | POA: Diagnosis not present

## 2017-07-13 ENCOUNTER — Ambulatory Visit (INDEPENDENT_AMBULATORY_CARE_PROVIDER_SITE_OTHER): Payer: Medicare HMO | Admitting: General Practice

## 2017-07-13 DIAGNOSIS — Z23 Encounter for immunization: Secondary | ICD-10-CM

## 2017-10-20 DIAGNOSIS — M79671 Pain in right foot: Secondary | ICD-10-CM | POA: Diagnosis not present

## 2017-10-20 DIAGNOSIS — M2021 Hallux rigidus, right foot: Secondary | ICD-10-CM | POA: Diagnosis not present

## 2017-10-23 ENCOUNTER — Ambulatory Visit (INDEPENDENT_AMBULATORY_CARE_PROVIDER_SITE_OTHER): Payer: Medicare HMO | Admitting: Internal Medicine

## 2017-10-23 ENCOUNTER — Encounter: Payer: Self-pay | Admitting: Internal Medicine

## 2017-10-23 ENCOUNTER — Other Ambulatory Visit: Payer: Self-pay

## 2017-10-23 DIAGNOSIS — J40 Bronchitis, not specified as acute or chronic: Secondary | ICD-10-CM | POA: Diagnosis not present

## 2017-10-23 MED ORDER — DOXYCYCLINE HYCLATE 100 MG PO TABS: 100.0000 mg | ORAL_TABLET | Freq: Two times a day (BID) | ORAL | 0 refills | Status: DC

## 2017-10-23 NOTE — Patient Instructions (Signed)
We have sent in the doxycycline to the pharmacy. Take 1 pill twice a day for 1 week. It is okay to start zyrtec for the allergies as well to help dry up the drainage.

## 2017-10-23 NOTE — Assessment & Plan Note (Signed)
Rx for doxycycline. Still has some cough medicine left from last year. Advised to continue her allergy medicine otc. Can use advil as needed for fevers or chills.

## 2017-10-23 NOTE — Progress Notes (Signed)
   Subjective:    Patient ID: Terry Edwards, female    DOB: [birth year], 82 y.o.   MRN: [id number]  HPI The patient is a 82 YO female coming in for cough and drainage. Ongoing for about 1 week or so. She denies fevers but is having chills. She has taken advil for the chills and this seemed to help. She is still taking some otc allergy medication. Does have some SOB and coughing up green and yellow sputum. Some drainage and congestion with sinus pressure. Mild sore throat. Some swelling in her neck. Overall worsening since onset.   Review of Systems  Constitutional: Positive for activity change, appetite change and chills. Negative for fatigue, fever and unexpected weight change.  HENT: Positive for congestion, postnasal drip, rhinorrhea and sinus pressure. Negative for ear discharge, ear pain, sinus pain, sneezing, sore throat, tinnitus, trouble swallowing and voice change.   Eyes: Negative.   Respiratory: Positive for cough. Negative for chest tightness, shortness of breath and wheezing.   Cardiovascular: Negative.   Gastrointestinal: Negative.   Musculoskeletal: Positive for myalgias.  Neurological: Negative.       Objective:   Physical Exam  Constitutional: She is oriented to person, place, and time. She appears well-developed and well-nourished.  HENT:  Head: Normocephalic and atraumatic.  Oropharynx with redness and clear drainage, nose with swollen turbinates, TMs normal bilaterally, frontal sinus pressure.   Eyes: EOM are normal.  Neck: Normal range of motion. No thyromegaly present.  Cardiovascular: Normal rate and regular rhythm.  Pulmonary/Chest: Effort normal and breath sounds normal. No respiratory distress. She has no wheezes. She has no rales.  Some rhonchi which partially clear with cough  Abdominal: Soft.  Musculoskeletal: She exhibits tenderness.  Lymphadenopathy:    She has cervical adenopathy.  Neurological: She is alert and oriented to person, place, and time.    Skin: Skin is warm and dry.   Vitals:   10/23/17 1356  BP: 104/74  Pulse: 74  Temp: 98.6 F (37 C)  TempSrc: Oral  SpO2: 99%  Weight: 139 lb (63 kg)  Height: 5\' 2"  (1.575 m)      Assessment & Plan:

## 2017-11-27 DIAGNOSIS — R69 Illness, unspecified: Secondary | ICD-10-CM | POA: Diagnosis not present

## 2017-12-07 DIAGNOSIS — Z809 Family history of malignant neoplasm, unspecified: Secondary | ICD-10-CM | POA: Diagnosis not present

## 2017-12-07 DIAGNOSIS — Z833 Family history of diabetes mellitus: Secondary | ICD-10-CM | POA: Diagnosis not present

## 2017-12-07 DIAGNOSIS — K08409 Partial loss of teeth, unspecified cause, unspecified class: Secondary | ICD-10-CM | POA: Diagnosis not present

## 2017-12-07 DIAGNOSIS — Z818 Family history of other mental and behavioral disorders: Secondary | ICD-10-CM | POA: Diagnosis not present

## 2017-12-07 DIAGNOSIS — Z8249 Family history of ischemic heart disease and other diseases of the circulatory system: Secondary | ICD-10-CM | POA: Diagnosis not present

## 2017-12-07 DIAGNOSIS — J309 Allergic rhinitis, unspecified: Secondary | ICD-10-CM | POA: Diagnosis not present

## 2017-12-07 DIAGNOSIS — R69 Illness, unspecified: Secondary | ICD-10-CM | POA: Diagnosis not present

## 2017-12-07 DIAGNOSIS — H547 Unspecified visual loss: Secondary | ICD-10-CM | POA: Diagnosis not present

## 2018-01-22 ENCOUNTER — Encounter: Payer: Self-pay | Admitting: Internal Medicine

## 2018-01-22 ENCOUNTER — Ambulatory Visit (INDEPENDENT_AMBULATORY_CARE_PROVIDER_SITE_OTHER): Payer: Medicare HMO | Admitting: Internal Medicine

## 2018-01-22 VITALS — BP 128/78 | HR 76 | Temp 97.9°F | Ht 62.0 in | Wt 136.0 lb

## 2018-01-22 DIAGNOSIS — B181 Chronic viral hepatitis B without delta-agent: Secondary | ICD-10-CM

## 2018-01-22 DIAGNOSIS — E559 Vitamin D deficiency, unspecified: Secondary | ICD-10-CM | POA: Diagnosis not present

## 2018-01-22 DIAGNOSIS — Z0000 Encounter for general adult medical examination without abnormal findings: Secondary | ICD-10-CM

## 2018-01-22 DIAGNOSIS — E538 Deficiency of other specified B group vitamins: Secondary | ICD-10-CM

## 2018-01-22 MED ORDER — ZOSTER VAC RECOMB ADJUVANTED 50 MCG/0.5ML IM SUSR: 0.5000 mL | Freq: Once | INTRAMUSCULAR | 1 refills | Status: AC

## 2018-01-22 NOTE — Assessment & Plan Note (Signed)
Given rx for shingrix, flu and tetanus up to date. Pneumonia series completed. Cologuard ordered today. Mammogram up to date and dexa due next year. Sun safety and mole surveillance counseling given. Given 10 year screening recommendations.

## 2018-01-22 NOTE — Assessment & Plan Note (Signed)
Checking AFP and LFTs.

## 2018-01-22 NOTE — Progress Notes (Signed)
   Subjective:    Patient ID: Terry Edwards, female    DOB: 12-09-1935, 82 y.o.   MRN: [id number]  HPI Here for medicare wellness and physical, no new complaints. Please see A/P for status and treatment of chronic medical problems.   Diet: heart healthy Physical activity: active, silver sneakers Depression/mood screen: negative Hearing: mild loss Visual acuity: grossly normal, performs annual eye exam  ADLs: capable Fall risk: none Home safety: good Cognitive evaluation: intact to orientation, naming, recall and repetition EOL planning: adv directives discussed, in place  I have personally reviewed and have noted 1. The patient's medical and social history - reviewed today no changes 2. Their use of alcohol, tobacco or illicit drugs 3. Their current medications and supplements 4. The patient's functional ability including ADL's, fall risks, home safety risks and hearing or visual impairment. 5. Diet and physical activities 6. Evidence for depression or mood disorders 7. Care team reviewed and updated (available in snapshot)  Review of Systems  Constitutional: Negative.   HENT: Negative.   Eyes: Negative.   Respiratory: Negative for cough, chest tightness and shortness of breath.   Cardiovascular: Negative for chest pain, palpitations and leg swelling.  Gastrointestinal: Negative for abdominal distention, abdominal pain, constipation, diarrhea, nausea and vomiting.  Musculoskeletal: Negative.   Skin: Negative.   Neurological: Negative.   Psychiatric/Behavioral: Negative.       Objective:   Physical Exam  Constitutional: She is oriented to person, place, and time. She appears well-developed and well-nourished.  HENT:  Head: Normocephalic and atraumatic.  Eyes: EOM are normal.  Neck: Normal range of motion.  Cardiovascular: Normal rate and regular rhythm.  Pulmonary/Chest: Effort normal and breath sounds normal. No respiratory distress. She has no wheezes. She has no  rales.  Abdominal: Soft. Bowel sounds are normal. She exhibits no distension. There is no tenderness. There is no rebound.  Musculoskeletal: She exhibits no edema.  Neurological: She is alert and oriented to person, place, and time. Coordination normal.  Skin: Skin is warm and dry.  Psychiatric: She has a normal mood and affect.   Vitals:   01/22/18 0759  BP: 128/78  Pulse: 76  Temp: 97.9 F (36.6 C)  TempSrc: Oral  SpO2: 96%  Weight: 136 lb (61.7 kg)  Height: 5\' 2"  (1.575 m)      Assessment & Plan:

## 2018-01-22 NOTE — Assessment & Plan Note (Signed)
Checking vitamin D level.  

## 2018-01-22 NOTE — Patient Instructions (Signed)

## 2018-01-23 ENCOUNTER — Other Ambulatory Visit (INDEPENDENT_AMBULATORY_CARE_PROVIDER_SITE_OTHER): Payer: Medicare HMO

## 2018-01-23 DIAGNOSIS — Z0000 Encounter for general adult medical examination without abnormal findings: Secondary | ICD-10-CM

## 2018-01-23 DIAGNOSIS — B181 Chronic viral hepatitis B without delta-agent: Secondary | ICD-10-CM

## 2018-01-23 DIAGNOSIS — E538 Deficiency of other specified B group vitamins: Secondary | ICD-10-CM

## 2018-01-23 DIAGNOSIS — E559 Vitamin D deficiency, unspecified: Secondary | ICD-10-CM

## 2018-01-23 LAB — CBC
HCT: 41.1 % (ref 36.0–46.0)
Hemoglobin: 13.8 g/dL (ref 12.0–15.0)
MCHC: 33.7 g/dL (ref 30.0–36.0)
MCV: 91.3 fl (ref 78.0–100.0)
PLATELETS: 230 10*3/uL (ref 150.0–400.0)
RBC: 4.5 Mil/uL (ref 3.87–5.11)
RDW: 13.7 % (ref 11.5–15.5)
WBC: 5.2 10*3/uL (ref 4.0–10.5)

## 2018-01-23 LAB — COMPREHENSIVE METABOLIC PANEL
ALT: 13 U/L (ref 0–35)
AST: 17 U/L (ref 0–37)
Albumin: 4.1 g/dL (ref 3.5–5.2)
Alkaline Phosphatase: 64 U/L (ref 39–117)
BILIRUBIN TOTAL: 0.6 mg/dL (ref 0.2–1.2)
BUN: 21 mg/dL (ref 6–23)
CALCIUM: 9.3 mg/dL (ref 8.4–10.5)
CO2: 27 meq/L (ref 19–32)
Chloride: 107 mEq/L (ref 96–112)
Creatinine, Ser: 0.67 mg/dL (ref 0.40–1.20)
GFR: 90.11 mL/min (ref >60.00)
Glucose, Bld: 87 mg/dL (ref 70–99)
Potassium: 3.9 mEq/L (ref 3.5–5.1)
Sodium: 140 mEq/L (ref 135–145)
Total Protein: 6.3 g/dL (ref 6.0–8.3)

## 2018-01-23 LAB — LIPID PANEL
CHOL/HDL RATIO: 3
Cholesterol: 224 mg/dL — ABNORMAL HIGH (ref 0–200)
HDL: 83.7 mg/dL (ref >39.00)
LDL Cholesterol: 129 mg/dL — ABNORMAL HIGH (ref 0–99)
NonHDL: 140.49
TRIGLYCERIDES: 58 mg/dL (ref 0.0–149.0)
VLDL: 11.6 mg/dL (ref 0.0–40.0)

## 2018-01-23 LAB — VITAMIN B12: Vitamin B-12: 434 pg/mL (ref 211–911)

## 2018-01-23 LAB — TSH: TSH: 2.8 u[IU]/mL (ref 0.35–4.50)

## 2018-01-23 LAB — VITAMIN D 25 HYDROXY (VIT D DEFICIENCY, FRACTURES): VITD: 47.57 ng/mL (ref 30.00–100.00)

## 2018-01-24 LAB — AFP TUMOR MARKER: AFP-Tumor Marker: 7.8 ng/mL — ABNORMAL HIGH

## 2018-01-30 DIAGNOSIS — Z1211 Encounter for screening for malignant neoplasm of colon: Secondary | ICD-10-CM | POA: Diagnosis not present

## 2018-01-30 LAB — COLOGUARD: Cologuard: NEGATIVE

## 2018-01-31 LAB — COLOGUARD: Cologuard: NEGATIVE

## 2018-02-14 NOTE — Progress Notes (Signed)
Abstracted and sent to scan Patient informed of results and stated understanding

## 2018-03-08 ENCOUNTER — Encounter: Payer: Self-pay | Admitting: Internal Medicine

## 2018-05-10 DIAGNOSIS — H524 Presbyopia: Secondary | ICD-10-CM | POA: Diagnosis not present

## 2018-06-11 DIAGNOSIS — R69 Illness, unspecified: Secondary | ICD-10-CM | POA: Diagnosis not present

## 2018-09-20 DIAGNOSIS — M2041 Other hammer toe(s) (acquired), right foot: Secondary | ICD-10-CM | POA: Diagnosis not present

## 2018-09-20 DIAGNOSIS — M2042 Other hammer toe(s) (acquired), left foot: Secondary | ICD-10-CM | POA: Diagnosis not present

## 2018-09-20 DIAGNOSIS — B351 Tinea unguium: Secondary | ICD-10-CM | POA: Diagnosis not present

## 2018-09-20 DIAGNOSIS — L602 Onychogryphosis: Secondary | ICD-10-CM | POA: Diagnosis not present

## 2018-09-27 ENCOUNTER — Encounter: Payer: Self-pay | Admitting: Internal Medicine

## 2018-09-27 ENCOUNTER — Ambulatory Visit (INDEPENDENT_AMBULATORY_CARE_PROVIDER_SITE_OTHER): Payer: Medicare HMO | Admitting: Internal Medicine

## 2018-09-27 DIAGNOSIS — R21 Rash and other nonspecific skin eruption: Secondary | ICD-10-CM | POA: Diagnosis not present

## 2018-09-27 MED ORDER — TRIAMCINOLONE ACETONIDE 0.1 % EX CREA: 1.0000 "application " | TOPICAL_CREAM | Freq: Two times a day (BID) | CUTANEOUS | 0 refills | Status: DC

## 2018-09-27 NOTE — Progress Notes (Signed)
   Subjective:   Patient ID: Terry Edwards, female    DOB: [birth year], 82 y.o.   MRN: [id number]  HPI The patient is an 82 YO female coming in for rash on her upper right back. Started some time ago and is intermittently itching. She has seen dermatology and had several areas frozen off but cannot tell if this is in the same place. Denies pain. Denies fevers or chills. Denies new medications or supplements. Overall worse in the last 2 weeks. Tried vaseline and aloe otc without improvement.  Review of Systems  Constitutional: Negative.   HENT: Negative.   Eyes: Negative.   Respiratory: Negative for cough, chest tightness and shortness of breath.   Cardiovascular: Negative for chest pain, palpitations and leg swelling.  Gastrointestinal: Negative for abdominal distention, abdominal pain, constipation, diarrhea, nausea and vomiting.  Musculoskeletal: Negative.   Skin: Positive for rash.  Neurological: Negative.   Psychiatric/Behavioral: Negative.     Objective:  Physical Exam Constitutional:      Appearance: She is well-developed.  HENT:     Head: Normocephalic and atraumatic.  Neck:     Musculoskeletal: Normal range of motion.  Cardiovascular:     Rate and Rhythm: Normal rate and regular rhythm.  Pulmonary:     Effort: Pulmonary effort is normal. No respiratory distress.     Breath sounds: Normal breath sounds. No wheezing or rales.  Abdominal:     General: Bowel sounds are normal. There is no distension.     Palpations: Abdomen is soft.     Tenderness: There is no abdominal tenderness. There is no rebound.  Skin:    General: Skin is warm and dry.     Findings: Rash present.     Comments: About 1 cm oblong with some scaling along the center with appearance that parts could have been frozen off in the past.   Neurological:     Mental Status: She is alert and oriented to person, place, and time.     Coordination: Coordination normal.     Vitals:   09/27/18 1509  BP: (!)  142/80  Pulse: 74  Temp: 98.5 F (36.9 C)  TempSrc: Oral  SpO2: 97%  Weight: 138 lb (62.6 kg)  Height: 5\' 2"  (1.575 m)    Assessment & Plan:

## 2018-09-27 NOTE — Patient Instructions (Signed)
We have sent in a cream to use twice a day on the rash.

## 2018-09-27 NOTE — Assessment & Plan Note (Signed)
Rx for triamcinolone cream for rash. Can use vaseline as well.

## 2018-10-08 DIAGNOSIS — B351 Tinea unguium: Secondary | ICD-10-CM | POA: Diagnosis not present

## 2018-10-16 DIAGNOSIS — B351 Tinea unguium: Secondary | ICD-10-CM | POA: Diagnosis not present

## 2018-10-16 DIAGNOSIS — M2041 Other hammer toe(s) (acquired), right foot: Secondary | ICD-10-CM | POA: Diagnosis not present

## 2018-10-16 DIAGNOSIS — M2042 Other hammer toe(s) (acquired), left foot: Secondary | ICD-10-CM | POA: Diagnosis not present

## 2019-01-24 ENCOUNTER — Encounter: Payer: Medicare HMO | Admitting: Internal Medicine

## 2019-02-04 ENCOUNTER — Encounter: Payer: Medicare HMO | Admitting: Internal Medicine

## 2019-03-08 ENCOUNTER — Telehealth: Payer: Self-pay

## 2019-03-08 NOTE — Telephone Encounter (Signed)
Tried calling patient to reschedule next Wednesday appointment to another day as Dr. Sharlet Salina is not in the office. Phone kept hanging up and couldn't leave a voicemail.

## 2019-03-13 ENCOUNTER — Encounter: Payer: Medicare HMO | Admitting: Internal Medicine

## 2019-03-18 ENCOUNTER — Other Ambulatory Visit (INDEPENDENT_AMBULATORY_CARE_PROVIDER_SITE_OTHER): Payer: Medicare HMO

## 2019-03-18 ENCOUNTER — Ambulatory Visit (INDEPENDENT_AMBULATORY_CARE_PROVIDER_SITE_OTHER)
Admission: RE | Admit: 2019-03-18 | Discharge: 2019-03-18 | Disposition: A | Discharge status: Home / Self Care | Payer: Medicare HMO | Source: Ambulatory Visit | Attending: Internal Medicine | Admitting: Internal Medicine

## 2019-03-18 ENCOUNTER — Encounter: Payer: Self-pay | Admitting: Internal Medicine

## 2019-03-18 ENCOUNTER — Other Ambulatory Visit: Payer: Self-pay

## 2019-03-18 ENCOUNTER — Ambulatory Visit (INDEPENDENT_AMBULATORY_CARE_PROVIDER_SITE_OTHER): Payer: Medicare HMO | Admitting: Internal Medicine

## 2019-03-18 VITALS — BP 140/70 | HR 69 | Temp 98.5°F | Ht 62.0 in | Wt 134.2 lb

## 2019-03-18 DIAGNOSIS — E2839 Other primary ovarian failure: Secondary | ICD-10-CM

## 2019-03-18 DIAGNOSIS — Z0000 Encounter for general adult medical examination without abnormal findings: Secondary | ICD-10-CM

## 2019-03-18 DIAGNOSIS — M8580 Other specified disorders of bone density and structure, unspecified site: Secondary | ICD-10-CM | POA: Diagnosis not present

## 2019-03-18 DIAGNOSIS — E559 Vitamin D deficiency, unspecified: Secondary | ICD-10-CM

## 2019-03-18 DIAGNOSIS — E785 Hyperlipidemia, unspecified: Secondary | ICD-10-CM

## 2019-03-18 LAB — LIPID PANEL
Cholesterol: 217 mg/dL — ABNORMAL HIGH (ref 0–200)
HDL: 76.9 mg/dL (ref >39.00)
LDL Cholesterol: 128 mg/dL — ABNORMAL HIGH (ref 0–99)
NonHDL: 140.07
Total CHOL/HDL Ratio: 3
Triglycerides: 62 mg/dL (ref 0.0–149.0)
VLDL: 12.4 mg/dL (ref 0.0–40.0)

## 2019-03-18 LAB — CBC
HCT: 39.3 % (ref 36.0–46.0)
Hemoglobin: 13.2 g/dL (ref 12.0–15.0)
MCHC: 33.7 g/dL (ref 30.0–36.0)
MCV: 92.1 fl (ref 78.0–100.0)
Platelets: 207 10*3/uL (ref 150.0–400.0)
RBC: 4.26 Mil/uL (ref 3.87–5.11)
RDW: 13.5 % (ref 11.5–15.5)
WBC: 5.8 10*3/uL (ref 4.0–10.5)

## 2019-03-18 LAB — COMPREHENSIVE METABOLIC PANEL
ALT: 11 U/L (ref 0–35)
AST: 16 U/L (ref 0–37)
Albumin: 4.3 g/dL (ref 3.5–5.2)
Alkaline Phosphatase: 61 U/L (ref 39–117)
BUN: 13 mg/dL (ref 6–23)
CO2: 28 mEq/L (ref 19–32)
Calcium: 9.3 mg/dL (ref 8.4–10.5)
Chloride: 106 mEq/L (ref 96–112)
Creatinine, Ser: 0.65 mg/dL (ref 0.40–1.20)
GFR: 87.55 mL/min (ref >60.00)
Glucose, Bld: 81 mg/dL (ref 70–99)
Potassium: 3.7 mEq/L (ref 3.5–5.1)
Sodium: 141 mEq/L (ref 135–145)
Total Bilirubin: 0.5 mg/dL (ref 0.2–1.2)
Total Protein: 6.5 g/dL (ref 6.0–8.3)

## 2019-03-18 LAB — VITAMIN D 25 HYDROXY (VIT D DEFICIENCY, FRACTURES): VITD: 63.9 ng/mL (ref 30.00–100.00)

## 2019-03-18 NOTE — Progress Notes (Signed)
Subjective:   Patient ID: Terry Edwards [place], female    DOB: [birth year], 83 y.o.   MRN: [id number]  HPI Here for medicare wellness and physical, no new complaints. Please see A/P for status and treatment of chronic medical problems.   Diet: heart healthy Physical activity: sedentary Depression/mood screen: negative Hearing: intact to whispered voice Visual acuity: grossly normal with lens, performs annual eye exam  ADLs: capable Fall risk: none Home safety: good Cognitive evaluation: intact to orientation, naming, recall and repetition EOL planning: adv directives discussed    Office Visit from 03/18/2019 in [facility]  PHQ-2 Total Score  0      I have personally reviewed and have noted 1. The patient's medical and social history - reviewed today no changes 2. Their use of alcohol, tobacco or illicit drugs 3. Their current medications and supplements 4. The patient's functional ability including ADL's, fall risks, home safety risks and hearing or visual impairment. 5. Diet and physical activities 6. Evidence for depression or mood disorders 7. Care team reviewed and updated  Patient Care Team: Hoyt Koch, MD as PCP - General (Internal Medicine) Past Medical History:  Diagnosis Date  . Chronic hepatitis B without hepatic coma (HCC) 1970s   chronic elev AFP  . 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 Hemorrhoids   . History of chicken pox   . History of depression 2006   situational, on Lexapro for 1 year  . Hyperlipidemia   . Osteopenia    hx fx 5th MT R foot 03/2013  . Right rotator cuff tendonitis 2013   resolved with PT (injections not helpful)  . Urine incontinence   . Vitamin D deficiency    Past Surgical History:  Procedure Laterality Date  . APPENDECTOMY  1957  . EYE SURGERY Bilateral 03/09/15, 03/23/15   cataract removal  . TONSILLECTOMY  1940's   Family History  Problem Relation Age of Onset  . Hypertension  Mother   . Hyperlipidemia Mother   . Myelodysplastic syndrome Mother   . Hypertension Father   . Hyperlipidemia Father   . Diabetes Father   . Breast cancer Neg Hx    Review of Systems  Constitutional: Negative.   HENT: Negative.   Eyes: Negative.   Respiratory: Negative for cough, chest tightness and shortness of breath.   Cardiovascular: Negative for chest pain, palpitations and leg swelling.  Gastrointestinal: Negative for abdominal distention, abdominal pain, constipation, diarrhea, nausea and vomiting.  Musculoskeletal: Negative.   Skin: Negative.   Neurological: Negative.   Psychiatric/Behavioral: Negative.     Objective:  Physical Exam Constitutional:      Appearance: She is well-developed.  HENT:     Head: Normocephalic and atraumatic.  Neck:     Musculoskeletal: Normal range of motion.  Cardiovascular:     Rate and Rhythm: Normal rate and regular rhythm.     Comments: Carotids without bruit Pulmonary:     Effort: Pulmonary effort is normal. No respiratory distress.     Breath sounds: Normal breath sounds. No wheezing or rales.  Abdominal:     General: Bowel sounds are normal. There is no distension.     Palpations: Abdomen is soft.     Tenderness: There is no abdominal tenderness. There is no rebound.  Skin:    General: Skin is warm and dry.  Neurological:     Mental Status: She is alert and oriented to person, place, and time.  Coordination: Coordination normal.     Vitals:   03/18/19 1437 03/18/19 1501  BP: (!) 160/80 140/70  Pulse: 69   Temp: 98.5 F (36.9 C)   TempSrc: Oral   SpO2: 97%   Weight: 134 lb 4 oz (60.9 kg)   Height: 5\' 2"  (1.575 m)     Assessment & Plan:

## 2019-03-18 NOTE — Patient Instructions (Signed)

## 2019-03-18 NOTE — Assessment & Plan Note (Signed)
Needs bone density and this is ordered.

## 2019-03-18 NOTE — Assessment & Plan Note (Signed)
Checking vitamin D level and adjust oral replacement as needed.

## 2019-03-18 NOTE — Assessment & Plan Note (Signed)
Checking lipid panel and adjust as needed.  

## 2019-03-18 NOTE — Assessment & Plan Note (Signed)
Flu shot reminded to get. Pneumonia complete. Shingrix counseled, declines. Tetanus due 2024. Colonoscopy aged out. Mammogram aged out, pap smear aged out and dexa ordered today. Counseled about sun safety and mole surveillance. Counseled about the dangers of distracted driving. Given 10 year screening recommendations.

## 2019-03-21 ENCOUNTER — Other Ambulatory Visit: Payer: Self-pay | Admitting: Internal Medicine

## 2019-03-21 DIAGNOSIS — B181 Chronic viral hepatitis B without delta-agent: Secondary | ICD-10-CM

## 2019-03-22 ENCOUNTER — Other Ambulatory Visit: Payer: Self-pay

## 2019-03-22 ENCOUNTER — Ambulatory Visit (INDEPENDENT_AMBULATORY_CARE_PROVIDER_SITE_OTHER): Payer: Medicare HMO | Admitting: Internal Medicine

## 2019-03-22 ENCOUNTER — Encounter: Payer: Self-pay | Admitting: Internal Medicine

## 2019-03-22 DIAGNOSIS — B181 Chronic viral hepatitis B without delta-agent: Secondary | ICD-10-CM

## 2019-03-22 DIAGNOSIS — M810 Age-related osteoporosis without current pathological fracture: Secondary | ICD-10-CM | POA: Diagnosis not present

## 2019-03-22 MED ORDER — ALENDRONATE SODIUM 70 MG PO TABS: 70.0000 mg | ORAL_TABLET | ORAL | 3 refills | Status: DC

## 2019-03-22 NOTE — Progress Notes (Signed)
Virtual Visit via Video Note  I connected with Terry Edwards [place] on 03/22/19 at  9:20 AM EDT by a video enabled telemedicine application and verified that I am speaking with the correct person using two identifiers.  The patient and the provider were at separate locations throughout the entire encounter.   I discussed the limitations of evaluation and management by telemedicine and the availability of in person appointments. The patient expressed understanding and agreed to proceed.  History of Present Illness: The patient is a 83 y.o. female with visit for concerns about chronic hepatitis b (diagnosed in the 90s with liver number abnormalities, she is not sure if this ever went away but liver numbers normalized, she is not currently on treatment, no recent imaging, gets afp yearly which have been stable) and liver cancer screening (previously she had been getting afp yearly, guidelines have changed, hep b active in the 90s, she has not had real follow up for this in years, lfts normal for years now, unclear if she still has chronic infection, denies prior iv drug use ever, no US abdomen since the 90s) and osteoporosis (recent bone density with t score -2.5 in the spine, previous was same for the spine, took fosamax or similar in the 90s until 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  Observations/Objective: Appearance: normal, breathing appears normal, casual grooming, abdomen does not appear distended, throat normal, memory normal, mental status is A and O times 3  Assessment and Plan: See problem oriented charting  Follow Up Instructions: rx fosamax, US abdomen every 6 months, check hep b tests to determine active or past infection to clarify  I discussed the assessment and treatment plan with the patient. The patient was provided an opportunity to  ask questions and all were answered. The patient agreed with the plan and demonstrated an understanding of the instructions.   The patient was advised to call back or seek an in-person evaluation if the symptoms worsen or if the condition fails to improve as anticipated.  Hoyt Koch, MD

## 2019-03-22 NOTE — Assessment & Plan Note (Signed)
Unclear history and getting US abdomen to check for [facility] and reviewed those guidelines for her today. Checking labs to clarify if she has chronic infection and if so the viral loads and status of this. If she still has she may benefit from review with ID to check if she meets criteria for treatment.

## 2019-03-22 NOTE — Assessment & Plan Note (Signed)
-  2.5 at the lumbar. FRAX score done and discussed with her today with 20% risk major fracture and 7% risk hip fracture which meets criteria for treatment. After discussion she would like to try fosamax weekly. Needs DEXA 2 years after start to evaluate.

## 2019-03-25 ENCOUNTER — Other Ambulatory Visit: Payer: Medicare HMO

## 2019-03-25 DIAGNOSIS — B181 Chronic viral hepatitis B without delta-agent: Secondary | ICD-10-CM | POA: Diagnosis not present

## 2019-03-29 ENCOUNTER — Other Ambulatory Visit: Payer: Self-pay

## 2019-03-29 ENCOUNTER — Encounter: Payer: Self-pay | Admitting: Family Medicine

## 2019-03-29 ENCOUNTER — Ambulatory Visit (INDEPENDENT_AMBULATORY_CARE_PROVIDER_SITE_OTHER): Payer: Medicare HMO | Admitting: Family Medicine

## 2019-03-29 ENCOUNTER — Ambulatory Visit: Payer: Self-pay

## 2019-03-29 VITALS — BP 138/78 | HR 73 | Resp 14

## 2019-03-29 DIAGNOSIS — M79671 Pain in right foot: Secondary | ICD-10-CM

## 2019-03-29 LAB — HEPATITIS B SURFACE ANTIBODY,QUALITATIVE: Hep B S Ab: REACTIVE — AB

## 2019-03-29 LAB — HEPATITIS B E ANTIBODY: Hep B E Ab: REACTIVE — AB

## 2019-03-29 LAB — HEPATITIS B DNA, ULTRAQUANTITATIVE, PCR
Hepatitis B DNA (Calc): <1 Log IU/mL
Hepatitis B DNA: <10 IU/mL

## 2019-03-29 LAB — HEPATITIS B CORE ANTIBODY, IGM: Hep B C IgM: NONREACTIVE

## 2019-03-29 LAB — HEPATITIS B SURFACE ANTIGEN: Hepatitis B Surface Ag: NONREACTIVE

## 2019-03-29 LAB — HEPATITIS B E ANTIGEN: Hep B E Ag: NONREACTIVE

## 2019-03-29 NOTE — Progress Notes (Signed)
Terry Edwards [place] - 83 y.o. female MRN [id number]  Date of birth: 1936/06/02  SUBJECTIVE:  Including CC & ROS.  No chief complaint on file.   Terry Edwards [place] is a 83 y.o. female that is right foot pain. She was walking a few days ago and had an inversion injury. Now having ecchymosis over the lateral dorsal midfoot. Pain is mild. Is localized to this area. Worse with walking.    Review of Systems  Constitutional: Negative for fever.  HENT: Negative for congestion.   Respiratory: Negative for cough.   Cardiovascular: Negative for chest pain.  Gastrointestinal: Negative for abdominal pain.  Musculoskeletal: Positive for arthralgias and gait problem.  Skin: Positive for color change.  Neurological: Negative for weakness.  Hematological: Negative for adenopathy.    HISTORY: Past Medical, Surgical, Social, and Family History Reviewed & Updated per EMR.   Pertinent Historical Findings include:  Past Medical History:  Diagnosis Date  . Chronic hepatitis B without hepatic coma (HCC) 1970s   chronic elev AFP  . 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 Hemorrhoids   . History of chicken pox   . History of depression 2006   situational, on Lexapro for 1 year  . Hyperlipidemia   . Osteopenia    hx fx 5th MT R foot 03/2013  . Right rotator cuff tendonitis 2013   resolved with PT (injections not helpful)  . Urine incontinence   . Vitamin D deficiency     Past Surgical History:  Procedure Laterality Date  . APPENDECTOMY  1957  . EYE SURGERY Bilateral 03/09/15, 03/23/15   cataract removal  . TONSILLECTOMY  1940's    No Known Allergies  Family History  Problem Relation Age of Onset  . Hypertension Mother   . Hyperlipidemia Mother   . Myelodysplastic syndrome Mother   . Hypertension Father   . Hyperlipidemia Father   . Diabetes Father   . Breast cancer Neg Hx      Social History   Socioeconomic History  . Marital status: Widowed    Spouse name: Not on file  .  Number of children: Not on file  . Years of education: Not on file  . Highest education level: Not on file  Occupational History  . Not on file  Social Needs  . Financial resource strain: Not on file  . Food insecurity    Worry: Not on file    Inability: Not on file  . Transportation needs    Medical: Not on file    Non-medical: Not on file  Tobacco Use  . Smoking status: Never Smoker  . Smokeless tobacco: Never Used  . Tobacco comment: widowed, lives alone. 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 Alcohol use: Yes  . Drug use: No  . Sexual activity: Not on file  Lifestyle  . Physical activity    Days per week: Not on file    Minutes per session: Not on file  . Stress: Not on file  Relationships  . Social Herbalist on phone: Not on file    Gets together: Not on file    Attends religious service: Not on file    Active member of club or organization: Not on file    Attends meetings of clubs or organizations: Not on file    Relationship status: Not on file  . Intimate partner violence    Fear of  current or ex partner: Not on file    Emotionally abused: Not on file    Physically abused: Not on file    Forced sexual activity: Not on file  Other Topics Concern  . Not on file  Social History Narrative  . Not on file     PHYSICAL EXAM:  VS: BP 138/78   Pulse 73   Resp 14   SpO2 98%  Physical Exam Gen: NAD, alert, cooperative with exam, well-appearing ENT: normal lips, normal nasal mucosa,  Eye: normal EOM, normal conjunctiva and lids CV:  no edema, +2 pedal pulses   Resp: no accessory muscle use, non-labored,  GI: no masses or tenderness, no hernia  Skin: no rashes, no areas of induration  Neuro: normal tone, normal sensation to touch Psych:  normal insight, alert and oriented MSK:  Right foot:  Ecchymosis over the lateral dorsal midfoot  TTP along the base of the 4th MT  Normal ankle ROM Normal  strength to resistance  Neurovascularly intact   Limited ultrasound: right foot:  Normal appearing peroneal tendons  Normal appearing base of the 5th  Mild effusion effusion near base of 4th to suggest a ligamentous injury  Summary: no fracture   Ultrasound and interpretation by Clearance Coots, MD       ASSESSMENT & PLAN:   Right foot pain Had injury and seems to be a foot sprain. No fracture appreciated on [place].  - counseled on supportive care - if no improvement consider imaging.

## 2019-03-29 NOTE — Patient Instructions (Signed)
Nice to meet you Please try ice and tylenol  Please wear a stiff soled   Please send me a message in MyChart with any questions or updates.  Please see me back in 2-3 weeks if no better .   --Dr. Raeford Razor

## 2019-04-01 ENCOUNTER — Encounter: Payer: Self-pay | Admitting: Internal Medicine

## 2019-04-01 DIAGNOSIS — M79671 Pain in right foot: Secondary | ICD-10-CM | POA: Insufficient documentation

## 2019-04-01 NOTE — Assessment & Plan Note (Signed)
Had injury and seems to be a foot sprain. No fracture appreciated on [place].  - counseled on supportive care - if no improvement consider imaging.

## 2019-04-22 ENCOUNTER — Other Ambulatory Visit: Payer: Medicare HMO

## 2019-05-15 DIAGNOSIS — H524 Presbyopia: Secondary | ICD-10-CM | POA: Diagnosis not present

## 2019-06-06 DIAGNOSIS — G5763 Lesion of plantar nerve, bilateral lower limbs: Secondary | ICD-10-CM | POA: Diagnosis not present

## 2019-06-13 DIAGNOSIS — M7752 Other enthesopathy of left foot: Secondary | ICD-10-CM | POA: Diagnosis not present

## 2019-06-13 DIAGNOSIS — G5763 Lesion of plantar nerve, bilateral lower limbs: Secondary | ICD-10-CM | POA: Diagnosis not present

## 2019-06-13 DIAGNOSIS — M7751 Other enthesopathy of right foot: Secondary | ICD-10-CM | POA: Diagnosis not present

## 2019-08-12 DIAGNOSIS — L570 Actinic keratosis: Secondary | ICD-10-CM | POA: Diagnosis not present

## 2019-08-12 DIAGNOSIS — L821 Other seborrheic keratosis: Secondary | ICD-10-CM | POA: Diagnosis not present

## 2019-08-12 DIAGNOSIS — L820 Inflamed seborrheic keratosis: Secondary | ICD-10-CM | POA: Diagnosis not present

## 2019-08-12 DIAGNOSIS — L720 Epidermal cyst: Secondary | ICD-10-CM | POA: Diagnosis not present

## 2019-08-12 DIAGNOSIS — D1801 Hemangioma of skin and subcutaneous tissue: Secondary | ICD-10-CM | POA: Diagnosis not present

## 2019-09-02 ENCOUNTER — Encounter: Payer: Self-pay | Admitting: Podiatry

## 2019-09-02 ENCOUNTER — Other Ambulatory Visit: Payer: Self-pay

## 2019-09-02 ENCOUNTER — Ambulatory Visit (INDEPENDENT_AMBULATORY_CARE_PROVIDER_SITE_OTHER): Payer: Medicare HMO

## 2019-09-02 ENCOUNTER — Ambulatory Visit: Payer: Medicare HMO | Admitting: Podiatry

## 2019-09-02 VITALS — BP 171/75

## 2019-09-02 DIAGNOSIS — B351 Tinea unguium: Secondary | ICD-10-CM

## 2019-09-02 DIAGNOSIS — M722 Plantar fascial fibromatosis: Secondary | ICD-10-CM

## 2019-09-02 NOTE — Patient Instructions (Signed)

## 2019-09-02 NOTE — Progress Notes (Signed)
Subjective:   Patient ID: Terry Edwards [place], female   DOB: 83 y.o.   MRN: [id number]   HPI Patient presents stating she has been having a lot of problems with the bottom of her left heel and she is very active and likes to hike and do other activities.  States it seems to be more on the bottom outside of the heel that bothers her and was also concerned about some discoloration of the nailbeds.  Patient does not smoke and likes to be active   Review of Systems  All other systems reviewed and are negative.       Objective:  Physical Exam Vitals signs and nursing note reviewed.  Constitutional:      Appearance: She is well-developed.  Pulmonary:     Effort: Pulmonary effort is normal.  Musculoskeletal: Normal range of motion.  Skin:    General: Skin is warm.  Neurological:     Mental Status: She is alert.     Neurovascular status intact muscle strength found to be adequate range of motion within normal limits with patient noted to have discomfort in the plantar lateral aspect and into the center aspect of the left fascia at its insertion calcaneus.  Slight discoloration of the hallux nail beds bilateral that she uses topical medicine on     Assessment:  Plantar fasciitis most likely left heel with mycotic nail infection bilateral     Plan:  H&P reviewed both conditions and today from the lateral side I did sterile prep and injected the fascia 3 mg Kenalog 5 mg Xylocaine and then went ahead and applied fascial brace to lift up the arch.  Do not recommend any other kind of treatment currently for nail disease  X-rays were negative for signs of spur or indications of stress fracture arthritis

## 2019-09-16 ENCOUNTER — Other Ambulatory Visit: Payer: Self-pay

## 2019-09-16 ENCOUNTER — Encounter: Payer: Self-pay | Admitting: Podiatry

## 2019-09-16 ENCOUNTER — Ambulatory Visit (INDEPENDENT_AMBULATORY_CARE_PROVIDER_SITE_OTHER): Payer: Medicare HMO | Admitting: Podiatry

## 2019-09-16 DIAGNOSIS — B351 Tinea unguium: Secondary | ICD-10-CM | POA: Diagnosis not present

## 2019-09-16 DIAGNOSIS — M722 Plantar fascial fibromatosis: Secondary | ICD-10-CM | POA: Diagnosis not present

## 2019-09-16 NOTE — Progress Notes (Signed)
Subjective:   Patient ID: Terry Edwards, female   DOB: 83 y.o.   MRN: [id number]   HPI Patient states the heel was doing real good and that it seemed like it came back some and now it seems like it is improved quite a bit   ROS      Objective:  Physical Exam  Neurovascular status intact with patient plantar heel pain left improved with 1 area where it still quite tender     Assessment:  Fasciitis-like symptoms improved but present left     Plan:  Reviewed conditions and discussed different treatment options and shoe gear modifications.  Do not recommend anything aggressive currently and I did discuss lateral pain and how to resolve the symptoms with that and patient will be seen back if symptoms were to recur

## 2019-11-11 ENCOUNTER — Telehealth: Payer: Self-pay | Admitting: Internal Medicine

## 2019-11-11 NOTE — Telephone Encounter (Signed)
Called pt for brand clarification. She received the Pfizer vaccine and I have added the two dates to her immunization record.

## 2019-11-11 NOTE — Telephone Encounter (Signed)
   Patient calling to inform Dr Sharlet Salina she received both doses of the COVID vaccine No call back needed

## 2019-12-03 DIAGNOSIS — R69 Illness, unspecified: Secondary | ICD-10-CM | POA: Diagnosis not present

## 2020-03-31 ENCOUNTER — Other Ambulatory Visit: Payer: Self-pay

## 2020-03-31 ENCOUNTER — Ambulatory Visit (INDEPENDENT_AMBULATORY_CARE_PROVIDER_SITE_OTHER): Payer: Medicare HMO | Admitting: Internal Medicine

## 2020-03-31 ENCOUNTER — Encounter: Payer: Self-pay | Admitting: Internal Medicine

## 2020-03-31 VITALS — BP 144/90 | HR 84 | Temp 98.9°F | Ht 62.0 in | Wt 134.0 lb

## 2020-03-31 DIAGNOSIS — E559 Vitamin D deficiency, unspecified: Secondary | ICD-10-CM

## 2020-03-31 DIAGNOSIS — Z0000 Encounter for general adult medical examination without abnormal findings: Secondary | ICD-10-CM

## 2020-03-31 DIAGNOSIS — M810 Age-related osteoporosis without current pathological fracture: Secondary | ICD-10-CM

## 2020-03-31 DIAGNOSIS — E785 Hyperlipidemia, unspecified: Secondary | ICD-10-CM

## 2020-03-31 DIAGNOSIS — Z8619 Personal history of other infectious and parasitic diseases: Secondary | ICD-10-CM

## 2020-03-31 LAB — COMPREHENSIVE METABOLIC PANEL
ALT: 12 U/L (ref 0–35)
AST: 18 U/L (ref 0–37)
Albumin: 4.2 g/dL (ref 3.5–5.2)
Alkaline Phosphatase: 59 U/L (ref 39–117)
BUN: 15 mg/dL (ref 6–23)
CO2: 26 mEq/L (ref 19–32)
Calcium: 9.5 mg/dL (ref 8.4–10.5)
Chloride: 108 mEq/L (ref 96–112)
Creatinine, Ser: 0.71 mg/dL (ref 0.40–1.20)
GFR: 78.86 mL/min (ref >60.00)
Glucose, Bld: 83 mg/dL (ref 70–99)
Potassium: 3.7 mEq/L (ref 3.5–5.1)
Sodium: 141 mEq/L (ref 135–145)
Total Bilirubin: 0.7 mg/dL (ref 0.2–1.2)
Total Protein: 6.4 g/dL (ref 6.0–8.3)

## 2020-03-31 LAB — CBC
HCT: 38.3 % (ref 36.0–46.0)
Hemoglobin: 12.8 g/dL (ref 12.0–15.0)
MCHC: 33.5 g/dL (ref 30.0–36.0)
MCV: 93.2 fl (ref 78.0–100.0)
Platelets: 192 10*3/uL (ref 150.0–400.0)
RBC: 4.11 Mil/uL (ref 3.87–5.11)
RDW: 13.9 % (ref 11.5–15.5)
WBC: 4.4 10*3/uL (ref 4.0–10.5)

## 2020-03-31 LAB — TSH: TSH: 2.52 u[IU]/mL (ref 0.35–4.50)

## 2020-03-31 LAB — LIPID PANEL
Cholesterol: 237 mg/dL — ABNORMAL HIGH (ref 0–200)
HDL: 82.3 mg/dL (ref >39.00)
LDL Cholesterol: 142 mg/dL — ABNORMAL HIGH (ref 0–99)
NonHDL: 154.56
Total CHOL/HDL Ratio: 3
Triglycerides: 63 mg/dL (ref 0.0–149.0)
VLDL: 12.6 mg/dL (ref 0.0–40.0)

## 2020-03-31 LAB — VITAMIN B12: Vitamin B-12: 425 pg/mL (ref 211–911)

## 2020-03-31 LAB — VITAMIN D 25 HYDROXY (VIT D DEFICIENCY, FRACTURES): VITD: 52.62 ng/mL (ref 30.00–100.00)

## 2020-03-31 MED ORDER — ACYCLOVIR 5 % EX OINT: 1.0000 "application " | TOPICAL_OINTMENT | CUTANEOUS | 11 refills | Status: DC

## 2020-03-31 NOTE — Patient Instructions (Addendum)
Plantar Fasciitis Rehab Ask your health care provider which exercises are safe for you. Do exercises exactly as told by your health care provider and adjust them as directed. It is normal to feel mild stretching, pulling, tightness, or discomfort as you do these exercises. Stop right away if you feel sudden pain or your pain gets worse. Do not begin these exercises until told by your health care provider. Stretching and range-of-motion exercises These exercises warm up your muscles and joints and improve the movement and flexibility of your foot. These exercises also help to relieve pain. Plantar fascia stretch  1. Sit with your left / right leg crossed over your opposite knee. 2. Hold your heel with one hand with that thumb near your arch. With your other hand, hold your toes and gently pull them back toward the top of your foot. You should feel a stretch on the bottom of your toes or your foot (plantar fascia) or both. 3. Hold this stretch for__________ seconds. 4. Slowly release your toes and return to the starting position. Repeat __________ times. Complete this exercise __________ times a day. Gastrocnemius stretch, standing This exercise is also called a calf (gastroc) stretch. It stretches the muscles in the back of the upper calf. 1. Stand with your hands against a wall. 2. Extend your left / right leg behind you, and bend your front knee slightly. 3. Keeping your heels on the floor and your back knee straight, shift your weight toward the wall. Do not arch your back. You should feel a gentle stretch in your upper left / right calf. 4. Hold this position for __________ seconds. Repeat __________ times. Complete this exercise __________ times a day. Soleus stretch, standing This exercise is also called a calf (soleus) stretch. It stretches the muscles in the back of the lower calf. 1. Stand with your hands against a wall. 2. Extend your left / right leg behind you, and bend your  front knee slightly. 3. Keeping your heels on the floor, bend your back knee and shift your weight slightly over your back leg. You should feel a gentle stretch deep in your lower calf. 4. Hold this position for __________ seconds. Repeat __________ times. Complete this exercise __________ times a day. Gastroc and soleus stretch, standing step This exercise stretches the muscles in the back of the lower leg. These muscles are in the upper calf (gastrocnemius) and the lower calf (soleus). 1. Stand with the ball of your left / right foot on a step. The ball of your foot is on the walking surface, right under your toes. 2. Keep your other foot firmly on the same step. 3. Hold on to the wall or a railing for balance. 4. Slowly lift your other foot, allowing your body weight to press your left / right heel down over the edge of the step. You should feel a stretch in your left / right calf. 5. Hold this position for __________ seconds. 6. Return both feet to the step. 7. Repeat this exercise with a slight bend in your left / right knee. Repeat __________ times with your left / right knee straight and __________ times with your left / right knee bent. Complete this exercise __________ times a day. Balance exercise This exercise builds your balance and strength control of your arch to help take pressure off your plantar fascia. Single leg stand If this exercise is too easy, you can try it with your eyes closed or while standing on a  pillow. 1. Without shoes, stand near a railing or in a doorway. You may hold on to the railing or door frame as needed. 2. Stand on your left / right foot. Keep your big toe down on the floor and try to keep your arch lifted. Do not let your foot roll inward. 3. Hold this position for __________ seconds. Repeat __________ times. Complete this exercise __________ times a day. This information is not intended to replace advice given to you by your health care provider. Make  sure you discuss any questions you have with your health care provider. Document Revised: 01/10/2019 Document Reviewed: 07/18/2018 Elsevier Patient Education  Terry Edwards, Terry Edwards Adopting a healthy lifestyle and getting preventive care are important in promoting health and wellness. Ask your health care provider about:  The right schedule for you to have regular tests and exams.  Things you can do on your own to prevent diseases and keep yourself healthy. What should I know about diet, weight, and exercise? Eat a healthy diet   Eat a diet that includes plenty of vegetables, fruits, low-fat dairy products, and lean protein.  Do not eat a lot of foods that are high in solid fats, added sugars, or sodium. Maintain a healthy weight Body mass index (BMI) is used to identify weight problems. It estimates body fat based on height and weight. Your health care provider can help determine your BMI and help you achieve or maintain a healthy weight. Get regular exercise Get regular exercise. This is one of the most important things you can do for your health. Most adults should:  Exercise for at least 150 minutes each week. The exercise should increase your heart rate and make you sweat (moderate-intensity exercise).  Do strengthening exercises at least twice a week. This is in addition to the moderate-intensity exercise.  Spend less time sitting. Even light physical activity can be beneficial. Watch cholesterol and blood lipids Have your blood tested for lipids and cholesterol at 82 years of age, then have this test every 5 years. Have your cholesterol levels checked more often if:  Your lipid or cholesterol levels are high.  You are older than 82 years of age.  You are at high risk for heart disease. What should I know about cancer screening? Depending on your health history and family history, you may need to have cancer screening at various ages. This may  include screening for:  Breast cancer.  Cervical cancer.  Colorectal cancer.  Skin cancer.  Lung cancer. What should I know about heart disease, diabetes, and high blood pressure? Blood pressure and heart disease  High blood pressure causes heart disease and increases the risk of stroke. This is more likely to develop in people who have high blood pressure readings, are of African descent, or are overweight.  Have your blood pressure checked: ? Every 3-5 years if you are 51-75 years of age. ? Every year if you are 32 years old or older. Diabetes Have regular diabetes screenings. This checks your fasting blood sugar level. Have the screening done:  Once every three years after age 47 if you are at a normal weight and have a low risk for diabetes.  More often and at a younger age if you are overweight or have a high risk for diabetes. What should I know about preventing infection? Hepatitis B If you have a higher risk for hepatitis B, you should be screened for this virus. Talk with your health care provider  to find out if you are at risk for hepatitis B infection. Hepatitis C Testing is recommended for:  Everyone born from [birth year] through 1963.  Anyone with known risk factors for hepatitis C. Sexually transmitted infections (STIs)  Get screened for STIs, including gonorrhea and chlamydia, if: ? You are sexually active and are younger than 82 years of age. ? You are older than 82 years of age and your health care provider tells you that you are at risk for this type of infection. ? Your sexual activity has changed since you were last screened, and you are at increased risk for chlamydia or gonorrhea. Ask your health care provider if you are at risk.  Ask your health care provider about whether you are at high risk for HIV. Your health care provider may recommend a prescription medicine to help prevent HIV infection. If you choose to take medicine to prevent HIV, you should first  get tested for HIV. You should then be tested every 3 months for as long as you are taking the medicine. Pregnancy  If you are about to stop having your period (premenopausal) and you may become pregnant, seek counseling before you get pregnant.  Take 400 to 800 micrograms (mcg) of folic acid every day if you become pregnant.  Ask for birth control (contraception) if you want to prevent pregnancy. Osteoporosis and menopause Osteoporosis is a disease in which the bones lose minerals and strength with aging. This can result in bone fractures. If you are 55 years old or older, or if you are at risk for osteoporosis and fractures, ask your health care provider if you should:  Be screened for bone loss.  Take a calcium or vitamin D supplement to lower your risk of fractures.  Be given hormone replacement therapy (HRT) to treat symptoms of menopause. Follow these instructions at home: Lifestyle  Do not use any products that contain nicotine or tobacco, such as cigarettes, e-cigarettes, and chewing tobacco. If you need help quitting, ask your health care provider.  Do not use street drugs.  Do not share needles.  Ask your health care provider for help if you need support or information about quitting drugs. Alcohol use  Do not drink alcohol if: ? Your health care provider tells you not to drink. ? You are pregnant, may be pregnant, or are planning to become pregnant.  If you drink alcohol: ? Limit how much you use to 0-1 drink a day. ? Limit intake if you are breastfeeding.  Be aware of how much alcohol is in your drink. In the [place] one drink equals one 12 oz bottle of beer (355 mL), one 5 oz glass of wine (148 mL), or one 1 oz glass of hard liquor (44 mL). General instructions  Schedule regular health, dental, and eye exams.  Stay current with your vaccines.  Tell your health care provider if: ? You often feel depressed. ? You have ever been abused or do not feel safe at  home. Summary  Adopting a healthy lifestyle and getting preventive care are important in promoting health and wellness.  Follow your health care provider's instructions about healthy diet, exercising, and getting tested or screened for diseases.  Follow your health care provider's instructions on monitoring your cholesterol and blood pressure. This information is not intended to replace advice given to you by your health care provider. Make sure you discuss any questions you have with your health care provider. Document Revised: 09/12/2018 Document Reviewed: 09/12/2018 Elsevier Patient  Education  2020 [employer]  

## 2020-03-31 NOTE — Assessment & Plan Note (Signed)
Checking Vitamin D levels

## 2020-03-31 NOTE — Assessment & Plan Note (Signed)
Checking lipid panel and currently diet controlled.

## 2020-03-31 NOTE — Assessment & Plan Note (Signed)
Did not end up taking fosamax. Due DEXA 2022 or 2023

## 2020-03-31 NOTE — Assessment & Plan Note (Signed)
Flu shot due yearly. Covid-19 up to date. Pneumonia complete. Shingrix declines but had zostavax. Tetanus due 2024. Colonoscopy aged out. Mammogram aged out, pap smear aged out and dexa due 2022. Counseled about sun safety and mole surveillance. Counseled about the dangers of distracted driving. Given 10 year screening recommendations.

## 2020-03-31 NOTE — Assessment & Plan Note (Signed)
Testing last year confirmed no active viral load. Likely old infection which cleared.

## 2020-03-31 NOTE — Progress Notes (Signed)
Subjective:   Patient ID: Terry Edwards, female    DOB: Mar 28, 1936, 84 y.o.   MRN: [id number]  HPI Here for medicare wellness and physical, no new complaints. Please see A/P for status and treatment of chronic medical problems.   Diet: heart healthy Physical activity: walks daily Depression/mood screen: negative Hearing: intact to whispered voice Visual acuity: grossly normal with lens, performs annual eye exam  ADLs: capable Fall risk: none Home safety: good Cognitive evaluation: intact to orientation, naming, recall and repetition EOL planning: adv directives discussed    Office Visit from 03/31/2020 in [place] at [facility] Total Score 0      I have personally reviewed and have noted 1. The patient's medical and social history - reviewed today no changes 2. Their use of alcohol, tobacco or illicit drugs 3. Their current medications and supplements 4. The patient's functional ability including ADL's, fall risks, home safety risks and hearing or visual impairment. 5. Diet and physical activities 6. Evidence for depression or mood disorders 7. Care team reviewed and updated  Patient Care Team: Hoyt Koch, MD as PCP - General (Internal Medicine) Past Medical History:  Diagnosis Date  . Chronic hepatitis B without hepatic coma (HCC) 1970s   chronic elev AFP  . 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 Hemorrhoids   . History of chicken pox   . History of depression 2006   situational, on Lexapro for 1 year  . Hyperlipidemia   . Osteopenia    hx fx 5th MT R foot 03/2013  . Right rotator cuff tendonitis 2013   resolved with PT (injections not helpful)  . Urine incontinence   . Vitamin D deficiency    Past Surgical History:  Procedure Laterality Date  . APPENDECTOMY  1957  . EYE SURGERY Bilateral 03/09/15, 03/23/15   cataract removal  . TONSILLECTOMY  1940's   Family History  Problem Relation Age of Onset  . Hypertension Mother    . Hyperlipidemia Mother   . Myelodysplastic syndrome Mother   . Hypertension Father   . Hyperlipidemia Father   . Diabetes Father   . Breast cancer Neg Hx    Review of Systems  Constitutional: Negative.   HENT: Negative.   Eyes: Negative.   Respiratory: Negative for cough, chest tightness and shortness of breath.   Cardiovascular: Negative for chest pain, palpitations and leg swelling.  Gastrointestinal: Negative for abdominal distention, abdominal pain, constipation, diarrhea, nausea and vomiting.  Musculoskeletal: Negative.   Skin: Negative.   Neurological: Negative.   Psychiatric/Behavioral: Negative.     Objective:  Physical Exam Constitutional:      Appearance: She is well-developed.  HENT:     Head: Normocephalic and atraumatic.  Cardiovascular:     Rate and Rhythm: Normal rate and regular rhythm.  Pulmonary:     Effort: Pulmonary effort is normal. No respiratory distress.     Breath sounds: Normal breath sounds. No wheezing or rales.  Abdominal:     General: Bowel sounds are normal. There is no distension.     Palpations: Abdomen is soft.     Tenderness: There is no abdominal tenderness. There is no rebound.  Musculoskeletal:     Cervical back: Normal range of motion.  Skin:    General: Skin is warm and dry.  Neurological:     Mental Status: She is alert and oriented to person, place, and time.     Coordination: Coordination normal.  Vitals:   03/31/20 0852  BP: (!) 144/90  Pulse: 84  Temp: 98.9 F (37.2 C)  TempSrc: Oral  SpO2: 96%  Weight: 134 lb (60.8 kg)  Height: 5\' 2"  (1.575 m)   This visit occurred during the SARS-CoV-2 public health emergency.  Safety protocols were in place, including screening questions prior to the visit, additional usage of staff PPE, and extensive cleaning of exam room while observing appropriate contact time as indicated for disinfecting solutions.   Assessment & Plan:

## 2020-05-04 DIAGNOSIS — Z6826 Body mass index (BMI) 26.0-26.9, adult: Secondary | ICD-10-CM | POA: Diagnosis not present

## 2020-05-04 DIAGNOSIS — J309 Allergic rhinitis, unspecified: Secondary | ICD-10-CM | POA: Diagnosis not present

## 2020-05-04 DIAGNOSIS — E663 Overweight: Secondary | ICD-10-CM | POA: Diagnosis not present

## 2020-05-04 DIAGNOSIS — R69 Illness, unspecified: Secondary | ICD-10-CM | POA: Diagnosis not present

## 2020-05-04 DIAGNOSIS — M1990 Unspecified osteoarthritis, unspecified site: Secondary | ICD-10-CM | POA: Diagnosis not present

## 2020-05-04 DIAGNOSIS — R030 Elevated blood-pressure reading, without diagnosis of hypertension: Secondary | ICD-10-CM | POA: Diagnosis not present

## 2020-05-04 DIAGNOSIS — Z8249 Family history of ischemic heart disease and other diseases of the circulatory system: Secondary | ICD-10-CM | POA: Diagnosis not present

## 2020-05-04 DIAGNOSIS — Z833 Family history of diabetes mellitus: Secondary | ICD-10-CM | POA: Diagnosis not present

## 2020-06-05 DIAGNOSIS — H524 Presbyopia: Secondary | ICD-10-CM | POA: Diagnosis not present

## 2020-07-19 DIAGNOSIS — R69 Illness, unspecified: Secondary | ICD-10-CM | POA: Diagnosis not present

## 2020-10-03 DIAGNOSIS — Z9289 Personal history of other medical treatment: Secondary | ICD-10-CM

## 2020-10-03 HISTORY — DX: Personal history of other medical treatment: Z92.89

## 2020-10-12 ENCOUNTER — Ambulatory Visit (INDEPENDENT_AMBULATORY_CARE_PROVIDER_SITE_OTHER): Payer: Medicare HMO | Admitting: Internal Medicine

## 2020-10-12 ENCOUNTER — Encounter: Payer: Self-pay | Admitting: Internal Medicine

## 2020-10-12 ENCOUNTER — Other Ambulatory Visit: Payer: Self-pay

## 2020-10-12 VITALS — BP 122/82 | HR 72 | Temp 97.7°F | Ht 62.0 in | Wt 135.8 lb

## 2020-10-12 DIAGNOSIS — R011 Cardiac murmur, unspecified: Secondary | ICD-10-CM | POA: Insufficient documentation

## 2020-10-12 DIAGNOSIS — M810 Age-related osteoporosis without current pathological fracture: Secondary | ICD-10-CM | POA: Diagnosis not present

## 2020-10-12 DIAGNOSIS — L659 Nonscarring hair loss, unspecified: Secondary | ICD-10-CM | POA: Insufficient documentation

## 2020-10-12 DIAGNOSIS — S46011A Strain of muscle(s) and tendon(s) of the rotator cuff of right shoulder, initial encounter: Secondary | ICD-10-CM | POA: Insufficient documentation

## 2020-10-12 DIAGNOSIS — E559 Vitamin D deficiency, unspecified: Secondary | ICD-10-CM

## 2020-10-12 DIAGNOSIS — N3941 Urge incontinence: Secondary | ICD-10-CM | POA: Diagnosis not present

## 2020-10-12 DIAGNOSIS — Z789 Other specified health status: Secondary | ICD-10-CM | POA: Diagnosis not present

## 2020-10-12 DIAGNOSIS — S46011S Strain of muscle(s) and tendon(s) of the rotator cuff of right shoulder, sequela: Secondary | ICD-10-CM

## 2020-10-12 DIAGNOSIS — B181 Chronic viral hepatitis B without delta-agent: Secondary | ICD-10-CM | POA: Insufficient documentation

## 2020-10-12 DIAGNOSIS — M722 Plantar fascial fibromatosis: Secondary | ICD-10-CM | POA: Diagnosis not present

## 2020-10-12 DIAGNOSIS — Z8619 Personal history of other infectious and parasitic diseases: Secondary | ICD-10-CM

## 2020-10-12 DIAGNOSIS — E785 Hyperlipidemia, unspecified: Secondary | ICD-10-CM

## 2020-10-12 NOTE — Progress Notes (Signed)
Provider:  Rexene Edison. Mariea Clonts, D.O., C.M.D. Location:   [place]  Place of Service:   clinic  Previous PCP: Gayland Curry, DO Patient Care Team: Gayland Curry, DO as PCP - General (Geriatric Medicine)  Extended Emergency Contact Information Primary Emergency Contact: Ashrita Chrismer,Cindy Address: [phone]          [place], [place] [address] Johnnette Litter of [place] Phone: [phone] Relation: Sister Secondary Emergency Contact: [facility] Phone: [phone] Relation: None  Code Status: DNR per new patient packet Goals of Care: Advanced Directive information Advanced Directives 10/12/2020  Does Patient Have a Medical Advance Directive? Yes  Type of Paramedic of [place];Out of facility DNR (pink MOST or yellow form)  Does patient want to make changes to medical advance directive? No - Patient declined  Copy of [place] in Chart? No - copy requested   Chief Complaint  Patient presents with  . Establish Care    New patient to establish care    HPI: Patient is a 85 y.o. female seen today to establish with [facility].  Records have been requested from PCP (in epic, Dr. Sharlet Salina).    She is not a fan of meds and was looking for an osteopathic physician.  She reports being very blessed.    Mild osteoporosis:   Dr. Sharlet Salina recommended fosamax and she declined that.  She has bene doing weightlifting and trying to get to the gym.   She does take calcium and vitamin D supplementation.  She took fosamax for many years.  Within 10 mos she developed extra bone in her mouth and was told that was unrelated but that bothered her anyway even if coincidental.  Lumbar spine was -2.5 in 2020 and 2 yrs before.    Her only fx was catching her right 5th toe on a stool.    Asks about labs--quest down the hall.  Plantar fasciitis:  Left foot.  Will feel a cold burn in the bottom of both feet.  Not that uncomfortable.  Has switched to  flat shoes, does stretching exercises.  Had one cortisone shot and trying to avoid that.    Right big toe has arthritis.    She's part of the MOVE SMART study at [facility] wear a monitor for 2 wks and during the second week and they text about mood.    She has some thinning of her hair.  She already takes biotin and has taken it.  Thyroid was ok in June.  She may consider rogaine.  She's been working on fungal toes for two years.  There has been good progress.  A few don't look like they'll come back.    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 Doesn't give blood.  Also for that reason, does not want to take lamisil.    She tore her right rotator cuff.  She manages this.  Has not had treatment.   Memory:  Off and on worries about this.  Has difficulty pulling words like names of things--occasional and more than it once was.     Bad cholesterol was high in June.  Has good good cholesterol.    Her son is a massage therapist and has been giving her some of the supplements to take.  she does not know what most of them are for--we went through them and discontinued several.  We also noted that several contained magnesium and calcium so we  decided to check labs.    She's had a lot of loss recently.   2006 she lost her husband and a couple of others.  Son died w/in the past year  She does not want to continue mammograms at her age.    Past Medical History:  Diagnosis Date  . Arthritis    Per [facility] new patient   . Chronic hepatitis B without hepatic coma (HCC) 1970s   chronic elev AFP  . 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 Hemorrhoids   . History of chicken pox   . History of depression 2006   situational, on Lexapro for 1 year  . Hyperlipidemia   . Osteopenia    hx fx 5th MT R foot 03/2013  . Plantar fasciitis of left foot    Per [facility] new patient packet  . Right rotator cuff tendonitis 2013   resolved with PT (injections not  helpful)  . Urine incontinence   . Vitamin D deficiency    Past Surgical History:  Procedure Laterality Date  . APPENDECTOMY  1957  . BLEPHAROPLASTY     Per [facility] new patient packet  . EYE SURGERY Bilateral 03/09/15, 03/23/15   cataract removal  . TONSILLECTOMY  1940's    Social History   Socioeconomic History  . Marital status: Widowed    Spouse name: Not on file  . Number of children: Not on file  . Years of education: Not on file  . Highest education level: Not on file  Occupational History  . Not on file  Tobacco Use  . Smoking status: Never Smoker  . Smokeless tobacco: Never Used  . Tobacco comment: widowed, lives alone. 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 Alcohol use: Yes    Comment: 1 per week  . Drug use: No  . Sexual activity: Not on file  Other Topics Concern  . Not on file  Social History Narrative   Diet Regular      Do you drink/eat things with caffeine  limited      Marital Status  Widowed What year were you married? 1957      Do you live in a house, apartment, assisted living, condo, trailer, etc.? House (townhouse)      Is it one or more stories? One      How many persons live in your home? 1         Do you have any pets in your home?(please list) None      Highest level of education completed: BS      Current or past profession: Scientific laboratory technician      Do you exercise?: Yes    Type and how often: 2 times a week at gym walk 2-3 times a week      Do you have a Living Will? (Form that indicates scenarios where you would not want your life prolonged) Yes      Do you have a DNR form?         If not, would you like to discuss one? Yes      Do you have signed POA/HPOA forms? Yes      Do you have difficulty bathing or dressing yourself? No      Do you have difficulty preparing food or eating? No      Do you have difficulty managing medications? No      Do  you have difficulty managing your  finances? No      Do you have difficulty affording your medications? No                  Social Determinants of Radio broadcast assistant Strain: Not on file  Food Insecurity: Not on file  Transportation Needs: Not on file  Physical Activity: Not on file  Stress: Not on file  Social Connections: Not on file    reports that she has never smoked. She has never used smokeless tobacco. She reports current alcohol use. She reports that she does not use drugs.  Functional Status Survey:    Family History  Problem Relation Age of Onset  . Hypertension Mother   . Hyperlipidemia Mother   . Myelodysplastic syndrome Mother   . Hypertension Father   . Hyperlipidemia Father   . Diabetes Father   . Suicidality Brother   . Pneumonia Son   . Parkinson's disease Son   . Breast cancer Neg Hx     Health Maintenance  Topic Date Due  . TETANUS/TDAP  04/03/2023  . INFLUENZA VACCINE  Completed  . DEXA SCAN  Completed  . COVID-19 Vaccine  Completed  . PNA vac Low Risk Adult  Completed    No Known Allergies  Outpatient Encounter Medications as of 10/12/2020  Medication Sig  . Ascorbic Acid (VITAMIN C) 1000 MG tablet Take 1,000 mg by mouth daily.  . Biotin 5000 MCG TABS Take by mouth daily.   . Calcium-Magnesium (CAL-MAG PO) Take by mouth.  . CHLOROPHYLL PO Take by mouth. 4 per day  . Cholecalciferol (VITAMIN D3) 5000 UNITS TABS Take 1 tablet by mouth daily.  . COD LIVER OIL PO Take 3,000 mg by mouth 2 (two) times daily.  Marland Kitchen MAGNESIUM LACTATE PO Take by mouth.  Marland Kitchen MANGANESE PO Take 6 mg by mouth. 4 a day  . Menaquinone-7 (VITAMIN K2 PO) Take 15 mg by mouth daily.  . Omega-3 Fatty Acids (FISH OIL) 1000 MG CPDR Take by mouth daily.  . Turmeric (QC TUMERIC COMPLEX PO) Take 1,000 mg by mouth 2 (two) times daily.  Marland Kitchen UNABLE TO FIND 72 mg. Ginkgo Torte 2 daily  . UNABLE TO FIND 2 (two) times daily. Bacopa Complex  . UNABLE TO FIND Cataplex F (B6 14m Iodine 95 mcg) 4 a day  . UNABLE TO  FIND 6 mg. Biost 4 a day  . UNABLE TO FIND 15 drops daily. Silica   No facility-administered encounter medications on file as of 10/12/2020.    Review of Systems  Constitutional: Negative for chills, fever and malaise/fatigue.  HENT: Negative for congestion, hearing loss and sore throat.   Eyes: Negative for blurred vision.       Glasses  Respiratory: Negative for cough and shortness of breath.   Cardiovascular: Negative for chest pain, palpitations and leg swelling.  Gastrointestinal: Negative for abdominal pain, blood in stool, constipation and melena.  Genitourinary: Negative for dysuria.       Some urge incontinence  Musculoskeletal: Negative for joint pain.  Skin: Negative for itching and rash.  Neurological: Negative for dizziness, loss of consciousness and weakness.  Psychiatric/Behavioral: Positive for memory loss. Negative for depression. The patient is not nervous/anxious.        Reports some word-finding challenges    Vitals:   10/12/20 1346  BP: 122/82  Pulse: 72  Temp: 97.7 F (36.5 C)  TempSrc: Temporal  SpO2: 98%  Weight: 135 lb 12.8  oz (61.6 kg)  Height: '5\' 2"'  (1.575 m)   Body mass index is 24.84 kg/m. Physical Exam Vitals reviewed.  Constitutional:      General: She is not in acute distress.    Appearance: Normal appearance. She is not toxic-appearing.  HENT:     Head: Normocephalic and atraumatic.     Right Ear: Tympanic membrane, ear canal and external ear normal.     Left Ear: Tympanic membrane, ear canal and external ear normal.     Nose: Nose normal.     Mouth/Throat:     Pharynx: Oropharynx is clear.  Eyes:     Extraocular Movements: Extraocular movements intact.     Conjunctiva/sclera: Conjunctivae normal.     Pupils: Pupils are equal, round, and reactive to light.     Comments: glasses  Cardiovascular:     Rate and Rhythm: Normal rate and regular rhythm.     Pulses: Normal pulses.     Heart sounds: Murmur heard.      Comments: 2/6  murmur second ICS on right with some radiation throughout precordium Pulmonary:     Effort: Pulmonary effort is normal.     Breath sounds: Normal breath sounds.  Abdominal:     General: Bowel sounds are normal. There is no distension.     Palpations: Abdomen is soft.     Tenderness: There is no abdominal tenderness. There is no guarding or rebound.  Musculoskeletal:        General: Normal range of motion.     Cervical back: Neck supple.     Right lower leg: No edema.     Left lower leg: No edema.  Lymphadenopathy:     Cervical: No cervical adenopathy.  Skin:    General: Skin is warm and dry.  Neurological:     General: No focal deficit present.     Mental Status: She is alert and oriented to person, place, and time.     Cranial Nerves: No cranial nerve deficit.     Motor: No weakness.     Gait: Gait normal.  Psychiatric:        Mood and Affect: Mood normal.        Behavior: Behavior normal.        Thought Content: Thought content normal.        Judgment: Judgment normal.     Labs reviewed: Basic Metabolic Panel: Recent Labs    03/31/20 0924  NA 141  K 3.7  CL 108  CO2 26  GLUCOSE 83  BUN 15  CREATININE 0.71  CALCIUM 9.5   Liver Function Tests: Recent Labs    03/31/20 0924  AST 18  ALT 12  ALKPHOS 59  BILITOT 0.7  PROT 6.4  ALBUMIN 4.2   No results for input(s): LIPASE, AMYLASE in the last 8760 hours. No results for input(s): AMMONIA in the last 8760 hours. CBC: Recent Labs    03/31/20 0924  WBC 4.4  HGB 12.8  HCT 38.3  MCV 93.2  PLT 192.0   Cardiac Enzymes: No results for input(s): CKTOTAL, CKMB, CKMBINDEX, TROPONINI in the last 8760 hours. BNP: Invalid input(s): POCBNP No results found for: HGBA1C Lab Results  Component Value Date   TSH 2.52 03/31/2020   Lab Results  Component Value Date   VITAMINB12 425 03/31/2020   No results found for: FOLATE No results found for: IRON, TIBC, FERRITIN  Imaging and Procedures noted on new patient  packet:  Assessment/Plan 1. Takes dietary supplements -  many many of them -she does not want to be on so many things if they are not helping her and I got concerned about electrolyte levels when I read the bottles -stopped several - Magnesium - BMP with eGFR([facility]) - CBC with Differential/Platelet; Future - COMPLETE METABOLIC PANEL WITH GFR; Future - Lipid panel; Future - TSH; Future  2. Vitamin D deficiency -cont vitamin D3 and calcium supplement  3. Age-related osteoporosis without current pathological fracture -lumbar T score -2.5 - cont ca with D and additional D3, does not want to take fosamax -recheck bone density in 2023 - COMPLETE METABOLIC PANEL WITH GFR; Future  4. Hx of type B viral hepatitis - reports that eventually labs showed she cleared the infection - COMPLETE METABOLIC PANEL WITH GFR; Future  5. Hyperlipidemia, unspecified hyperlipidemia type - continue fish oil and cod liver oil, does not want to take medications for this; last LDL was significantly elevated -eats a balanced diet and does exercise  - Lipid panel; Future  6. Murmur, cardiac - newly noted today and sounds like aortic stenosis--she's asymptomatic - CBC with Differential/Platelet; Future - COMPLETE METABOLIC PANEL WITH GFR; Future  7. Thinning hair -continues despite silica supplement and biotin -d/c silica, may consider stopping biotin too b/c not helping - CBC with Differential/Platelet; Future - COMPLETE METABOLIC PANEL WITH GFR; Future - TSH; Future  8. Plantar fasciitis -cont regular exercises and avoiding high heels  9. Traumatic incomplete tear of right rotator cuff, sequela -cont current conservative mgt and monitor  10. Urge incontinence -recommended kegels b/c she notes that she might have a little stress incontinence with cough, too  Mg, bmp today, rest before next visit  Labs/tests ordered:   Lab Orders     Magnesium     BMP with eGFR([facility])     CBC with  Differential/Platelet     COMPLETE METABOLIC PANEL WITH GFR     Lipid panel     TSH  6 mos for CPE, fasting labs before  Teeghan Hammer L. Ankith [place]ston, D.O. [facility] [address], [place] [address] Cell Phone (Mon-Fri 8am-5pm):  [phone] On Call:  [phone] & follow prompts after 5pm & weekends Office Phone:  [phone] Office Fax:  [phone]

## 2020-10-13 LAB — MAGNESIUM: Magnesium: 2.1 mg/dL (ref 1.5–2.5)

## 2020-10-13 LAB — BASIC METABOLIC PANEL WITH GFR
BUN: 19 mg/dL (ref 7–25)
CO2: 29 mmol/L (ref 20–32)
Calcium: 9.7 mg/dL (ref 8.6–10.4)
Chloride: 107 mmol/L (ref 98–110)
Creat: 0.69 mg/dL (ref 0.60–0.88)
GFR, Est African American: 94 mL/min/{1.73_m2} (ref >=60)
GFR, Est Non African American: 81 mL/min/{1.73_m2} (ref >=60)
Glucose, Bld: 104 mg/dL — ABNORMAL HIGH (ref 65–99)
Potassium: 4.1 mmol/L (ref 3.5–5.3)
Sodium: 143 mmol/L (ref 135–146)

## 2020-10-13 NOTE — Progress Notes (Signed)
Magnesium and calcium levels were ok.  I still don't think she needs the supplements we decided to stop during there visit.

## 2020-11-23 ENCOUNTER — Encounter: Payer: Self-pay | Admitting: Internal Medicine

## 2021-01-26 ENCOUNTER — Other Ambulatory Visit: Payer: Self-pay

## 2021-01-26 ENCOUNTER — Encounter: Payer: Self-pay | Admitting: Internal Medicine

## 2021-01-26 ENCOUNTER — Ambulatory Visit (INDEPENDENT_AMBULATORY_CARE_PROVIDER_SITE_OTHER): Payer: Medicare HMO | Admitting: Internal Medicine

## 2021-01-26 DIAGNOSIS — R21 Rash and other nonspecific skin eruption: Secondary | ICD-10-CM

## 2021-01-26 DIAGNOSIS — M25841 Other specified joint disorders, right hand: Secondary | ICD-10-CM

## 2021-01-26 MED ORDER — SILVER SULFADIAZINE 1 % EX CREA: 1.0000 "application " | TOPICAL_CREAM | Freq: Every day | CUTANEOUS | 0 refills | Status: DC

## 2021-01-26 MED ORDER — MELOXICAM 15 MG PO TABS: 15.0000 mg | ORAL_TABLET | Freq: Every day | ORAL | 0 refills | Status: DC

## 2021-01-26 NOTE — Assessment & Plan Note (Signed)
Rx silvadene ointment for the leg. If no improvement may need oral treatment.

## 2021-01-26 NOTE — Progress Notes (Signed)
   Subjective:   Patient ID: Terry Edwards, female    DOB: July 25, 1936, 85 y.o.   MRN: [id number]  HPI The patient is an 86 YO female coming in for concerns about redness and wound on the left leg (started a few months ago with a dry skin spot, she did pick this and it started bleeding, it has gotten red around the area within the last week or so, denies fevers or chills, some pain locally with touching the area, no tenderness without touching it, overall stable since redness started, using neosporin without relief), and bump on hand (noticed a few weeks ago, tender to touch, makes driving hard, denies numbness or weakness in the fingers, denies fingers locking up, has not tried anything for it).   Review of Systems  Constitutional: Negative.   HENT: Negative.   Eyes: Negative.   Respiratory: Negative for cough, chest tightness and shortness of breath.   Cardiovascular: Negative for chest pain, palpitations and leg swelling.  Gastrointestinal: Negative for abdominal distention, abdominal pain, constipation, diarrhea, nausea and vomiting.  Musculoskeletal: Positive for myalgias.  Skin: Positive for color change and wound.  Neurological: Negative.   Psychiatric/Behavioral: Negative.     Objective:  Physical Exam Constitutional:      Appearance: She is well-developed.  HENT:     Head: Normocephalic and atraumatic.  Cardiovascular:     Rate and Rhythm: Normal rate and regular rhythm.  Pulmonary:     Effort: Pulmonary effort is normal. No respiratory distress.     Breath sounds: Normal breath sounds. No wheezing or rales.  Abdominal:     General: Bowel sounds are normal. There is no distension.     Palpations: Abdomen is soft.     Tenderness: There is no abdominal tenderness. There is no rebound.  Musculoskeletal:     Cervical back: Normal range of motion.     Comments: Cyst right palm tender to touch, no catching of the tendon and no numbness or weakness in the hand  Skin:     General: Skin is warm and dry.     Findings: Rash present.     Comments: Area mostly healed left shin with some surrounding erythema about 1 cm border of wound  Neurological:     Mental Status: She is alert and oriented to person, place, and time.     Coordination: Coordination normal.     Vitals:   01/26/21 0918  BP: 118/66  Pulse: 81  Resp: 18  Temp: 98.4 F (36.9 C)  TempSrc: Oral  SpO2: 97%  Weight: 133 lb 12.8 oz (60.7 kg)  Height: 5\' 2"  (1.575 m)    This visit occurred during the SARS-CoV-2 public health emergency.  Safety protocols were in place, including screening questions prior to the visit, additional usage of staff PPE, and extensive cleaning of exam room while observing appropriate contact time as indicated for disinfecting solutions.   Assessment & Plan:

## 2021-01-26 NOTE — Assessment & Plan Note (Signed)
Rx meloxicam to try for 2 weeks. If no improvement can refer to hand surgery to remove.

## 2021-01-26 NOTE — Patient Instructions (Signed)
We have sent in silvadene ointment to use on the leg daily for 2 weeks.  We have sent in meloxicam to take which is anti-inflammatory for 2 weeks to help the cyst on the hand. If this does not help let us know.

## 2021-02-01 ENCOUNTER — Encounter: Payer: Self-pay | Admitting: Internal Medicine

## 2021-02-22 DIAGNOSIS — L570 Actinic keratosis: Secondary | ICD-10-CM | POA: Diagnosis not present

## 2021-02-22 DIAGNOSIS — C44729 Squamous cell carcinoma of skin of left lower limb, including hip: Secondary | ICD-10-CM | POA: Diagnosis not present

## 2021-02-22 DIAGNOSIS — Z85828 Personal history of other malignant neoplasm of skin: Secondary | ICD-10-CM | POA: Diagnosis not present

## 2021-04-01 ENCOUNTER — Encounter: Payer: Medicare HMO | Admitting: Internal Medicine

## 2021-04-12 ENCOUNTER — Ambulatory Visit (INDEPENDENT_AMBULATORY_CARE_PROVIDER_SITE_OTHER): Payer: Medicare HMO | Admitting: Internal Medicine

## 2021-04-12 ENCOUNTER — Other Ambulatory Visit: Payer: Self-pay

## 2021-04-12 ENCOUNTER — Other Ambulatory Visit: Payer: Medicare HMO

## 2021-04-12 ENCOUNTER — Encounter: Payer: Self-pay | Admitting: Internal Medicine

## 2021-04-12 VITALS — BP 122/80 | HR 77 | Temp 98.2°F | Resp 18 | Ht 62.0 in | Wt 133.2 lb

## 2021-04-12 DIAGNOSIS — M810 Age-related osteoporosis without current pathological fracture: Secondary | ICD-10-CM

## 2021-04-12 DIAGNOSIS — E782 Mixed hyperlipidemia: Secondary | ICD-10-CM | POA: Diagnosis not present

## 2021-04-12 DIAGNOSIS — Z8619 Personal history of other infectious and parasitic diseases: Secondary | ICD-10-CM | POA: Diagnosis not present

## 2021-04-12 DIAGNOSIS — Z0000 Encounter for general adult medical examination without abnormal findings: Secondary | ICD-10-CM | POA: Diagnosis not present

## 2021-04-12 DIAGNOSIS — Z1231 Encounter for screening mammogram for malignant neoplasm of breast: Secondary | ICD-10-CM

## 2021-04-12 DIAGNOSIS — R011 Cardiac murmur, unspecified: Secondary | ICD-10-CM | POA: Diagnosis not present

## 2021-04-12 LAB — LIPID PANEL
Cholesterol: 258 mg/dL — ABNORMAL HIGH (ref 0–200)
HDL: 79.1 mg/dL (ref >39.00)
LDL Cholesterol: 166 mg/dL — ABNORMAL HIGH (ref 0–99)
NonHDL: 178.63
Total CHOL/HDL Ratio: 3
Triglycerides: 61 mg/dL (ref 0.0–149.0)
VLDL: 12.2 mg/dL (ref 0.0–40.0)

## 2021-04-12 LAB — COMPREHENSIVE METABOLIC PANEL
ALT: 15 U/L (ref 0–35)
AST: 18 U/L (ref 0–37)
Albumin: 4.3 g/dL (ref 3.5–5.2)
Alkaline Phosphatase: 61 U/L (ref 39–117)
BUN: 16 mg/dL (ref 6–23)
CO2: 29 mEq/L (ref 19–32)
Calcium: 9.4 mg/dL (ref 8.4–10.5)
Chloride: 108 mEq/L (ref 96–112)
Creatinine, Ser: 0.72 mg/dL (ref 0.40–1.20)
GFR: 77.67 mL/min (ref >60.00)
Glucose, Bld: 86 mg/dL (ref 70–99)
Potassium: 3.9 mEq/L (ref 3.5–5.1)
Sodium: 143 mEq/L (ref 135–145)
Total Bilirubin: 0.6 mg/dL (ref 0.2–1.2)
Total Protein: 6.5 g/dL (ref 6.0–8.3)

## 2021-04-12 LAB — CBC
HCT: 39.1 % (ref 36.0–46.0)
Hemoglobin: 13.4 g/dL (ref 12.0–15.0)
MCHC: 34.3 g/dL (ref 30.0–36.0)
MCV: 90.3 fl (ref 78.0–100.0)
Platelets: 214 10*3/uL (ref 150.0–400.0)
RBC: 4.32 Mil/uL (ref 3.87–5.11)
RDW: 13.6 % (ref 11.5–15.5)
WBC: 4.7 10*3/uL (ref 4.0–10.5)

## 2021-04-12 MED ORDER — DONEPEZIL HCL 5 MG PO TABS: 5.0000 mg | ORAL_TABLET | Freq: Every day | ORAL | 3 refills | Status: DC

## 2021-04-12 NOTE — Progress Notes (Signed)
Subjective:   Patient ID: Terry Edwards, female    DOB: 08-15-1936, 85 y.o.   MRN: [id number]  HPI Here for medicare wellness and physical, no new complaints. Please see A/P for status and treatment of chronic medical problems.   Diet: heart healthy Physical activity: walking and exercise Depression/mood screen: negative Hearing: intact to whispered voice Visual acuity: grossly normal with lens, performs annual eye exam  ADLs: capable Fall risk: none Home safety: good Cognitive evaluation: intact to orientation, naming, recall and repetition EOL planning: adv directives discussed  [facility] Visit from 10/12/2020 in [facility]  PHQ-2 Total Score 0       I have personally reviewed and have noted 1. The patient's medical and social history - reviewed today no changes 2. Their use of alcohol, tobacco or illicit drugs 3. Their current medications and supplements 4. The patient's functional ability including ADL's, fall risks, home safety risks and hearing or visual impairment. 5. Diet and physical activities 6. Evidence for depression or mood disorders 7. Care team reviewed and updated 8.  The patient is not on an opioid pain medication.  Patient Care Team: Hoyt Koch, MD as PCP - General (Internal Medicine) Past Medical History:  Diagnosis Date   Arthritis    Per [facility] new patient    Chronic hepatitis B without hepatic coma ([place]) 1970s   chronic elev AFP   Cyst of pineal gland    incidental on MRI   Hemorrhoids    History of chicken pox    History of depression 2006   situational, on Lexapro for 1 year   Hyperlipidemia    Osteopenia    hx fx 5th MT R foot 03/2013   Plantar fasciitis of left foot    Per [facility] new patient packet   Right rotator cuff tendonitis 2013   resolved with PT (injections not helpful)   Urine incontinence    Vitamin D deficiency    Past Surgical History:  Procedure Laterality Date   APPENDECTOMY  1957    BLEPHAROPLASTY     Per [facility] new patient packet   EYE SURGERY Bilateral 03/09/15, 03/23/15   cataract removal   TONSILLECTOMY  1940's   Family History  Problem Relation Age of Onset   Hypertension Mother    Hyperlipidemia Mother    Myelodysplastic syndrome Mother    Hypertension Father    Hyperlipidemia Father    Diabetes Father    Suicidality Brother    Pneumonia Son    Parkinson's disease Son    Breast cancer Neg Hx    Review of Systems  Constitutional: Negative.   HENT: Negative.    Eyes: Negative.   Respiratory:  Negative for cough, chest tightness and shortness of breath.   Cardiovascular:  Negative for chest pain, palpitations and leg swelling.  Gastrointestinal:  Negative for abdominal distention, abdominal pain, constipation, diarrhea, nausea and vomiting.  Musculoskeletal:  Positive for arthralgias.  Skin: Negative.   Neurological: Negative.   Psychiatric/Behavioral: Negative.     Objective:  Physical Exam Constitutional:      Appearance: She is well-developed.  HENT:     Head: Normocephalic and atraumatic.  Cardiovascular:     Rate and Rhythm: Normal rate and regular rhythm.     Heart sounds: Murmur heard.  Pulmonary:     Effort: Pulmonary effort is normal. No respiratory distress.     Breath sounds: Normal breath sounds. No wheezing or rales.  Abdominal:  General: Bowel sounds are normal. There is no distension.     Palpations: Abdomen is soft.     Tenderness: There is no abdominal tenderness. There is no rebound.  Musculoskeletal:     Cervical back: Normal range of motion.  Skin:    General: Skin is warm and dry.  Neurological:     Mental Status: She is alert and oriented to person, place, and time.     Coordination: Coordination normal.    Vitals:   04/12/21 0759  BP: 122/80  Pulse: 77  Resp: 18  Temp: 98.2 F (36.8 C)  TempSrc: Oral  SpO2: 95%  Weight: 133 lb 3.2 oz (60.4 kg)  Height: 5\' 2"  (1.575 m)   EKG: Rate 64, axis normal, interval  normal, sinus, no st or t wave changes, no significant change compared to prior 2015   This visit occurred during the SARS-CoV-2 public health emergency.  Safety protocols were in place, including screening questions prior to the visit, additional usage of staff PPE, and extensive cleaning of exam room while observing appropriate contact time as indicated for disinfecting solutions.   Assessment & Plan:

## 2021-04-12 NOTE — Patient Instructions (Addendum)
Creams for neuropathy including capsaicin have the best evidence behind them.  We have sent in aricept to try taking to maintain the memory 1 pill daily.  The EKG of the heart is the same as from 2015 and we will get the ultrasound of the heart to look at the valves and the chambers of the heart.   The bone density test we will get done also.

## 2021-04-12 NOTE — Assessment & Plan Note (Signed)
EKG done which is not changed from prior. Ordered ECHO to assess.

## 2021-04-12 NOTE — Assessment & Plan Note (Signed)
Checking CMP for any change.

## 2021-04-12 NOTE — Assessment & Plan Note (Signed)
Ordered DEXA for follow up as due. Not on medication currently. Exercising and taking calcium and vitamin D.

## 2021-04-12 NOTE — Assessment & Plan Note (Signed)
Flu shot yearly. Covid-19 3 shots. Pneumonia complete. Shingrix counseled to get at pharmacy. Tetanus due 2024. Colonoscopy aged out. Mammogram ordered today, pap smear aged out and dexa ordered today. Counseled about sun safety and mole surveillance. Counseled about the dangers of distracted driving. Given 10 year screening recommendations.

## 2021-04-12 NOTE — Assessment & Plan Note (Signed)
Checking lipid panel and adjust as needed. Is exercising.

## 2021-04-13 ENCOUNTER — Ambulatory Visit (INDEPENDENT_AMBULATORY_CARE_PROVIDER_SITE_OTHER)
Admission: RE | Admit: 2021-04-13 | Discharge: 2021-04-13 | Disposition: A | Discharge status: Home / Self Care | Payer: Medicare HMO | Source: Ambulatory Visit | Attending: Internal Medicine | Admitting: Internal Medicine

## 2021-04-13 DIAGNOSIS — M810 Age-related osteoporosis without current pathological fracture: Secondary | ICD-10-CM | POA: Diagnosis not present

## 2021-04-14 ENCOUNTER — Encounter: Payer: Self-pay | Admitting: Internal Medicine

## 2021-04-15 ENCOUNTER — Encounter: Payer: Self-pay | Admitting: Internal Medicine

## 2021-04-15 ENCOUNTER — Ambulatory Visit: Payer: Medicare HMO | Admitting: Internal Medicine

## 2021-04-16 ENCOUNTER — Other Ambulatory Visit: Payer: Self-pay | Admitting: Internal Medicine

## 2021-04-16 MED ORDER — ALENDRONATE SODIUM 70 MG PO TABS: 70.0000 mg | ORAL_TABLET | ORAL | 3 refills | Status: DC

## 2021-04-21 ENCOUNTER — Ambulatory Visit (HOSPITAL_COMMUNITY): Payer: Medicare HMO | Attending: Cardiology

## 2021-04-21 ENCOUNTER — Other Ambulatory Visit: Payer: Self-pay

## 2021-04-21 DIAGNOSIS — R011 Cardiac murmur, unspecified: Secondary | ICD-10-CM | POA: Diagnosis not present

## 2021-04-21 LAB — ECHOCARDIOGRAM COMPLETE
Area-P 1/2: 3.19 cm2
P 1/2 time: 460 msec
S' Lateral: 2.1 cm

## 2021-04-22 ENCOUNTER — Telehealth: Payer: Self-pay

## 2021-04-22 NOTE — Telephone Encounter (Signed)
Team health FYI :  ---Caller states she started on Aricept and she has a lot of red itchy spots scattered on her body. Started medication on Sunday and itching started on Monday.

## 2021-04-22 NOTE — Telephone Encounter (Signed)
Pt called stating she may be experiencing an allergic reaction to her medication Aricept '5mg'$ . Pt states she is itching all over and has hives like raised bumps similar to mosquito bites.  **Pt was transferred over to team health for further eval.

## 2021-04-23 ENCOUNTER — Encounter: Payer: Self-pay | Admitting: Internal Medicine

## 2021-04-23 ENCOUNTER — Ambulatory Visit (INDEPENDENT_AMBULATORY_CARE_PROVIDER_SITE_OTHER): Payer: Medicare HMO | Admitting: Internal Medicine

## 2021-04-23 ENCOUNTER — Ambulatory Visit: Payer: Medicare HMO | Admitting: Family Medicine

## 2021-04-23 ENCOUNTER — Other Ambulatory Visit: Payer: Self-pay

## 2021-04-23 VITALS — BP 136/79 | HR 86 | Temp 99.1°F | Ht 62.0 in | Wt 133.0 lb

## 2021-04-23 DIAGNOSIS — L270 Generalized skin eruption due to drugs and medicaments taken internally: Secondary | ICD-10-CM

## 2021-04-23 MED ORDER — HYDROXYZINE HCL 10 MG PO TABS: 10.0000 mg | ORAL_TABLET | Freq: Three times a day (TID) | ORAL | 0 refills | Status: DC | PRN

## 2021-04-23 MED ORDER — TRIAMCINOLONE ACETONIDE 0.5 % EX CREA: 1.0000 "application " | TOPICAL_CREAM | Freq: Three times a day (TID) | CUTANEOUS | 1 refills | Status: DC

## 2021-04-23 MED ORDER — METHYLPREDNISOLONE ACETATE 40 MG/ML IJ SUSP
80.0000 mg | Freq: Once | INTRAMUSCULAR | Status: AC
  Administered 2021-04-23: 80 mg via INTRAMUSCULAR

## 2021-04-23 NOTE — Patient Instructions (Signed)
Drug Allergy A drug allergy happens when the body's disease-fighting system (immune system) reacts badly to a medicine. Drug allergies range from mild to severe. Some allergic reactions occur one week or more after you are exposed to a medicine (delayed reaction). A sudden (acute), severe allergic reaction that affects multiple areas of the body is called an anaphylactic reaction (anaphylaxis). Anaphylaxis can be life-threatening. All allergic reactions to a medicinerequire medical evaluation, even if the allergic reaction appears to be mild. What are the causes? This condition is caused by the immune system wrongly identifying a medicine as being harmful. When this happens, the body releases proteins (antibodies) and other compounds, such as histamine, into the bloodstream. This causes swelling in certain tissues and reduces blood flow to important areas, such asthe heart and lungs. Almost any medicine can cause an allergic reaction. Medicines that commonly cause allergic reactions (are common allergens) include: Penicillin. Sulfa medicines (sulfonamides). Medicines that numb certain areas of the body (local anesthetics). X-ray dyes that contain iodine. What are the signs or symptoms? Common symptoms of a mild allergic reaction include: Nasal congestion. Tingling in the mouth. An itchy, red rash. Common symptoms of a severe allergic reaction include: Swelling of the eyes, lips, face, or tongue. Swelling of the back of the mouth and the throat. Wheezing. A hoarse voice. Itchy, red, swollen areas of skin (hives). Dizziness or light-headedness. Fainting. Anxiety or confusion. Abdominal pain. Difficulty breathing, speaking, or swallowing. Chest tightness. Fast or irregular heartbeats (palpitations). Vomiting. Diarrhea. How is this diagnosed? This condition is diagnosed based on a physical exam and your history of recent exposure to one or more medicines. You may be referred for follow-up  testing by a health care provider who specializes in allergies. This testing can confirm the diagnosis of a drug allergy and determine which medicines you are allergic to. Testing may include: Skin tests. These may involve: Injecting a small amount of the possible allergen between layers of your skin (intradermal injection). Applying patches to your skin. Blood tests. Drug challenge. For this test, a health care provider gives you a small amount of a medicine in gradual doses while watching for an allergic reaction. If you are unsure of what caused your allergic reaction, your health care provider may ask you for: Information about all medicines that you take on a regular basis. The date and time of your reaction. How is this treated? There is no cure for allergies. However, an allergic reaction can be treated with: Medicines that help: Reduce pain and swelling (NSAIDs). Relieve itching and hives (antihistamines). Reduce swelling (corticosteroids). Respiratory inhalers. These are inhaled medicines that help open (dilate) the airways in your lungs. Injections of medicine that helps to relax the muscles in your airways and tighten your blood vessels (epinephrine). Severe allergic reactions, such as anaphylaxis, require immediate treatment in a hospital. You may need to be hospitalized for observation. You may also be prescribed rescue medicines, such as epinephrine. Epinephrine comes in many forms, including what is commonly called an auto-injector "pen" (pre-filled automatic epinephrine injection device). Follow these instructions at home: If you have a severe allergy  Always keep an auto-injector pen or your anaphylaxis kit near you. These can be lifesaving if you have a severe reaction. Use your auto-injector pen or anaphylaxis kit as told by your health care provider. Make sure that you, the members of your household, and your employer know: How to use an anaphylaxis kit. How to use an  auto-injector pen to give you an  epinephrine injection. Replace your epinephrine immediately after you use your auto-injector pen, in case you have another reaction. Wear a medical alert bracelet or necklace that states your drug allergy, if told by your health care provider.  General instructions Avoid medicines that you are allergic to. Take over-the-counter and prescription medicines only as told by your health care provider. If you were given medicines to treat your reaction, do not drive until your health care provider approves. If you have hives or a rash: Use an over-the-counter antihistamine as told by your health care provider. Apply cold, wet cloths (cold compresses) to your skin or take baths or showers in cool water. Avoid hot water. If you had tests done, it is up to you to get your test results. Ask your health care provider when your results will be ready. Tell any health care providers who care for you that you have a drug allergy. Keep all follow-up visits as told by your health care provider. This is important. Contact a health care provider if: You think that you are having a mild allergic reaction. Symptoms of an allergic reaction usually start within 30 minutes after you are exposed to a medicine. You have symptoms that last more than 2 days after your reaction. Your symptoms get worse. You develop new symptoms. Get help right away if: You needed to use epinephrine. An epinephrine injection helps to manage life-threatening allergic reactions, but you still need to go to the emergency room even if epinephrine seems to work. This is important because anaphylaxis may happen again within 72 hours (rebound anaphylaxis). If you used epinephrine to treat anaphylaxis outside of the hospital, you need additional medical care. This may include more doses of epinephrine. You develop symptoms of a severe allergic reaction. These symptoms may represent a serious problem that is an  emergency. Do not wait to see if the symptoms will go away. Use your auto-injector pen or anaphylaxis kit as you have been instructed, and get medical help right away. Call your local emergency services (911 in the [place] Do not drive yourself to the hospital. Summary A drug allergy happens when the body's disease-fighting system reacts badly to a medicine. Drug allergies range from mild to severe. In some cases, an allergic reaction may be life-threatening. If you have a severe allergy, always keep an auto-injector pen or your anaphylaxis kit near you. This information is not intended to replace advice given to you by your health care provider. Make sure you discuss any questions you have with your healthcare provider. Document Revised: 04/04/2018 Document Reviewed: 04/04/2018 Elsevier Patient Education  [place].

## 2021-04-23 NOTE — Progress Notes (Signed)
Subjective:  Patient ID: Terry Edwards, female    DOB: [birth year]  Age: 85 y.o. MRN: [id number]  CC: Rash  This visit occurred during the SARS-CoV-2 public health emergency.  Safety protocols were in place, including screening questions prior to the visit, additional usage of staff PPE, and extensive cleaning of exam room while observing appropriate contact time as indicated for disinfecting solutions.    HPI Terry Edwards presents for f/up -  She took her first dose of Aricept 5 days ago.  The day after taking her first dose she developed an itchy, red rash on her torso and extremities.  She has not gotten much symptom relief with Claritin.  She took her last dose of Aricept 2 days ago.  Outpatient Medications Prior to Visit  Medication Sig Dispense Refill   alendronate (FOSAMAX) 70 MG tablet Take 1 tablet (70 mg total) by mouth every 7 (seven) days. Take with a full glass of water on an empty stomach. 12 tablet 3   Ascorbic Acid (VITAMIN C) 1000 MG tablet Take 1,000 mg by mouth daily.     Biotin 5000 MCG TABS Take by mouth daily.      Calcium-Magnesium (CAL-MAG PO) Take by mouth.     Cholecalciferol (VITAMIN D3) 5000 UNITS TABS Take 1 tablet by mouth daily.     COD LIVER OIL PO Take 3,000 mg by mouth 2 (two) times daily.     Omega-3 Fatty Acids (FISH OIL) 1000 MG CPDR Take by mouth daily.     silver sulfADIAZINE (SILVADENE) 1 % cream Apply 1 application topically daily. 50 g 0   Turmeric (QC TUMERIC COMPLEX PO) Take 1,000 mg by mouth 2 (two) times daily.     UNABLE TO FIND 6 mg. Biost 4 a day     donepezil (ARICEPT) 5 MG tablet Take 1 tablet (5 mg total) by mouth at bedtime. (Patient not taking: Reported on 04/23/2021) 90 tablet 3   No facility-administered medications prior to visit.    ROS Review of Systems  Constitutional:  Negative for diaphoresis, fatigue and fever.  HENT:  Negative for facial swelling, sore throat and trouble swallowing.   Respiratory: Negative.   Negative for cough, chest tightness, shortness of breath and wheezing.   Cardiovascular:  Negative for chest pain, palpitations and leg swelling.  Gastrointestinal:  Negative for abdominal pain.  Genitourinary: Negative.  Negative for difficulty urinating.  Musculoskeletal: Negative.   Skin:  Positive for rash.  Neurological: Negative.  Negative for dizziness, weakness and light-headedness.  Hematological:  Negative for adenopathy. Does not bruise/bleed easily.  Psychiatric/Behavioral: Negative.     Objective:  BP 136/79 (BP Location: Right Arm, Patient Position: Sitting, Cuff Size: Large)   Pulse 86   Temp 99.1 F (37.3 C) (Oral)   Ht '5\' 2"'$  (1.575 m)   Wt 133 lb (60.3 kg)   SpO2 97%   BMI 24.33 kg/m   BP Readings from Last 3 Encounters:  04/23/21 136/79  04/12/21 122/80  01/26/21 118/66    Wt Readings from Last 3 Encounters:  04/23/21 133 lb (60.3 kg)  04/12/21 133 lb 3.2 oz (60.4 kg)  01/26/21 133 lb 12.8 oz (60.7 kg)    Physical Exam Vitals reviewed.  Constitutional:      Appearance: Normal appearance. She is not ill-appearing.  HENT:     Nose: Nose normal.     Mouth/Throat:     Mouth: Mucous membranes are moist.  Eyes:     Conjunctiva/sclera: Conjunctivae  normal.  Cardiovascular:     Rate and Rhythm: Normal rate and regular rhythm.     Heart sounds: No murmur heard. Pulmonary:     Effort: Pulmonary effort is normal. No respiratory distress.     Breath sounds: No stridor. No wheezing, rhonchi or rales.  Abdominal:     General: Abdomen is flat.     Palpations: There is no mass.     Tenderness: There is no abdominal tenderness. There is no guarding.  Musculoskeletal:     Cervical back: Neck supple.  Lymphadenopathy:     Cervical: No cervical adenopathy.  Skin:    General: Skin is warm.     Coloration: Skin is not pale.     Findings: Erythema and rash present. No bruising or lesion.     Comments: Over the torso and extremities there are too numerous to  count erythematous blanching macules and papules.  There is no involvement of the face, mucous membranes, palms, or soles.  There are no targets, vesicles, induration, or streaking.  Neurological:     General: No focal deficit present.     Mental Status: She is alert.  Psychiatric:        Mood and Affect: Mood normal.    Lab Results  Component Value Date   WBC 4.7 04/12/2021   HGB 13.4 04/12/2021   HCT 39.1 04/12/2021   PLT 214.0 04/12/2021   GLUCOSE 86 04/12/2021   CHOL 258 (H) 04/12/2021   TRIG 61.0 04/12/2021   HDL 79.10 04/12/2021   LDLCALC 166 (H) 04/12/2021   ALT 15 04/12/2021   AST 18 04/12/2021   NA 143 04/12/2021   K 3.9 04/12/2021   CL 108 04/12/2021   CREATININE 0.72 04/12/2021   BUN 16 04/12/2021   CO2 29 04/12/2021   TSH 2.52 03/31/2020    DG Bone Density  Result Date: 04/14/2021 Formatting of this result is different from the original. Date of study: 04/13/21 Exam: DUAL X-RAY ABSORPTIOMETRY (DXA) FOR BONE MINERAL DENSITY (BMD) Instrument: [employer] [facility] Provider: PCP Indication: screening for osteoporosis Comparison: none (please note that it is not possible to compare data from different instruments) Clinical data: Pt is a 85 y.o. female without history of fracture. Results:  Lumbar spine L1-L4 Femoral neck (FN) 33% distal radius T-score -2.6 RFN: -1.6 LFN: -2.0 n/a Change in BMD from previous DXA test (%) Down 0.7% Down 2.0% n/a (*) statistically significant Assessment: Patient has OSTEOPOROSIS according to the [facility] classification for osteoporosis (see below). Fracture risk: high Comments: the technical quality of the study is good Evaluation for secondary causes should be considered if clinically indicated. Recommend optimizing calcium (1200 mg/day) and vitamin D (800 IU/day). Treatment is indicated. Followup: Repeat BMD is appropriate after 2 years or after 1-2 years if starting treatment. WHO criteria for diagnosis of osteoporosis in postmenopausal  women and in men 79 y/o or older: - normal: T-score -1.0 to + 1.0 - osteopenia/low bone density: T-score between -2.5 and -1.0 - osteoporosis: T-score below -2.5 - severe osteoporosis: T-score below -2.5 with history of fragility fracture Note: although not part of the WHO classification, the presence of a fragility fracture, regardless of the T-score, should be considered diagnostic of osteoporosis, provided other causes for the fracture have been excluded. Treatment: The National Osteoporosis Foundation recommends that treatment be considered in postmenopausal women and men age 38 or older with: 1. Hip or vertebral (clinical or morphometric) fracture 2. T-score of - 2.5 or lower at the  spine or hip 3. 10-year fracture probability by FRAX of at least 20% for a major osteoporotic fracture and 3% for a hip fracture Loura Pardon MD    Assessment & Plan:   Lanyiah was seen today for rash.  Diagnoses and all orders for this visit:  Drug-induced skin rash- She will not restart Aricept.  Will treat with an injection of methylprednisolone as well as a topical steroid and a more potent antihistamine. -     triamcinolone cream (KENALOG) 0.5 %; Apply 1 application topically 3 (three) times daily. -     hydrOXYzine (ATARAX/VISTARIL) 10 MG tablet; Take 1 tablet (10 mg total) by mouth every 8 (eight) hours as needed. -     methylPREDNISolone acetate (DEPO-MEDROL) injection 80 mg  I have discontinued Guelda J. Serpe's donepezil. I am also having her start on triamcinolone cream and hydrOXYzine. Additionally, I am having her maintain her Fish Oil, Vitamin D3, vitamin C, Biotin, Turmeric (QC TUMERIC COMPLEX PO), COD LIVER OIL PO, Calcium-Magnesium (CAL-MAG PO), UNABLE TO FIND, silver sulfADIAZINE, and alendronate. We administered methylPREDNISolone acetate.  Meds ordered this encounter  Medications   triamcinolone cream (KENALOG) 0.5 %    Sig: Apply 1 application topically 3 (three) times daily.    Dispense:   60 g    Refill:  1   hydrOXYzine (ATARAX/VISTARIL) 10 MG tablet    Sig: Take 1 tablet (10 mg total) by mouth every 8 (eight) hours as needed.    Dispense:  30 tablet    Refill:  0   methylPREDNISolone acetate (DEPO-MEDROL) injection 80 mg     Follow-up: Return if symptoms worsen or fail to improve.  Scarlette Calico, MD

## 2021-04-26 ENCOUNTER — Telehealth: Payer: Self-pay

## 2021-04-26 NOTE — Telephone Encounter (Signed)
Per patient the pharmacy is unable to fill the order for the triamcinolone cream (KENALOG) 0.5 %. Asking for clarification.   Please advise and contact the pharmacy

## 2021-04-27 NOTE — Telephone Encounter (Signed)
Patient states she still does not have Kenalog cream . Pharmacy needing clarification   Please call [facility]

## 2021-04-27 NOTE — Telephone Encounter (Signed)
I have spoken to [place], pharmacy tech, [facility] needed clarification on how many days the quantity would be used for. After speaking with Dr. Ronnald Ramp he stated that is typically a 30 day supply. Joellen Jersey has been informed and will process the Rx.

## 2021-04-28 ENCOUNTER — Encounter: Payer: Self-pay | Admitting: Internal Medicine

## 2021-04-28 ENCOUNTER — Other Ambulatory Visit: Payer: Self-pay | Admitting: Internal Medicine

## 2021-04-28 DIAGNOSIS — L270 Generalized skin eruption due to drugs and medicaments taken internally: Secondary | ICD-10-CM

## 2021-05-06 ENCOUNTER — Encounter: Payer: Self-pay | Admitting: Internal Medicine

## 2021-05-14 DIAGNOSIS — L814 Other melanin hyperpigmentation: Secondary | ICD-10-CM | POA: Diagnosis not present

## 2021-05-14 DIAGNOSIS — L821 Other seborrheic keratosis: Secondary | ICD-10-CM | POA: Diagnosis not present

## 2021-05-14 DIAGNOSIS — L98499 Non-pressure chronic ulcer of skin of other sites with unspecified severity: Secondary | ICD-10-CM | POA: Diagnosis not present

## 2021-05-14 DIAGNOSIS — D485 Neoplasm of uncertain behavior of skin: Secondary | ICD-10-CM | POA: Diagnosis not present

## 2021-05-14 DIAGNOSIS — Z85828 Personal history of other malignant neoplasm of skin: Secondary | ICD-10-CM | POA: Diagnosis not present

## 2021-05-14 DIAGNOSIS — D1801 Hemangioma of skin and subcutaneous tissue: Secondary | ICD-10-CM | POA: Diagnosis not present

## 2021-05-21 ENCOUNTER — Encounter: Payer: Self-pay | Admitting: Internal Medicine

## 2021-05-31 DIAGNOSIS — Z1211 Encounter for screening for malignant neoplasm of colon: Secondary | ICD-10-CM | POA: Diagnosis not present

## 2021-06-11 DIAGNOSIS — H5203 Hypermetropia, bilateral: Secondary | ICD-10-CM | POA: Diagnosis not present

## 2021-06-18 ENCOUNTER — Encounter: Payer: Self-pay | Admitting: Internal Medicine

## 2021-06-19 ENCOUNTER — Encounter: Payer: Self-pay | Admitting: Internal Medicine

## 2021-06-28 ENCOUNTER — Encounter: Payer: Self-pay | Admitting: Internal Medicine

## 2021-06-28 NOTE — Telephone Encounter (Signed)
Have any medical notes been sent to Dr. Sharlet Salina from a Dr.Jon Nicki Reaper pt requesting status

## 2021-06-30 NOTE — Telephone Encounter (Signed)
I have not seen any.

## 2021-07-06 ENCOUNTER — Other Ambulatory Visit: Payer: Self-pay

## 2021-07-07 ENCOUNTER — Encounter: Payer: Self-pay | Admitting: Internal Medicine

## 2021-07-07 ENCOUNTER — Ambulatory Visit (INDEPENDENT_AMBULATORY_CARE_PROVIDER_SITE_OTHER): Payer: Medicare HMO | Admitting: Internal Medicine

## 2021-07-07 VITALS — BP 128/84 | HR 84 | Temp 97.8°F | Resp 18 | Ht 62.0 in | Wt 136.6 lb

## 2021-07-07 DIAGNOSIS — Z23 Encounter for immunization: Secondary | ICD-10-CM

## 2021-07-07 DIAGNOSIS — R195 Other fecal abnormalities: Secondary | ICD-10-CM

## 2021-07-07 DIAGNOSIS — G453 Amaurosis fugax: Secondary | ICD-10-CM | POA: Diagnosis not present

## 2021-07-07 NOTE — Progress Notes (Signed)
   Subjective:   Patient ID: Terry Edwards [place], female    DOB: 17-Aug-1936, 85 y.o.   MRN: [id number]  HPI The patient is an 85 YO female coming in for several concerns.   Review of Systems  Constitutional: Negative.   HENT: Negative.    Eyes:  Positive for visual disturbance.  Respiratory:  Negative for cough, chest tightness and shortness of breath.   Cardiovascular:  Negative for chest pain, palpitations and leg swelling.  Gastrointestinal:  Negative for abdominal distention, abdominal pain, constipation, diarrhea, nausea and vomiting.  Musculoskeletal:  Positive for arthralgias.  Skin: Negative.   Neurological: Negative.   Psychiatric/Behavioral: Negative.     Objective:  Physical Exam Constitutional:      Appearance: She is well-developed.  HENT:     Head: Normocephalic and atraumatic.  Cardiovascular:     Rate and Rhythm: Normal rate and regular rhythm.  Pulmonary:     Effort: Pulmonary effort is normal. No respiratory distress.     Breath sounds: Normal breath sounds. No wheezing or rales.  Abdominal:     General: Bowel sounds are normal. There is no distension.     Palpations: Abdomen is soft.     Tenderness: There is no abdominal tenderness. There is no rebound.  Musculoskeletal:     Cervical back: Normal range of motion.  Skin:    General: Skin is warm and dry.  Neurological:     Mental Status: She is alert and oriented to person, place, and time.     Coordination: Coordination normal.    Vitals:   07/07/21 1039  BP: 128/84  Pulse: 84  Resp: 18  Temp: 97.8 F (36.6 C)  TempSrc: Oral  SpO2: 97%  Weight: 136 lb 9.6 oz (62 kg)  Height: 5\' 2"  (1.575 m)    This visit occurred during the SARS-CoV-2 public health emergency.  Safety protocols were in place, including screening questions prior to the visit, additional usage of staff PPE, and extensive cleaning of exam room while observing appropriate contact time as indicated for disinfecting solutions.    Assessment & Plan:  Flu shot given at visit

## 2021-07-07 NOTE — Patient Instructions (Signed)
We will get the ultrasound of the neck arteries and get you in for the virtual colonoscopy.

## 2021-07-09 ENCOUNTER — Encounter: Payer: Self-pay | Admitting: Internal Medicine

## 2021-07-09 ENCOUNTER — Other Ambulatory Visit: Payer: Self-pay

## 2021-07-09 ENCOUNTER — Ambulatory Visit (HOSPITAL_COMMUNITY)
Admission: RE | Admit: 2021-07-09 | Discharge: 2021-07-09 | Disposition: A | Discharge status: Home / Self Care | Payer: Medicare HMO | Source: Ambulatory Visit | Attending: Cardiovascular Disease | Admitting: Cardiovascular Disease

## 2021-07-09 DIAGNOSIS — G453 Amaurosis fugax: Secondary | ICD-10-CM | POA: Diagnosis not present

## 2021-07-09 DIAGNOSIS — R195 Other fecal abnormalities: Secondary | ICD-10-CM | POA: Insufficient documentation

## 2021-07-09 NOTE — Assessment & Plan Note (Signed)
Notes received from her eye specialist and ordered carotid US to assess for blockage. Treat as appropriate and she is under assessment for this from eye specialist.

## 2021-07-09 NOTE — Assessment & Plan Note (Signed)
Cologuard appears to have been inadvertently been ordered by our office and was positive. She has had extreme difficulty with GI in the past due to tortuous colon and has done only barium enema in past. Referral to GI for consideration of virtual colonoscopy and did discuss this with patient and possible need for multiple preps if abnormalities are detected.

## 2021-07-19 ENCOUNTER — Encounter: Payer: Self-pay | Admitting: Internal Medicine

## 2021-07-19 DIAGNOSIS — Z1211 Encounter for screening for malignant neoplasm of colon: Secondary | ICD-10-CM

## 2021-07-22 ENCOUNTER — Other Ambulatory Visit: Payer: Self-pay

## 2021-07-22 ENCOUNTER — Ambulatory Visit
Admission: RE | Admit: 2021-07-22 | Discharge: 2021-07-22 | Disposition: A | Discharge status: Home / Self Care | Payer: Medicare HMO | Source: Ambulatory Visit | Attending: Internal Medicine | Admitting: Internal Medicine

## 2021-07-22 DIAGNOSIS — Z1231 Encounter for screening mammogram for malignant neoplasm of breast: Secondary | ICD-10-CM | POA: Diagnosis not present

## 2021-08-04 ENCOUNTER — Encounter: Payer: Self-pay | Admitting: Internal Medicine

## 2021-08-06 NOTE — Telephone Encounter (Signed)
Patient is calling back in about colonoscopy referral  Says it needs to be sent to:  [facility] [address] [place], [place] [address] [phone] (f)

## 2021-08-06 NOTE — Telephone Encounter (Signed)
See below

## 2021-08-09 ENCOUNTER — Encounter: Payer: Self-pay | Admitting: Internal Medicine

## 2021-08-10 NOTE — Telephone Encounter (Signed)
Patient contacted [facility] and they had not received when they checked again. Requesting the orders to be re-faxed. If the fax does not go through again the patient is asking if we can contact [facility] to see if they have an alternative method. Verified the fax # that was provided by [facility].   Requesting a callback to update once it's completed.

## 2021-08-11 NOTE — Telephone Encounter (Addendum)
   Spoke with Terry Edwards at [facility] . As stated in referral note the patient needs to call [phone] to schedule virtual colonoscopy. They DO indeed do the procedure. They have several locations for patient to select from.  The authorization / location is [facility] because that is what the centralized scheduled advised must be put on order request.  Patient needs to call [phone] to be scheduled for CT Virtual colonoscopy as previously given to patient.  Spoke with patient today, please re-fax order to [facility]

## 2021-08-11 NOTE — Telephone Encounter (Signed)
Order has been re-faxed and emailed to the information provided below. Confirmation fax was received.

## 2021-08-11 NOTE — Telephone Encounter (Signed)
[facility] checking status of order for virtual colonoscopy  Representative states they have not received orders  Please re-fax  Fax [phone] Email [email or web address]

## 2021-08-12 ENCOUNTER — Encounter: Payer: Self-pay | Admitting: Gastroenterology

## 2021-08-12 ENCOUNTER — Ambulatory Visit: Payer: Medicare HMO | Admitting: Gastroenterology

## 2021-08-12 VITALS — BP 140/80 | HR 69 | Ht 62.0 in | Wt 132.8 lb

## 2021-08-12 DIAGNOSIS — R195 Other fecal abnormalities: Secondary | ICD-10-CM

## 2021-08-12 NOTE — Progress Notes (Signed)
08/12/2021 Terry Edwards [place] [id number] May 31, 1936   HISTORY OF PRESENT ILLNESS:  This is an 85 year old female who is new to our office.  She has been referred here by her PCP, Dr. Sharlet Salina, for evaluation regarding a positive Cologuard study.  Patient tells me that she had an unsedated flexible sigmoidoscopy 30 years ago in [place] and they were reportedly "unable to get through" so since then she has undergone barium enemas and Cologuard tests.  She has had 2 negative Cologuard tests and then the most recent has been positive.  She did look into getting a virtual colonoscopy and was able to get that approved by her insurance.  She came here today to discuss the options of re-attempt at colonoscopy vs virtual CT scan.  She says that she moves her bowels well without any issues.  She denies any rectal bleeding.  She denies any abdominal pain.  No family history of colon cancer.  Past Medical History:  Diagnosis Date   Arthritis    Per [place] new patient    Chronic hepatitis B without hepatic coma ([place]) 1970s   chronic elev AFP   Cyst of pineal gland    incidental on MRI   Hemorrhoids    History of chicken pox    History of depression 2006   situational, on Lexapro for 1 year   Hyperlipidemia    Osteopenia    hx fx 5th MT R foot 03/2013   Plantar fasciitis of left foot    Per [facility] new patient packet   Right rotator cuff tendonitis 2013   resolved with PT (injections not helpful)   Urine incontinence    Vitamin D deficiency    Past Surgical History:  Procedure Laterality Date   APPENDECTOMY  1957   BLEPHAROPLASTY     Per [place] new patient packet   EYE SURGERY Bilateral 03/09/15, 03/23/15   cataract removal   TONSILLECTOMY  1940's    reports that she has never smoked. She has never used smokeless tobacco. She reports current alcohol use. She reports that she does not use drugs. family history includes Diabetes in her father; Hyperlipidemia in her father and mother; Hypertension in her  father and mother; Myelodysplastic syndrome in her mother; Parkinson's disease in her son; Pneumonia in her son; Suicidality in her brother. Allergies  Allergen Reactions   Aricept [Donepezil] Rash   Fosamax [Alendronate] Rash      Outpatient Encounter Medications as of 08/12/2021  Medication Sig   Ascorbic Acid (VITAMIN C) 1000 MG tablet Take 1,000 mg by mouth daily.   Biotin 5000 MCG TABS Take by mouth daily.    Calcium-Magnesium (CAL-MAG PO) Take by mouth.   Cholecalciferol (VITAMIN D3) 5000 UNITS TABS Take 1 tablet by mouth daily.   COD LIVER OIL PO Take 3,000 mg by mouth 2 (two) times daily.   hydrOXYzine (ATARAX/VISTARIL) 10 MG tablet TAKE ONE TABLET BY MOUTH EVERY EIGHT HOURS AS NEEDED   miconazole (ANTIFUNGAL) 2 % cream Apply 1 application topically 2 (two) times daily.   Omega-3 Fatty Acids (FISH OIL) 1000 MG CPDR Take by mouth daily.   silver sulfADIAZINE (SILVADENE) 1 % cream Apply 1 application topically daily.   triamcinolone cream (KENALOG) 0.5 % Apply 1 application topically 3 (three) times daily.   Turmeric (QC TUMERIC COMPLEX PO) Take 1,000 mg by mouth 2 (two) times daily.   [DISCONTINUED] alendronate (FOSAMAX) 70 MG tablet Take 1 tablet (70 mg total) by mouth every 7 (  seven) days. Take with a full glass of water on an empty stomach. (Patient not taking: Reported on 08/12/2021)   [DISCONTINUED] UNABLE TO FIND 6 mg. Biost 4 a day (Patient not taking: Reported on 08/12/2021)   No facility-administered encounter medications on file as of 08/12/2021.     REVIEW OF SYSTEMS  : All other systems reviewed and negative except where noted in the History of Present Illness.   PHYSICAL EXAM: BP 140/80   Pulse 69   Ht 5\' 2"  (1.575 m)   Wt 132 lb 12.8 oz (60.2 kg)   SpO2 97%   BMI 24.29 kg/m  General: Well developed white female in no acute distress Head: Normocephalic and atraumatic Eyes:  Sclerae anicteric, conjunctiva pink. Ears: Normal auditory acuity Lungs: Clear  throughout to auscultation; no W/R/R. Heart: Regular rate and rhythm; no M/R/G. Abdomen: Soft, non-distended.  BS present. Non-tender. Rectal:  Will be done at the time of colonoscopy. Musculoskeletal: Symmetrical with no gross deformities  Skin: No lesions on visible extremities Extremities: No edema  Neurological: Alert oriented x 4, grossly non-focal Psychological:  Alert and cooperative. Normal mood and affect  ASSESSMENT AND PLAN: *Positive Cologuard: Patient had an unsedated flexible sigmoidoscopy 30 years ago and they were reportedly "unable to get through" so since then she has undergone barium enemas and Cologuard tests.  She has had 2 negative Cologuard tests and then the most recent has been positive.  She did look into getting a virtual colonoscopy and was able to get that approved by her insurance.  She is willing to proceed with colonoscopy attempt first, however.  We discussed that it may have been the fact that her body was not sedated so therefore was not relaxed.  This will be scheduled with Dr. Rush Landmark.  If he is not able to complete it without difficulty then at that point we could proceed with the virtual CT scan.  CC:  Hoyt Koch, *

## 2021-08-12 NOTE — Patient Instructions (Signed)
If you are age 83 or older, your body mass index should be between 23-30. Your Body mass index is 24.29 kg/m. If this is out of the aforementioned range listed, please consider follow up with your Primary Care Provider.  If you are age 39 or younger, your body mass index should be between 19-25. Your Body mass index is 24.29 kg/m. If this is out of the aformentioned range listed, please consider follow up with your Primary Care Provider.   You have been scheduled for a colonoscopy. Please follow written instructions given to you at your visit today.  Please pick up your prep supplies at the pharmacy within the next 1-3 days. If you use inhalers (even only as needed), please bring them with you on the day of your procedure.  The [facility] providers would like to encourage you to use [facility] to communicate with providers for non-urgent requests or questions.  Due to long hold times on the telephone, sending your provider a message by [facility] may be a faster and more efficient way to get a response.  Please allow 48 business hours for a response.  Please remember that this is for non-urgent requests.   It was a pleasure to see you today!  Thank you for trusting me with your gastrointestinal care!    Alonza Bogus, PA-C

## 2021-08-13 NOTE — Progress Notes (Signed)
Attending Physician's Attestation   I have reviewed the chart.   I agree with the Advanced Practitioner's note, impression, and recommendations with any updates as below. Agree with need for attempt at colonoscopy to go where no person has gone before.  If unable to complete then patient may require virtual colonoscopy.   Justice Britain, MD [facility] Advanced Endoscopy Office # [phone]

## 2021-08-17 ENCOUNTER — Ambulatory Visit (AMBULATORY_SURGERY_CENTER): Payer: Medicare HMO | Admitting: Gastroenterology

## 2021-08-17 ENCOUNTER — Other Ambulatory Visit: Payer: Self-pay

## 2021-08-17 ENCOUNTER — Encounter: Payer: Self-pay | Admitting: Gastroenterology

## 2021-08-17 VITALS — BP 169/87 | HR 79 | Temp 98.6°F | Resp 18 | Ht 62.0 in | Wt 132.0 lb

## 2021-08-17 DIAGNOSIS — R195 Other fecal abnormalities: Secondary | ICD-10-CM | POA: Diagnosis not present

## 2021-08-17 DIAGNOSIS — D127 Benign neoplasm of rectosigmoid junction: Secondary | ICD-10-CM | POA: Diagnosis not present

## 2021-08-17 DIAGNOSIS — D123 Benign neoplasm of transverse colon: Secondary | ICD-10-CM | POA: Diagnosis not present

## 2021-08-17 DIAGNOSIS — K641 Second degree hemorrhoids: Secondary | ICD-10-CM | POA: Diagnosis not present

## 2021-08-17 DIAGNOSIS — K5730 Diverticulosis of large intestine without perforation or abscess without bleeding: Secondary | ICD-10-CM | POA: Diagnosis not present

## 2021-08-17 DIAGNOSIS — K648 Other hemorrhoids: Secondary | ICD-10-CM | POA: Diagnosis not present

## 2021-08-17 DIAGNOSIS — D120 Benign neoplasm of cecum: Secondary | ICD-10-CM | POA: Diagnosis not present

## 2021-08-17 DIAGNOSIS — K635 Polyp of colon: Secondary | ICD-10-CM | POA: Diagnosis not present

## 2021-08-17 MED ORDER — SODIUM CHLORIDE 0.9 % IV SOLN: 500.0000 mL | Freq: Once | INTRAVENOUS | Status: DC

## 2021-08-17 NOTE — Progress Notes (Signed)
GASTROENTEROLOGY PROCEDURE H&P NOTE   Primary Care Physician: Hoyt Koch, MD  HPI: Terry Edwards is a 85 y.o. female who presents for Colonoscopy for positive Cologuard and prior failed colonoscopy years ago.  Past Medical History:  Diagnosis Date   Arthritis    Per [facility] new patient    Chronic hepatitis B without hepatic coma ([place]) 1970s   chronic elev AFP   Cyst of pineal gland    incidental on MRI   Hemorrhoids    History of chicken pox    History of depression 2006   situational, on Lexapro for 1 year   Hyperlipidemia    Osteopenia    hx fx 5th MT R foot 03/2013   Plantar fasciitis of left foot    Per [facility] new patient packet   Right rotator cuff tendonitis 2013   resolved with PT (injections not helpful)   Urine incontinence    Vitamin D deficiency    Past Surgical History:  Procedure Laterality Date   APPENDECTOMY  1957   BLEPHAROPLASTY     Per [place] new patient packet   EYE SURGERY Bilateral 03/09/15, 03/23/15   cataract removal   TONSILLECTOMY  1940's   Current Outpatient Medications  Medication Sig Dispense Refill   Ascorbic Acid (VITAMIN C) 1000 MG tablet Take 1,000 mg by mouth daily.     Biotin 5000 MCG TABS Take by mouth daily.      Calcium-Magnesium (CAL-MAG PO) Take by mouth.     Cholecalciferol (VITAMIN D3) 5000 UNITS TABS Take 1 tablet by mouth daily.     COD LIVER OIL PO Take 3,000 mg by mouth 2 (two) times daily.     hydrOXYzine (ATARAX/VISTARIL) 10 MG tablet TAKE ONE TABLET BY MOUTH EVERY EIGHT HOURS AS NEEDED 30 tablet 0   miconazole (ANTIFUNGAL) 2 % cream Apply 1 application topically 2 (two) times daily.     Omega-3 Fatty Acids (FISH OIL) 1000 MG CPDR Take by mouth daily.     silver sulfADIAZINE (SILVADENE) 1 % cream Apply 1 application topically daily. 50 g 0   triamcinolone cream (KENALOG) 0.5 % Apply 1 application topically 3 (three) times daily. 60 g 1   Turmeric (QC TUMERIC COMPLEX PO) Take 1,000 mg by mouth 2 (two) times daily.      Current Facility-Administered Medications  Medication Dose Route Frequency Provider Last Rate Last Admin   0.9 %  sodium chloride infusion  500 mL Intravenous Once Mansouraty, Telford Nab., MD        Current Outpatient Medications:    Ascorbic Acid (VITAMIN C) 1000 MG tablet, Take 1,000 mg by mouth daily., Disp: , Rfl:    Biotin 5000 MCG TABS, Take by mouth daily. , Disp: , Rfl:    Calcium-Magnesium (CAL-MAG PO), Take by mouth., Disp: , Rfl:    Cholecalciferol (VITAMIN D3) 5000 UNITS TABS, Take 1 tablet by mouth daily., Disp: , Rfl:    COD LIVER OIL PO, Take 3,000 mg by mouth 2 (two) times daily., Disp: , Rfl:    hydrOXYzine (ATARAX/VISTARIL) 10 MG tablet, TAKE ONE TABLET BY MOUTH EVERY EIGHT HOURS AS NEEDED, Disp: 30 tablet, Rfl: 0   miconazole (ANTIFUNGAL) 2 % cream, Apply 1 application topically 2 (two) times daily., Disp: , Rfl:    Omega-3 Fatty Acids (FISH OIL) 1000 MG CPDR, Take by mouth daily., Disp: , Rfl:    silver sulfADIAZINE (SILVADENE) 1 % cream, Apply 1 application topically daily., Disp: 50 g, Rfl: 0  triamcinolone cream (KENALOG) 0.5 %, Apply 1 application topically 3 (three) times daily., Disp: 60 g, Rfl: 1   Turmeric (QC TUMERIC COMPLEX PO), Take 1,000 mg by mouth 2 (two) times daily., Disp: , Rfl:   Current Facility-Administered Medications:    0.9 %  sodium chloride infusion, 500 mL, Intravenous, Once, Mansouraty, Telford Nab., MD Allergies  Allergen Reactions   Aricept [Donepezil] Rash   Fosamax [Alendronate] Rash   Family History  Problem Relation Age of Onset   Hypertension Mother    Hyperlipidemia Mother    Myelodysplastic syndrome Mother    Hypertension Father    Hyperlipidemia Father    Diabetes Father    Suicidality Brother    Pneumonia Son    Parkinson's disease Son    Breast cancer Neg Hx    Social History   Socioeconomic History   Marital status: Widowed    Spouse name: Not on file   Number of children: Not on file   Years of education: Not  on file   Highest education level: Not on file  Occupational History   Not on file  Tobacco Use   Smoking status: Never   Smokeless tobacco: Never   Tobacco comments:    widowed, lives alone. 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 1957      Do you live in a house, apartment, assisted living, condo, trailer, etc.? House (townhouse)      Is it one or more stories? One      How many persons live in your home? 1         Do you have any pets in your home?(please list) None      Highest level of education completed: BS      Current or past profession: Scientific laboratory technician      Do you exercise?: Yes    Type and how often: 2 times a week at gym walk 2-3 times a week      Do you have a Living Will? (Form that indicates scenarios where you would not want your life prolonged) Yes      Do you have a DNR form?         If not, would you like to discuss one? Yes      Do you have signed POA/HPOA forms? Yes      Do you have difficulty bathing or dressing yourself? No      Do you have difficulty preparing food or eating? No      Do you have difficulty managing medications? No      Do you have difficulty managing your finances? No      Do you have difficulty affording your medications? No                  Social Determinants of Radio broadcast assistant Strain: Not on file  Food Insecurity: Not on file  Transportation Needs: Not on file  Physical Activity: Not on file  Stress: Not on file  Social Connections: Not on file  Intimate Partner Violence: Not on file    Physical Exam: Today's Vitals   08/17/21 1342  BP: (!) 165/97  Pulse: 98  Temp:  98.6 F (37 C)  TempSrc: Temporal  SpO2: 97%  Weight: 132 lb (59.9 kg)  Height: 5\' 2"  (1.575 m)   Body mass index is 24.14 kg/m. GEN: NAD EYE: Sclerae anicteric ENT: MMM CV: Non-tachycardic GI: Soft, NT/ND NEURO:  Alert & Oriented x 3  Lab Results: No results for input(s): WBC, HGB, HCT, PLT in the last 72 hours. BMET No results for input(s): NA, K, CL, CO2, GLUCOSE, BUN, CREATININE, CALCIUM in the last 72 hours. LFT No results for input(s): PROT, ALBUMIN, AST, ALT, ALKPHOS, BILITOT, BILIDIR, IBILI in the last 72 hours. PT/INR No results for input(s): LABPROT, INR in the last 72 hours.   Impression / Plan: This is a 85 y.o.female who presents for Colonoscopy for positive Cologuard and prior failed colonoscopy years ago.  The risks and benefits of endoscopic evaluation/treatment were discussed with the patient and/or family; these include but are not limited to the risk of perforation, infection, bleeding, missed lesions, lack of diagnosis, severe illness requiring hospitalization, as well as anesthesia and sedation related illnesses.  The patient's history has been reviewed, patient examined, no change in status, and deemed stable for procedure.  The patient and/or family is agreeable to proceed.    Justice Britain, MD [facility] Advanced Endoscopy Office # [phone]

## 2021-08-17 NOTE — Progress Notes (Signed)
Pt Drowsy. VSS. To PACU, report to RN. No anesthetic complications noted.  

## 2021-08-17 NOTE — Progress Notes (Signed)
VS-CW 

## 2021-08-17 NOTE — Op Note (Signed)
[facility] Patient Name: Terry Edwards [place] Procedure Date: 08/17/2021 2:34 PM MRN: [id number] Endoscopist: Justice Britain , MD Age: 85 Referring MD:  Date of Birth: 1936-03-21 Gender: Female Account #: [id number] Procedure:                Colonoscopy Indications:              Positive Cologuard test Medicines:                Monitored Anesthesia Care Procedure:                Pre-Anesthesia Assessment:                           - Prior to the procedure, a History and Physical                            was performed, and patient medications and                            allergies were reviewed. The patient's tolerance of                            previous anesthesia was also reviewed. The risks                            and benefits of the procedure and the sedation                            options and risks were discussed with the patient.                            All questions were answered, and informed consent                            was obtained. Prior Anticoagulants: The patient has                            taken no previous anticoagulant or antiplatelet                            agents. ASA Grade Assessment: III - A patient with                            severe systemic disease. After reviewing the risks                            and benefits, the patient was deemed in                            satisfactory condition to undergo the procedure.                           After obtaining informed consent, the colonoscope  was passed under direct vision. Throughout the                            procedure, the patient's blood pressure, pulse, and                            oxygen saturations were monitored continuously. The                            Olympus PCF-H190DL ([id number]) Colonoscope was                            introduced through the anus and advanced to the 5                            cm into the ileum. The  colonoscopy was extremely                            difficult due to restricted mobility of the colon,                            a redundant colon, significant looping and a                            tortuous colon. Successful completion of the                            procedure was aided by changing the patient to a                            supine position, using manual pressure, withdrawing                            and reinserting the scope, straightening and                            shortening the scope to obtain bowel loop reduction                            and using scope torsion. The patient tolerated the                            procedure. The quality of the bowel preparation was                            good. The terminal ileum, ileocecal valve,                            appendiceal orifice, and rectum were photographed. Scope In: 2:43:40 PM Scope Out: 3:18:25 PM Scope Withdrawal Time: 0 hours 15 minutes 55 seconds  Total Procedure Duration: 0 hours 34 minutes 45 seconds  Findings:                 The digital  rectal exam findings include                            hemorrhoids. Pertinent negatives include no                            palpable rectal lesions.                           The left colon was grossly tortuous.                           The terminal ileum and ileocecal valve appeared                            normal.                           Three sessile polyps were found in the                            recto-sigmoid colon (1), transverse colon (1), and                            cecum (1). The polyps were 3 to 7 mm in size. These                            polyps were removed with a cold snare. Resection                            and retrieval were complete.                           Multiple small-mouthed diverticula were found in                            the recto-sigmoid colon and sigmoid colon.                           Normal mucosa was  found in the entire colon                            otherwise.                           Non-bleeding non-thrombosed external and internal                            hemorrhoids were found during retroflexion, during                            perianal exam and during digital exam. The                            hemorrhoids were Grade II (internal hemorrhoids  that prolapse but reduce spontaneously). Complications:            No immediate complications. Estimated Blood Loss:     Estimated blood loss was minimal. Impression:               - Hemorrhoids found on digital rectal exam.                           - Extremely tortuous/redundant/loopy colon.                           - The examined portion of the ileum was normal.                           - Three 3 to 7 mm polyps at the recto-sigmoid                            colon, in the transverse colon and in the cecum,                            removed with a cold snare. Resected and retrieved.                           - Diverticulosis in the recto-sigmoid colon and in                            the sigmoid colon.                           - Normal mucosa in the entire examined colon                            otherwise.                           - Non-bleeding non-thrombosed external and internal                            hemorrhoids. Recommendation:           - The patient will be observed post-procedure,                            until all discharge criteria are met.                           - Discharge patient to home.                           - Patient has a contact number available for                            emergencies. The signs and symptoms of potential                            delayed complications were discussed with the  patient. Return to normal activities tomorrow.                            Written discharge instructions were provided to the                             patient.                           - High fiber diet.                           - Use FiberCon 1-2 tablets PO daily.                           - Continue present medications.                           - Await pathology results.                           - Repeat colonoscopy in 3/5/7 years for                            surveillance based on pathology results and                            patient's health at the time as she is already over                            the age of 31 thus this will likely be her last                            colonoscopy.                           - If patient develops anemia or Iron deficiency in                            future, then she should undergo an EGD for                            evaluation further.                           - The findings and recommendations were discussed                            with the patient.                           - The findings and recommendations were discussed                            with the patient's family. Justice Britain, MD 08/17/2021 3:27:43 PM

## 2021-08-17 NOTE — Patient Instructions (Addendum)
Read handouts provided to you on high fiber diet, polyps, hemorrhoids, and diverticulosis  YOU HAD AN ENDOSCOPIC PROCEDURE TODAY AT [facility]:   Refer to the procedure report that was given to you for any specific questions about what was found during the examination.  If the procedure report does not answer your questions, please call your gastroenterologist to clarify.  If you requested that your care partner not be given the details of your procedure findings, then the procedure report has been included in a sealed envelope for you to review at your convenience later.  YOU SHOULD EXPECT: Some feelings of bloating in the abdomen. Passage of more gas than usual.  Walking can help get rid of the air that was put into your GI tract during the procedure and reduce the bloating. If you had a lower endoscopy (such as a colonoscopy or flexible sigmoidoscopy) you may notice spotting of blood in your stool or on the toilet paper. If you underwent a bowel prep for your procedure, you may not have a normal bowel movement for a few days.  Please Note:  You might notice some irritation and congestion in your nose or some drainage.  This is from the oxygen used during your procedure.  There is no need for concern and it should clear up in a day or so.  SYMPTOMS TO REPORT IMMEDIATELY:  Following lower endoscopy (colonoscopy or flexible sigmoidoscopy):  Excessive amounts of blood in the stool  Significant tenderness or worsening of abdominal pains  Swelling of the abdomen that is new, acute  Fever of 100F or higher  For urgent or emergent issues, a gastroenterologist can be reached at any hour by calling [phone]. Do not use MyChart messaging for urgent concerns.    DIET:  We do recommend a small meal at first, but then you may proceed to your regular diet.  Drink plenty of fluids but you should avoid alcoholic beverages for 24 hours.  ACTIVITY:  You should plan to take it easy for  the rest of today and you should NOT DRIVE or use heavy machinery until tomorrow (because of the sedation medicines used during the test).    FOLLOW UP: Our staff will call the number listed on your records 48-72 hours following your procedure to check on you and address any questions or concerns that you may have regarding the information given to you following your procedure. If we do not reach you, we will leave a message.  We will attempt to reach you two times.  During this call, we will ask if you have developed any symptoms of COVID 19. If you develop any symptoms (ie: fever, flu-like symptoms, shortness of breath, cough etc.) before then, please call [phone].  If you test positive for Covid 19 in the 2 weeks post procedure, please call and report this information to [place].    If any biopsies were taken you will be contacted by phone or by letter within the next 1-3 weeks.  Please call us at [phone] if you have not heard about the biopsies in 3 weeks.    SIGNATURES/CONFIDENTIALITY: You and/or your care partner have signed paperwork which will be entered into your electronic medical record.  These signatures attest to the fact that that the information above on your After Visit Summary has been reviewed and is understood.  Full responsibility of the confidentiality of this discharge information lies with you and/or your care-partner.

## 2021-08-17 NOTE — Progress Notes (Signed)
Called to room to assist during endoscopic procedure.  Patient ID and intended procedure confirmed with present staff. Received instructions for my participation in the procedure from the performing physician.  

## 2021-08-19 ENCOUNTER — Telehealth: Payer: Self-pay

## 2021-08-19 NOTE — Telephone Encounter (Signed)
  Follow up Call-  Call back number 08/17/2021  Post procedure Call Back phone  # [phone]  Permission to leave phone message Yes  Some recent data might be hidden     Patient questions:  Do you have a fever, pain , or abdominal swelling? No. Pain Score  0 *  Have you tolerated food without any problems? Yes  Have you been able to return to your normal activities? Yes.    Do you have any questions about your discharge instructions: Diet   No. Medications  No. Follow up visit  No.  Do you have questions or concerns about your Care? No.  Actions: * If pain score is 4 or above: No action needed, pain <4. Have you developed a fever since your procedure? no  2.   Have you had an respiratory symptoms (SOB or cough) since your procedure? no  3.   Have you tested positive for COVID 19 since your procedure no  4.   Have you had any family members/close contacts diagnosed with the COVID 19 since your procedure?  no   If yes to any of these questions please route to Joylene John, RN

## 2021-08-24 ENCOUNTER — Encounter: Payer: Self-pay | Admitting: Gastroenterology

## 2021-08-31 ENCOUNTER — Encounter: Payer: Self-pay | Admitting: Gastroenterology

## 2021-09-22 ENCOUNTER — Ambulatory Visit: Payer: Medicare HMO | Admitting: Internal Medicine

## 2021-09-26 DIAGNOSIS — S60511A Abrasion of right hand, initial encounter: Secondary | ICD-10-CM | POA: Diagnosis not present

## 2021-09-26 DIAGNOSIS — S62316A Displaced fracture of base of fifth metacarpal bone, right hand, initial encounter for closed fracture: Secondary | ICD-10-CM | POA: Diagnosis not present

## 2021-09-26 DIAGNOSIS — M542 Cervicalgia: Secondary | ICD-10-CM | POA: Diagnosis not present

## 2021-09-26 DIAGNOSIS — W101XXA Fall (on)(from) sidewalk curb, initial encounter: Secondary | ICD-10-CM | POA: Diagnosis not present

## 2021-09-26 DIAGNOSIS — S0990XA Unspecified injury of head, initial encounter: Secondary | ICD-10-CM | POA: Diagnosis not present

## 2021-09-26 DIAGNOSIS — R102 Pelvic and perineal pain: Secondary | ICD-10-CM | POA: Diagnosis not present

## 2021-09-26 DIAGNOSIS — R69 Illness, unspecified: Secondary | ICD-10-CM | POA: Diagnosis not present

## 2021-09-26 DIAGNOSIS — Z23 Encounter for immunization: Secondary | ICD-10-CM | POA: Diagnosis not present

## 2021-09-26 DIAGNOSIS — S3993XA Unspecified injury of pelvis, initial encounter: Secondary | ICD-10-CM | POA: Diagnosis not present

## 2021-09-26 DIAGNOSIS — Z043 Encounter for examination and observation following other accident: Secondary | ICD-10-CM | POA: Diagnosis not present

## 2021-09-26 DIAGNOSIS — S62336A Displaced fracture of neck of fifth metacarpal bone, right hand, initial encounter for closed fracture: Secondary | ICD-10-CM | POA: Diagnosis not present

## 2021-09-26 DIAGNOSIS — Y92481 Parking lot as the place of occurrence of the external cause: Secondary | ICD-10-CM | POA: Diagnosis not present

## 2021-09-26 DIAGNOSIS — M7989 Other specified soft tissue disorders: Secondary | ICD-10-CM | POA: Diagnosis not present

## 2021-09-26 DIAGNOSIS — S299XXA Unspecified injury of thorax, initial encounter: Secondary | ICD-10-CM | POA: Diagnosis not present

## 2021-09-26 DIAGNOSIS — R079 Chest pain, unspecified: Secondary | ICD-10-CM | POA: Diagnosis not present

## 2021-09-26 DIAGNOSIS — R519 Headache, unspecified: Secondary | ICD-10-CM | POA: Diagnosis not present

## 2021-09-26 DIAGNOSIS — I609 Nontraumatic subarachnoid hemorrhage, unspecified: Secondary | ICD-10-CM | POA: Diagnosis not present

## 2021-09-26 DIAGNOSIS — T1490XA Injury, unspecified, initial encounter: Secondary | ICD-10-CM | POA: Diagnosis not present

## 2021-09-30 DIAGNOSIS — S62346A Nondisplaced fracture of base of fifth metacarpal bone, right hand, initial encounter for closed fracture: Secondary | ICD-10-CM | POA: Diagnosis not present

## 2021-09-30 DIAGNOSIS — M79641 Pain in right hand: Secondary | ICD-10-CM | POA: Diagnosis not present

## 2021-10-25 DIAGNOSIS — S40012A Contusion of left shoulder, initial encounter: Secondary | ICD-10-CM | POA: Diagnosis not present

## 2021-10-25 DIAGNOSIS — M7540 Impingement syndrome of unspecified shoulder: Secondary | ICD-10-CM | POA: Insufficient documentation

## 2021-10-25 DIAGNOSIS — M25611 Stiffness of right shoulder, not elsewhere classified: Secondary | ICD-10-CM | POA: Diagnosis not present

## 2021-10-25 DIAGNOSIS — M7541 Impingement syndrome of right shoulder: Secondary | ICD-10-CM | POA: Diagnosis not present

## 2021-10-25 DIAGNOSIS — M79641 Pain in right hand: Secondary | ICD-10-CM | POA: Diagnosis not present

## 2021-10-25 DIAGNOSIS — M25511 Pain in right shoulder: Secondary | ICD-10-CM | POA: Diagnosis not present

## 2021-11-10 ENCOUNTER — Encounter: Payer: Self-pay | Admitting: Internal Medicine

## 2021-11-10 ENCOUNTER — Other Ambulatory Visit: Payer: Self-pay

## 2021-11-10 ENCOUNTER — Ambulatory Visit (INDEPENDENT_AMBULATORY_CARE_PROVIDER_SITE_OTHER): Payer: Medicare HMO | Admitting: Internal Medicine

## 2021-11-10 DIAGNOSIS — M79641 Pain in right hand: Secondary | ICD-10-CM | POA: Diagnosis not present

## 2021-11-10 DIAGNOSIS — R030 Elevated blood-pressure reading, without diagnosis of hypertension: Secondary | ICD-10-CM

## 2021-11-10 NOTE — Progress Notes (Signed)
° °  Subjective:   Patient ID: Terry Edwards [place], female    DOB: [birth year], 86 y.o.   MRN: [id number]  HPI The patient is an 86 YO female coming in for ER follow up (reviewed notes from December seen for fall and head injury and finger fracture). She is still in brace for the 5th and likely 4th metatarsal fracture. She is hoping to get those off soon. BP was elevated at ER and this made her want to get this checked. Home meter is running slightly high as well.  PMH, [facility], social history reviewed and updated  Review of Systems  Constitutional: Negative.   HENT: Negative.    Eyes: Negative.   Respiratory:  Negative for cough, chest tightness and shortness of breath.   Cardiovascular:  Negative for chest pain, palpitations and leg swelling.  Gastrointestinal:  Negative for abdominal distention, abdominal pain, constipation, diarrhea, nausea and vomiting.  Musculoskeletal:  Positive for arthralgias.  Skin: Negative.   Neurological: Negative.   Psychiatric/Behavioral: Negative.     Objective:  Physical Exam Constitutional:      Appearance: She is well-developed.  HENT:     Head: Normocephalic and atraumatic.  Cardiovascular:     Rate and Rhythm: Normal rate and regular rhythm.  Pulmonary:     Effort: Pulmonary effort is normal. No respiratory distress.     Breath sounds: Normal breath sounds. No wheezing or rales.  Abdominal:     General: Bowel sounds are normal. There is no distension.     Palpations: Abdomen is soft.     Tenderness: There is no abdominal tenderness. There is no rebound.  Musculoskeletal:        General: Tenderness present.     Cervical back: Normal range of motion.  Skin:    General: Skin is warm and dry.  Neurological:     Mental Status: She is alert and oriented to person, place, and time.     Coordination: Coordination normal.    Vitals:   11/10/21 1042  BP: 122/84  Pulse: 89  Resp: 18  SpO2: 97%  Weight: 133 lb (60.3 kg)  Height: 5\' 2"  (1.575 m)     This visit occurred during the SARS-CoV-2 public health emergency.  Safety protocols were in place, including screening questions prior to the visit, additional usage of staff PPE, and extensive cleaning of exam room while observing appropriate contact time as indicated for disinfecting solutions.   Assessment & Plan:  Visit time 20 minutes in face to face communication with patient and coordination of care, additional 15 minutes spent in record review, coordination or care, ordering tests, communicating/referring to other healthcare professionals, documenting in medical records all on the same day of the visit for total time 35 minutes spent on the visit.

## 2021-11-13 DIAGNOSIS — R030 Elevated blood-pressure reading, without diagnosis of hypertension: Secondary | ICD-10-CM | POA: Insufficient documentation

## 2021-11-13 DIAGNOSIS — M79641 Pain in right hand: Secondary | ICD-10-CM | POA: Insufficient documentation

## 2021-11-13 NOTE — Assessment & Plan Note (Signed)
BP normal in office and home meter checked during visit. We discussed with ER visit for injury and fracture it is not uncommon to have elevated readings due to pain. No medication needed and will continue to monitor BP at every visit.

## 2021-11-13 NOTE — Assessment & Plan Note (Signed)
Due to fracture in the 5th and likely 4th metatarsal. She is in brace and hoping to get this off in next week or so. Is doing stretching as she can remove at times to avoid stiffness or loss of function to other digits.

## 2021-11-17 DIAGNOSIS — S40012A Contusion of left shoulder, initial encounter: Secondary | ICD-10-CM | POA: Diagnosis not present

## 2021-11-17 DIAGNOSIS — M7540 Impingement syndrome of unspecified shoulder: Secondary | ICD-10-CM | POA: Diagnosis not present

## 2021-11-17 DIAGNOSIS — M13849 Other specified arthritis, unspecified hand: Secondary | ICD-10-CM | POA: Diagnosis not present

## 2021-11-17 DIAGNOSIS — M25611 Stiffness of right shoulder, not elsewhere classified: Secondary | ICD-10-CM | POA: Diagnosis not present

## 2021-11-17 DIAGNOSIS — M79641 Pain in right hand: Secondary | ICD-10-CM | POA: Diagnosis not present

## 2021-11-17 DIAGNOSIS — S62329A Displaced fracture of shaft of unspecified metacarpal bone, initial encounter for closed fracture: Secondary | ICD-10-CM | POA: Diagnosis not present

## 2021-11-17 DIAGNOSIS — M25511 Pain in right shoulder: Secondary | ICD-10-CM | POA: Diagnosis not present

## 2021-11-24 DIAGNOSIS — M25611 Stiffness of right shoulder, not elsewhere classified: Secondary | ICD-10-CM | POA: Diagnosis not present

## 2021-11-24 DIAGNOSIS — M25511 Pain in right shoulder: Secondary | ICD-10-CM | POA: Diagnosis not present

## 2021-11-30 DIAGNOSIS — M25611 Stiffness of right shoulder, not elsewhere classified: Secondary | ICD-10-CM | POA: Diagnosis not present

## 2021-11-30 DIAGNOSIS — M25511 Pain in right shoulder: Secondary | ICD-10-CM | POA: Diagnosis not present

## 2021-12-03 DIAGNOSIS — Z0100 Encounter for examination of eyes and vision without abnormal findings: Secondary | ICD-10-CM | POA: Diagnosis not present

## 2021-12-07 DIAGNOSIS — M25511 Pain in right shoulder: Secondary | ICD-10-CM | POA: Diagnosis not present

## 2021-12-07 DIAGNOSIS — M25611 Stiffness of right shoulder, not elsewhere classified: Secondary | ICD-10-CM | POA: Diagnosis not present

## 2021-12-14 DIAGNOSIS — M25511 Pain in right shoulder: Secondary | ICD-10-CM | POA: Diagnosis not present

## 2021-12-14 DIAGNOSIS — M25611 Stiffness of right shoulder, not elsewhere classified: Secondary | ICD-10-CM | POA: Diagnosis not present

## 2021-12-23 DIAGNOSIS — S62329A Displaced fracture of shaft of unspecified metacarpal bone, initial encounter for closed fracture: Secondary | ICD-10-CM | POA: Diagnosis not present

## 2021-12-23 DIAGNOSIS — M79641 Pain in right hand: Secondary | ICD-10-CM | POA: Diagnosis not present

## 2021-12-23 DIAGNOSIS — M25611 Stiffness of right shoulder, not elsewhere classified: Secondary | ICD-10-CM | POA: Diagnosis not present

## 2021-12-23 DIAGNOSIS — M25511 Pain in right shoulder: Secondary | ICD-10-CM | POA: Diagnosis not present

## 2021-12-23 DIAGNOSIS — S40012A Contusion of left shoulder, initial encounter: Secondary | ICD-10-CM | POA: Diagnosis not present

## 2021-12-23 DIAGNOSIS — M7540 Impingement syndrome of unspecified shoulder: Secondary | ICD-10-CM | POA: Diagnosis not present

## 2021-12-23 DIAGNOSIS — M13849 Other specified arthritis, unspecified hand: Secondary | ICD-10-CM | POA: Diagnosis not present

## 2022-01-13 DIAGNOSIS — M25511 Pain in right shoulder: Secondary | ICD-10-CM | POA: Diagnosis not present

## 2022-01-20 DIAGNOSIS — M25611 Stiffness of right shoulder, not elsewhere classified: Secondary | ICD-10-CM | POA: Diagnosis not present

## 2022-01-20 DIAGNOSIS — M7540 Impingement syndrome of unspecified shoulder: Secondary | ICD-10-CM | POA: Diagnosis not present

## 2022-01-20 DIAGNOSIS — L649 Androgenic alopecia, unspecified: Secondary | ICD-10-CM | POA: Diagnosis not present

## 2022-01-20 DIAGNOSIS — L820 Inflamed seborrheic keratosis: Secondary | ICD-10-CM | POA: Diagnosis not present

## 2022-01-20 DIAGNOSIS — M25511 Pain in right shoulder: Secondary | ICD-10-CM | POA: Diagnosis not present

## 2022-01-20 DIAGNOSIS — M79641 Pain in right hand: Secondary | ICD-10-CM | POA: Diagnosis not present

## 2022-02-03 DIAGNOSIS — M25511 Pain in right shoulder: Secondary | ICD-10-CM | POA: Diagnosis not present

## 2022-02-08 DIAGNOSIS — M25511 Pain in right shoulder: Secondary | ICD-10-CM | POA: Diagnosis not present

## 2022-02-18 DIAGNOSIS — M25511 Pain in right shoulder: Secondary | ICD-10-CM | POA: Diagnosis not present

## 2022-02-24 DIAGNOSIS — M25511 Pain in right shoulder: Secondary | ICD-10-CM | POA: Diagnosis not present

## 2022-03-03 DIAGNOSIS — M25511 Pain in right shoulder: Secondary | ICD-10-CM | POA: Diagnosis not present

## 2022-03-07 DIAGNOSIS — M79641 Pain in right hand: Secondary | ICD-10-CM | POA: Diagnosis not present

## 2022-03-07 DIAGNOSIS — M25561 Pain in right knee: Secondary | ICD-10-CM | POA: Diagnosis not present

## 2022-03-07 DIAGNOSIS — M25562 Pain in left knee: Secondary | ICD-10-CM | POA: Diagnosis not present

## 2022-03-21 DIAGNOSIS — M25561 Pain in right knee: Secondary | ICD-10-CM | POA: Diagnosis not present

## 2022-03-21 DIAGNOSIS — M7541 Impingement syndrome of right shoulder: Secondary | ICD-10-CM | POA: Diagnosis not present

## 2022-03-21 DIAGNOSIS — S40012A Contusion of left shoulder, initial encounter: Secondary | ICD-10-CM | POA: Diagnosis not present

## 2022-03-21 DIAGNOSIS — L821 Other seborrheic keratosis: Secondary | ICD-10-CM | POA: Diagnosis not present

## 2022-03-21 DIAGNOSIS — Z85828 Personal history of other malignant neoplasm of skin: Secondary | ICD-10-CM | POA: Diagnosis not present

## 2022-03-21 DIAGNOSIS — L570 Actinic keratosis: Secondary | ICD-10-CM | POA: Diagnosis not present

## 2022-03-21 DIAGNOSIS — D485 Neoplasm of uncertain behavior of skin: Secondary | ICD-10-CM | POA: Diagnosis not present

## 2022-03-21 DIAGNOSIS — D1801 Hemangioma of skin and subcutaneous tissue: Secondary | ICD-10-CM | POA: Diagnosis not present

## 2022-03-21 DIAGNOSIS — L111 Transient acantholytic dermatosis [Grover]: Secondary | ICD-10-CM | POA: Diagnosis not present

## 2022-03-21 DIAGNOSIS — M79641 Pain in right hand: Secondary | ICD-10-CM | POA: Diagnosis not present

## 2022-03-21 DIAGNOSIS — L814 Other melanin hyperpigmentation: Secondary | ICD-10-CM | POA: Diagnosis not present

## 2022-03-21 DIAGNOSIS — M25562 Pain in left knee: Secondary | ICD-10-CM | POA: Diagnosis not present

## 2022-04-08 ENCOUNTER — Telehealth: Payer: Self-pay | Admitting: Internal Medicine

## 2022-04-08 NOTE — Telephone Encounter (Signed)
N/A unable to leave a message for patient to call back to schedule Medicare Annual Wellness Visit   Last AWV  04/12/21  Please schedule at anytime with [facility] if patient calls the office back.    40 Minutes appointment  Any questions, please call me at [phone]

## 2022-04-10 IMAGING — MG MM DIGITAL SCREENING BILAT W/ TOMO AND CAD
8 series · 9 of 24 positions shown · non-contrast
Comparison: Previous exam(s).

CLINICAL DATA: Screening.

EXAM:
DIGITAL SCREENING BILATERAL MAMMOGRAM WITH TOMOSYNTHESIS AND CAD
TECHNIQUE: Bilateral screening digital craniocaudal and mediolateral oblique
mammograms were obtained. Bilateral screening digital breast
tomosynthesis was performed. The images were evaluated with
computer-aided detection.

[L CC synth-2D]
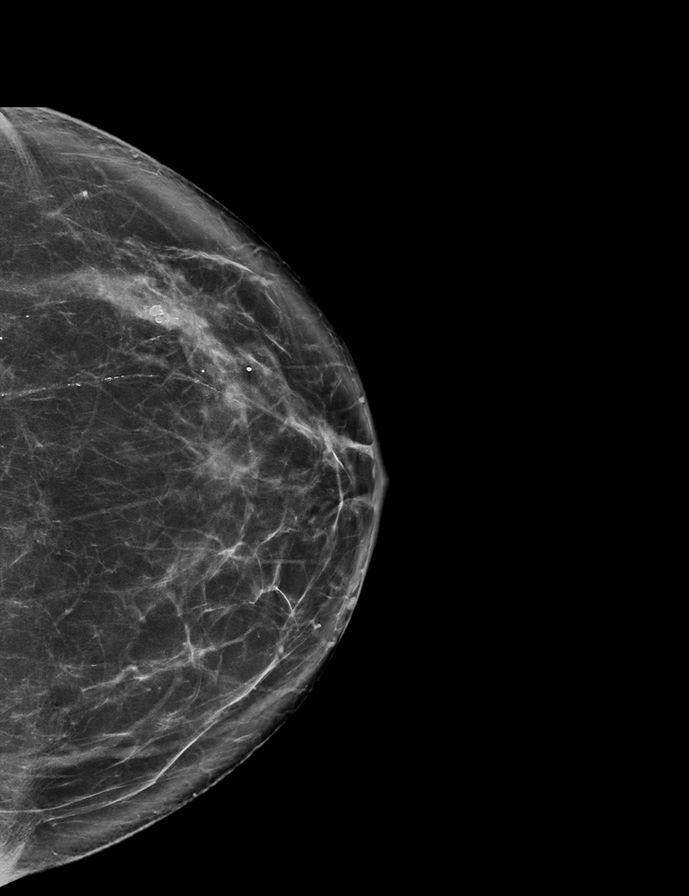

[L MLO synth-2D]
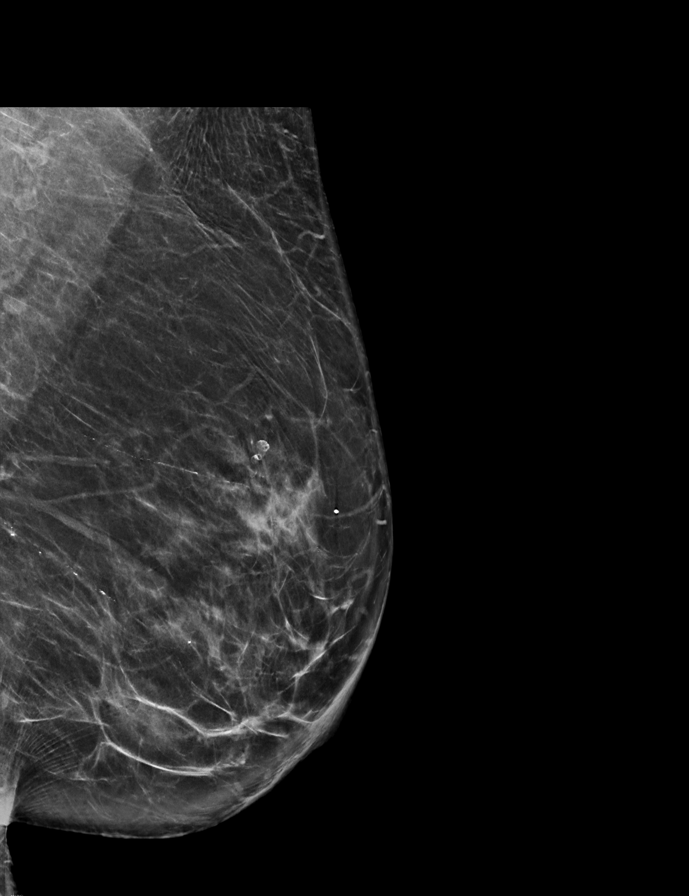

[R MLO synth-2D]
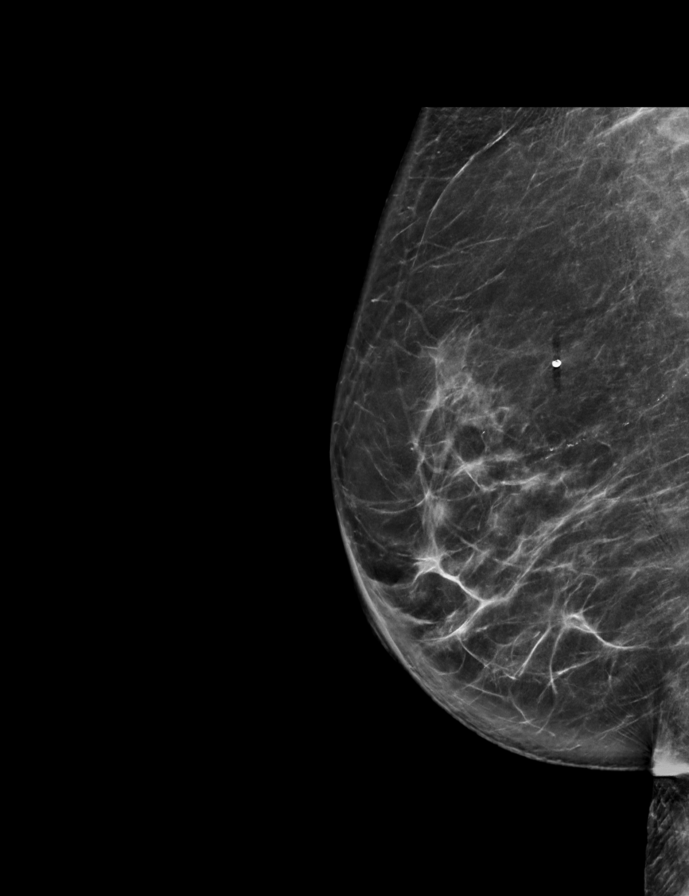

[R CC synth-2D]
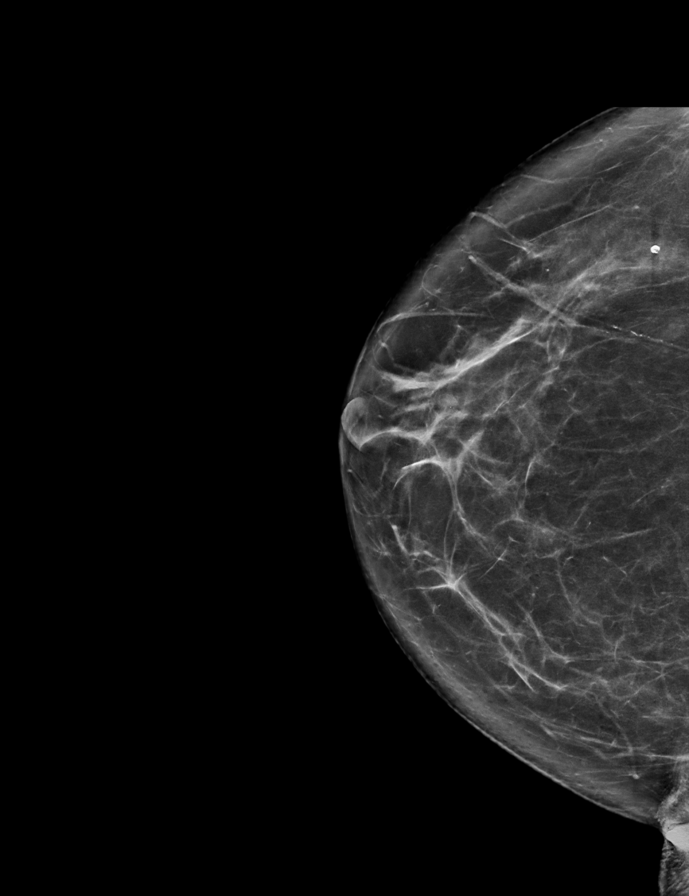

[L CC tomo · 2 of 78 frames shown]
[frame 26/78]
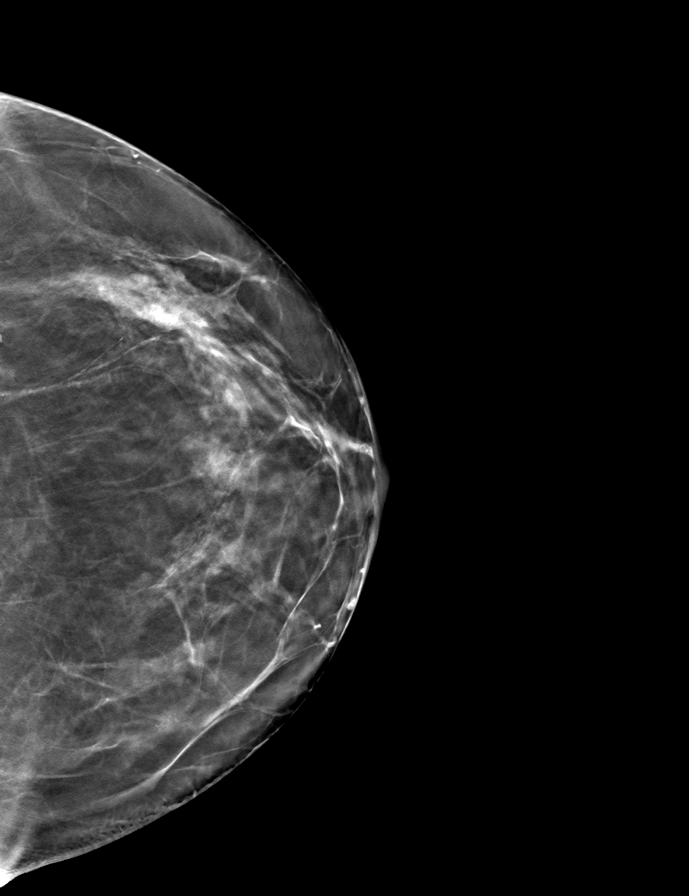
[frame 39/78]
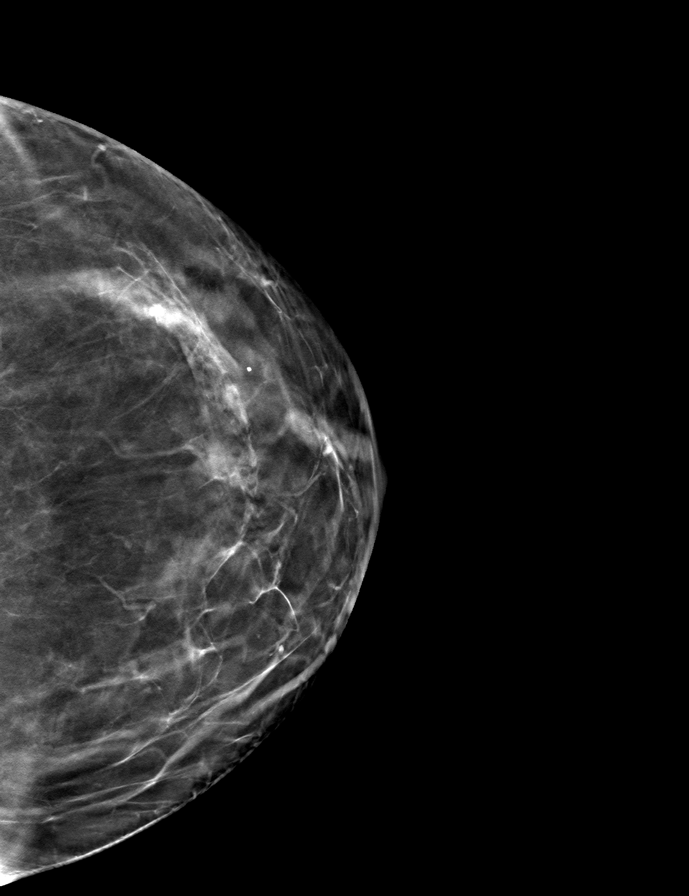

[L MLO tomo · tomo slice 39/77.0]
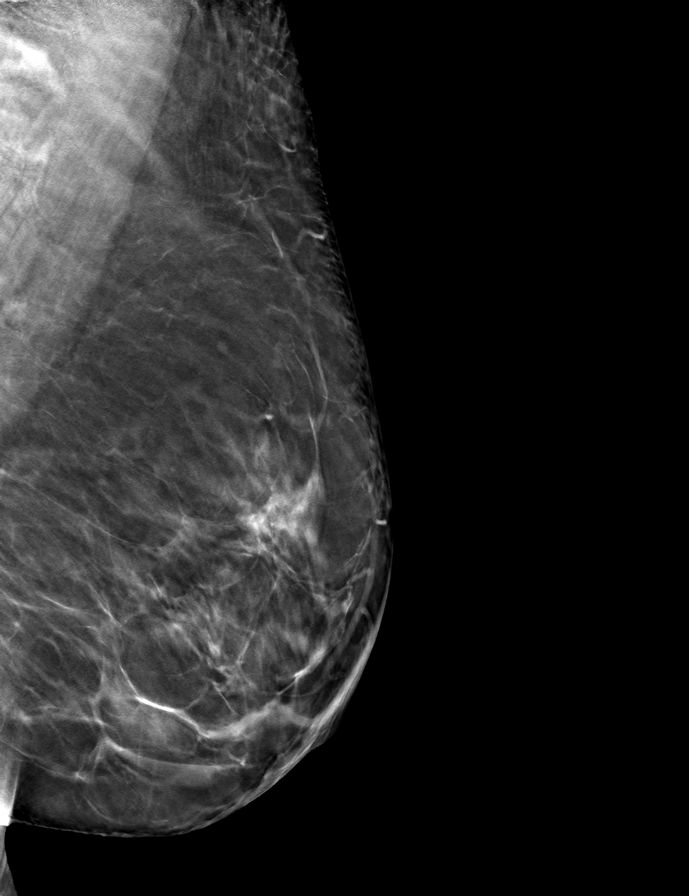

[R MLO tomo · tomo slice 39/78.0]
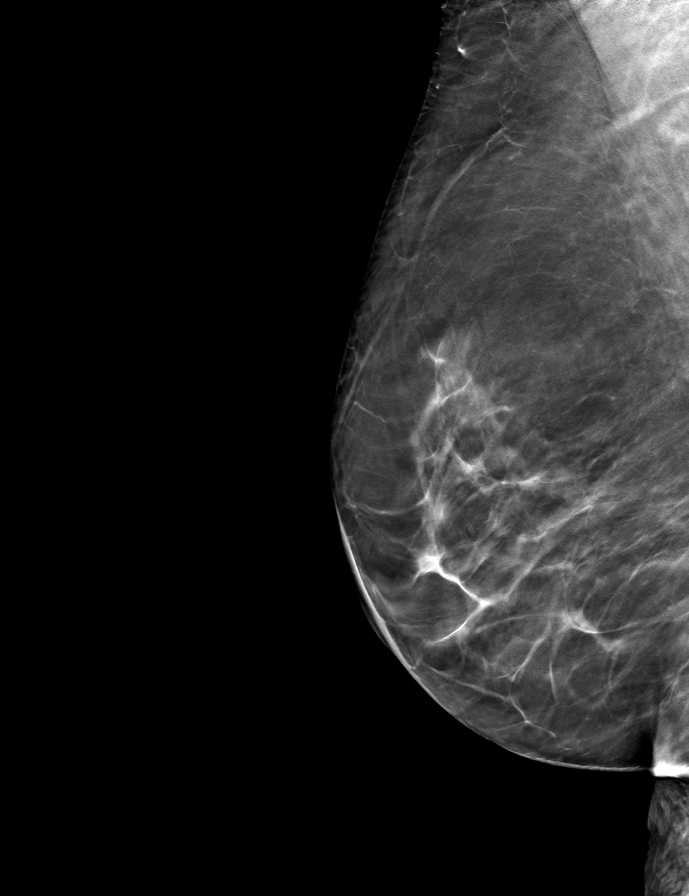

[R CC tomo · tomo slice 39/78.0]
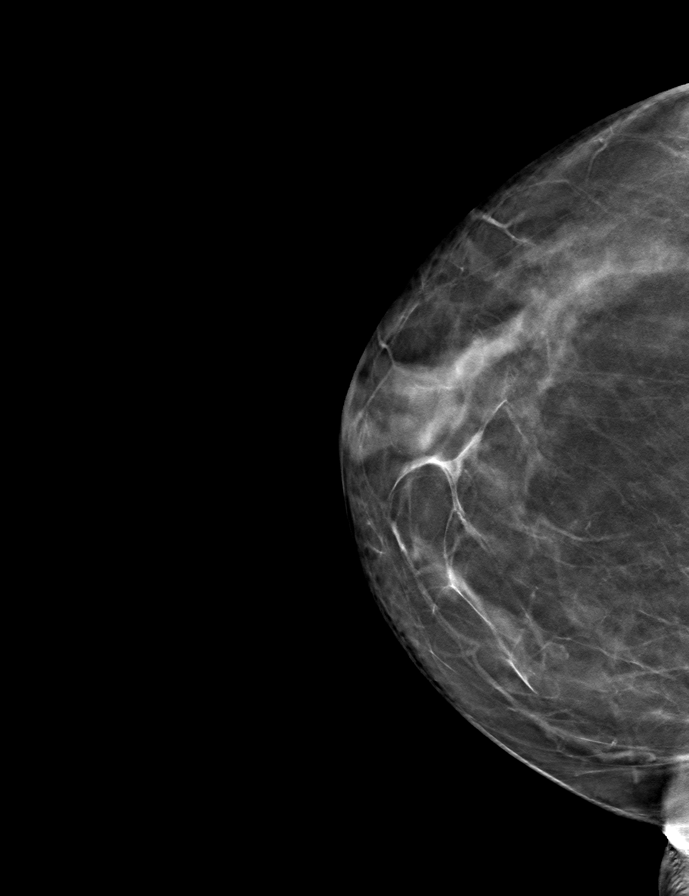

[9 of 24 positions shown; findings below may reference images not displayed]

ACR Breast Density Category b: There are scattered areas of
fibroglandular density.
FINDINGS: There are no findings suspicious for malignancy.
IMPRESSION: No mammographic evidence of malignancy. A result letter of this
screening mammogram will be mailed directly to the patient.

RECOMMENDATION:
Screening mammogram in one year. (Code:51-O-LD2)

BI-RADS CATEGORY  1: Negative.

## 2022-04-13 ENCOUNTER — Encounter: Payer: Medicare HMO | Admitting: Internal Medicine

## 2022-04-22 ENCOUNTER — Encounter: Payer: Medicare HMO | Admitting: Internal Medicine

## 2022-05-16 DIAGNOSIS — Z0000 Encounter for general adult medical examination without abnormal findings: Secondary | ICD-10-CM | POA: Diagnosis not present

## 2022-05-16 DIAGNOSIS — E7800 Pure hypercholesterolemia, unspecified: Secondary | ICD-10-CM | POA: Diagnosis not present

## 2022-05-16 DIAGNOSIS — M810 Age-related osteoporosis without current pathological fracture: Secondary | ICD-10-CM | POA: Diagnosis not present

## 2022-05-16 DIAGNOSIS — Z79899 Other long term (current) drug therapy: Secondary | ICD-10-CM | POA: Diagnosis not present

## 2022-05-16 DIAGNOSIS — R202 Paresthesia of skin: Secondary | ICD-10-CM | POA: Diagnosis not present

## 2022-05-17 DIAGNOSIS — R202 Paresthesia of skin: Secondary | ICD-10-CM | POA: Diagnosis not present

## 2022-05-17 DIAGNOSIS — E7800 Pure hypercholesterolemia, unspecified: Secondary | ICD-10-CM | POA: Diagnosis not present

## 2022-05-18 ENCOUNTER — Encounter: Payer: Medicare HMO | Admitting: Internal Medicine

## 2022-05-23 ENCOUNTER — Other Ambulatory Visit: Payer: Self-pay | Admitting: Family Medicine

## 2022-05-23 ENCOUNTER — Other Ambulatory Visit (HOSPITAL_BASED_OUTPATIENT_CLINIC_OR_DEPARTMENT_OTHER): Payer: Self-pay | Admitting: Family Medicine

## 2022-05-23 DIAGNOSIS — E785 Hyperlipidemia, unspecified: Secondary | ICD-10-CM

## 2022-06-16 DIAGNOSIS — H5203 Hypermetropia, bilateral: Secondary | ICD-10-CM | POA: Diagnosis not present

## 2022-06-22 ENCOUNTER — Ambulatory Visit (HOSPITAL_BASED_OUTPATIENT_CLINIC_OR_DEPARTMENT_OTHER)
Admission: RE | Admit: 2022-06-22 | Discharge: 2022-06-22 | Disposition: A | Discharge status: Home / Self Care | Payer: Medicare HMO | Source: Ambulatory Visit | Attending: Family Medicine | Admitting: Family Medicine

## 2022-06-22 DIAGNOSIS — E785 Hyperlipidemia, unspecified: Secondary | ICD-10-CM | POA: Insufficient documentation

## 2022-07-12 DIAGNOSIS — Z23 Encounter for immunization: Secondary | ICD-10-CM | POA: Diagnosis not present

## 2022-07-27 ENCOUNTER — Encounter: Payer: Self-pay | Admitting: Cardiovascular Disease

## 2022-07-27 ENCOUNTER — Ambulatory Visit: Payer: Medicare HMO | Attending: Cardiovascular Disease | Admitting: Cardiovascular Disease

## 2022-07-27 VITALS — BP 152/70 | HR 69 | Ht 61.0 in | Wt 128.4 lb

## 2022-07-27 DIAGNOSIS — E782 Mixed hyperlipidemia: Secondary | ICD-10-CM | POA: Diagnosis not present

## 2022-07-27 DIAGNOSIS — I7121 Aneurysm of the ascending aorta, without rupture: Secondary | ICD-10-CM | POA: Diagnosis not present

## 2022-07-27 DIAGNOSIS — R0989 Other specified symptoms and signs involving the circulatory and respiratory systems: Secondary | ICD-10-CM | POA: Diagnosis not present

## 2022-07-27 DIAGNOSIS — I712 Thoracic aortic aneurysm, without rupture, unspecified: Secondary | ICD-10-CM | POA: Insufficient documentation

## 2022-07-27 DIAGNOSIS — R011 Cardiac murmur, unspecified: Secondary | ICD-10-CM

## 2022-07-27 NOTE — Assessment & Plan Note (Signed)
History of hyperlipidemia with lipid profile performed 05/17/2022 revealing total cholesterol 248, LDL 158 and HDL 57.  Given her age of 74, lack of symptoms and a coronary calcium score 0 I do not feel compelled to treat her pharmacologically at this point.

## 2022-07-27 NOTE — Patient Instructions (Signed)
Medication Instructions:  Your physician recommends that you continue on your current medications as directed. Please refer to the Current Medication list given to you today.  *If you need a refill on your cardiac medications before your next appointment, please call your pharmacy*   Testing/Procedures: Your physician has requested that you have an echocardiogram. Echocardiography is a painless test that uses sound waves to create images of your heart. It provides your doctor with information about the size and shape of your heart and how well your heart's chambers and valves are working. This procedure takes approximately one hour. There are no restrictions for this procedure. Please do NOT wear cologne, perfume, aftershave, or lotions (deodorant is allowed). Please arrive 15 minutes prior to your appointment time. This procedure will be done at [address] [place] has requested that you have a carotid duplex. This test is an ultrasound of the carotid arteries in your neck. It looks at blood flow through these arteries that supply the brain with blood. Allow one hour for this exam. There are no restrictions or special instructions. This procedure will be done at [address]    Follow-Up: At [facility], you and your health needs are our priority.  As part of our continuing mission to provide you with exceptional heart care, we have created designated Provider Care Teams.  These Care Teams include your primary Cardiologist (physician) and Advanced Practice Providers (APPs -  Physician Assistants and Nurse Practitioners) who all work together to provide you with the care you need, when you need it.  We recommend signing up for the patient portal called "MyChart".  Sign up information is provided on this After Visit Summary.  MyChart is used to connect with patients for Virtual Visits (Telemedicine).  Patients are able to view lab/test results,  encounter notes, upcoming appointments, etc.  Non-urgent messages can be sent to your provider as well.   To learn more about what you can do with MyChart, go to [email or web address].    Your next appointment:   We will see you on an as needed basis.  Provider:   Quay Burow, MD

## 2022-07-27 NOTE — Assessment & Plan Note (Signed)
Soft outflow tract murmur consistent with aortic sclerosis.  We will check a 2D echo to confirm.

## 2022-07-27 NOTE — Assessment & Plan Note (Signed)
Recent coronary calcium score was 0 with incidentally noted 40 mm a sending thoracic aorta probably within normal limits.  No follow-up required given patient's age.

## 2022-07-27 NOTE — Progress Notes (Signed)
07/27/2022 Terry Edwards   [birth year]  [id number]  Primary Physician Hoyt Koch, MD Primary Cardiologist: Lorretta Harp MD Lupe Carney, [place]  HPI:  Terry Edwards is a 86 y.o. thin-appearing widowed Caucasian female mother of 2 living children (2 deceased), grandmother to 6 grandchildren who was referred by Dr. Lindell Noe, her PCP, for a dilated thoracic aorta.  She is retired from being in Academic librarian.  She is on no medications.  She does have untreated hyperlipidemia.  Her father had bypass surgery in his late 39s.  She is never had a heart attack or stroke.  She denies chest pain or shortness of breath.  She is fairly active and walks 3 to 4 miles a day and works out in [employer] as well.  She had a coronary calcium score performed 06/22/2022 which was 0 with incidentally noted mild dilatation of the ascending thoracic aorta measuring 40 mm.  Her aortic valve calcium score was 78.   Current Meds  Medication Sig   Ascorbic Acid (VITAMIN C) 1000 MG tablet Take 1,000 mg by mouth daily.   Biotin 5000 MCG TABS Take by mouth daily.    Calcium-Magnesium (CAL-MAG PO) Take by mouth.   Cholecalciferol (VITAMIN D3) 5000 UNITS TABS Take 1 tablet by mouth daily.   Omega-3 Fatty Acids (FISH OIL) 1000 MG CPDR Take by mouth daily.   Turmeric (QC TUMERIC COMPLEX PO) Take 1,000 mg by mouth 2 (two) times daily.     Allergies  Allergen Reactions   Aricept [Donepezil] Rash   Fosamax [Alendronate] Rash    Social History   Socioeconomic History   Marital status: Widowed    Spouse name: Not on file   Number of children: Not on file   Years of education: Not on file   Highest education level: Not on file  Occupational History   Not on file  Tobacco Use   Smoking status: Never   Smokeless tobacco: Never   Tobacco comments:    widowed, lives alone. 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 1957      Do you live in a house, apartment, assisted living, condo, trailer, etc.? House (townhouse)      Is it one or more stories? One      How many persons live in your home? 1         Do you have any pets in your home?(please list) None      Highest level of education completed: BS      Current or past profession: Scientific laboratory technician      Do you exercise?: Yes    Type and how often: 2 times a week at gym walk 2-3 times a week      Do you have a Living Will? (Form that indicates scenarios where you would not want your life prolonged) Yes      Do you have a DNR form?  If not, would you like to discuss one? Yes      Do you have signed POA/HPOA forms? Yes      Do you have difficulty bathing or dressing yourself? No      Do you have difficulty preparing food or eating? No      Do you have difficulty managing medications? No      Do you have difficulty managing your finances? No      Do you have difficulty affording your medications? No                  Social Determinants of Radio broadcast assistant Strain: Not on file  Food Insecurity: Not on file  Transportation Needs: Not on file  Physical Activity: Not on file  Stress: Not on file  Social Connections: Not on file  Intimate Partner Violence: Not on file     Review of Systems: General: negative for chills, fever, night sweats or weight changes.  Cardiovascular: negative for chest pain, dyspnea on exertion, edema, orthopnea, palpitations, paroxysmal nocturnal dyspnea or shortness of breath Dermatological: negative for rash Respiratory: negative for cough or wheezing Urologic: negative for  hematuria Abdominal: negative for nausea, vomiting, diarrhea, bright red blood per rectum, melena, or hematemesis Neurologic: negative for visual changes, syncope, or dizziness All other systems reviewed and are otherwise negative except as noted above.    Blood pressure (!) 152/70, pulse 69, height '5\' 1"'$  (1.549 m), weight 128 lb 6.4 oz (58.2 kg), SpO2 99 %.  General appearance: alert and no distress Neck: no adenopathy, no JVD, supple, symmetrical, trachea midline, thyroid not enlarged, symmetric, no tenderness/mass/nodules, and right carotid Rhondalyn Clingan Lungs: clear to auscultation bilaterally Heart: Soft outflow tract murmur consistent with aortic sclerosis Extremities: extremities normal, atraumatic, no cyanosis or edema Pulses: 2+ and symmetric Skin: Skin color, texture, turgor normal. No rashes or lesions Neurologic: Grossly normal  EKG sinus rhythm at 69 without ST or T wave changes.  Personally reviewed this EKG.  ASSESSMENT AND PLAN:   Hyperlipidemia History of hyperlipidemia with lipid profile performed 05/17/2022 revealing total cholesterol 248, LDL 158 and HDL 57.  Given her age of 67, lack of symptoms and a coronary calcium score 0 I do not feel compelled to treat her pharmacologically at this point.  Murmur, cardiac Soft outflow tract murmur consistent with aortic sclerosis.  We will check a 2D echo to confirm.  Thoracic aortic aneurysm [facility]) Recent coronary calcium score was 0 with incidentally noted 40 mm a sending thoracic aorta probably within normal limits.  No follow-up required given patient's age.     Lorretta Harp MD FACP,FACC,FAHA, [facility] 07/27/2022 9:55 AM

## 2022-08-03 ENCOUNTER — Ambulatory Visit (HOSPITAL_COMMUNITY)
Admission: RE | Admit: 2022-08-03 | Discharge: 2022-08-03 | Disposition: A | Discharge status: Home / Self Care | Payer: Medicare HMO | Source: Ambulatory Visit | Attending: Internal Medicine | Admitting: Internal Medicine

## 2022-08-03 DIAGNOSIS — R011 Cardiac murmur, unspecified: Secondary | ICD-10-CM

## 2022-08-03 DIAGNOSIS — I7121 Aneurysm of the ascending aorta, without rupture: Secondary | ICD-10-CM

## 2022-08-03 DIAGNOSIS — R0989 Other specified symptoms and signs involving the circulatory and respiratory systems: Secondary | ICD-10-CM

## 2022-08-03 DIAGNOSIS — E782 Mixed hyperlipidemia: Secondary | ICD-10-CM | POA: Diagnosis not present

## 2022-08-08 ENCOUNTER — Ambulatory Visit (INDEPENDENT_AMBULATORY_CARE_PROVIDER_SITE_OTHER): Payer: Medicare HMO

## 2022-08-08 DIAGNOSIS — R011 Cardiac murmur, unspecified: Secondary | ICD-10-CM | POA: Diagnosis not present

## 2022-08-08 DIAGNOSIS — I7121 Aneurysm of the ascending aorta, without rupture: Secondary | ICD-10-CM | POA: Diagnosis not present

## 2022-08-08 DIAGNOSIS — E782 Mixed hyperlipidemia: Secondary | ICD-10-CM

## 2022-08-08 LAB — ECHOCARDIOGRAM COMPLETE
AV Vena cont: 0.24 cm
Area-P 1/2: 3.42 cm2
P 1/2 time: 861 msec
S' Lateral: 2.35 cm

## 2022-10-03 DIAGNOSIS — Z9289 Personal history of other medical treatment: Secondary | ICD-10-CM

## 2022-10-03 HISTORY — DX: Personal history of other medical treatment: Z92.89

## 2022-11-09 DIAGNOSIS — M25512 Pain in left shoulder: Secondary | ICD-10-CM | POA: Diagnosis not present

## 2022-12-27 ENCOUNTER — Ambulatory Visit (INDEPENDENT_AMBULATORY_CARE_PROVIDER_SITE_OTHER): Payer: Medicare HMO | Admitting: Obstetrics and Gynecology

## 2022-12-27 ENCOUNTER — Other Ambulatory Visit (HOSPITAL_COMMUNITY)
Admission: RE | Admit: 2022-12-27 | Discharge: 2022-12-27 | Disposition: A | Discharge status: Home / Self Care | Payer: Medicare HMO | Source: Ambulatory Visit | Attending: Obstetrics and Gynecology | Admitting: Obstetrics and Gynecology

## 2022-12-27 ENCOUNTER — Encounter: Payer: Self-pay | Admitting: Obstetrics and Gynecology

## 2022-12-27 VITALS — BP 170/79 | HR 78 | Ht 60.6 in | Wt 129.4 lb

## 2022-12-27 DIAGNOSIS — R82998 Other abnormal findings in urine: Secondary | ICD-10-CM | POA: Diagnosis not present

## 2022-12-27 DIAGNOSIS — R030 Elevated blood-pressure reading, without diagnosis of hypertension: Secondary | ICD-10-CM

## 2022-12-27 DIAGNOSIS — R319 Hematuria, unspecified: Secondary | ICD-10-CM

## 2022-12-27 DIAGNOSIS — R350 Frequency of micturition: Secondary | ICD-10-CM | POA: Insufficient documentation

## 2022-12-27 DIAGNOSIS — N952 Postmenopausal atrophic vaginitis: Secondary | ICD-10-CM | POA: Diagnosis not present

## 2022-12-27 DIAGNOSIS — N3281 Overactive bladder: Secondary | ICD-10-CM | POA: Diagnosis not present

## 2022-12-27 LAB — URINALYSIS, ROUTINE W REFLEX MICROSCOPIC
Bilirubin Urine: NEGATIVE
Glucose, UA: NEGATIVE mg/dL
Ketones, ur: NEGATIVE mg/dL
Nitrite: NEGATIVE
Protein, ur: NEGATIVE mg/dL
Specific Gravity, Urine: 1.017 (ref 1.005–1.030)
pH: 6 (ref 5.0–8.0)

## 2022-12-27 LAB — POCT URINALYSIS DIPSTICK
Bilirubin, UA: NEGATIVE
Glucose, UA: NEGATIVE
Ketones, UA: NEGATIVE
Nitrite, UA: NEGATIVE
Protein, UA: NEGATIVE
Spec Grav, UA: >=1.03 — AB (ref 1.010–1.025)
Urobilinogen, UA: 0.2 E.U./dL
pH, UA: 6.5 (ref 5.0–8.0)

## 2022-12-27 NOTE — Progress Notes (Signed)
[facility] New Patient Evaluation and Consultation  Referring Provider: Glenis Smoker, * PCP: Glenis Smoker, MD Date of Service: 12/27/2022  SUBJECTIVE Chief Complaint: New Patient (Initial Visit) Terry Edwards is a 87 y.o. female here for a consult for urinary incontinence.)  History of Present Illness: Terry Edwards is a 87 y.o. White or Caucasian female presenting for evaluation of incontinence.    Urinary Symptoms: Leaks urine with with movement to the bathroom and with urgency Leaks 7-10 time(s) per week.  Pad use: None  She is bothered by her UI symptoms.  Day time voids 8-9.  Nocturia: 0-2 times per night to void. Voiding dysfunction: she does not empty her bladder well.  does not use a catheter to empty bladder.  When urinating, she feels she has no difficulties Drinks: Chai Tea in AM, approx 40 oz water per day, and herbal tea at night per day  UTIs:  0  UTI's in the last year.   Denies history of blood in urine and kidney or bladder stones  Pelvic Organ Prolapse Symptoms:                  She Denies a feeling of a bulge the vaginal area.   Bowel Symptom: Bowel movements: 1-2 time(s) per day Stool consistency: hard Straining: yes.  Splinting: yes.  Incomplete evacuation: no.  She Denies accidental bowel leakage / fecal incontinence Bowel regimen: diet   Sexual Function Sexually active: no.   Pelvic Pain Denies pelvic pain   Past Medical History:  Past Medical History:  Diagnosis Date   Allergy    Arthritis    Per [facility] new patient    Cancer ([facility])    squamous cell carcinoma removed from Left leg 2022   Cataract    removed bilateral eyes   Chronic hepatitis B without hepatic coma ([facility]) 1970s   chronic elev AFP   Cyst of pineal gland    incidental on MRI   Heart murmur    Hemorrhoids    History of chicken pox    History of depression 2006   situational, on Lexapro for 1 year   Hyperlipidemia    Osteopenia     hx fx 5th MT R foot 03/2013   Plantar fasciitis of left foot    Per [facility] new patient packet   Right rotator cuff tendonitis 2013   resolved with PT (injections not helpful)   Urine incontinence    Vitamin D deficiency      Past Surgical History:   Past Surgical History:  Procedure Laterality Date   APPENDECTOMY  1957   BLEPHAROPLASTY     Per [facility] new patient packet   COLONOSCOPY     EYE SURGERY Bilateral 03/09/15, 03/23/15   cataract removal   TONSILLECTOMY  1940's     Past OB/GYN History: OB History  Gravida Para Term Preterm AB Living  4         4  SAB IAB Ectopic Multiple Live Births          4    # Outcome Date GA Lbr Len/2nd Weight Sex Delivery Anes PTL Lv  4 Gravida           3 Gravida           2 Gravida           1 Gravida             Vaginal deliveries: 4,  Forceps/ Vacuum deliveries:  0, Cesarean section: 0 Menopausal: Yes, at age 22's Last pap smear was >10 Years.  Any history of abnormal pap smears: no.   Medications: She has a current medication list which includes the following prescription(s): vitamin c, biotin, calcium-magnesium, vitamin d3, fish oil, and turmeric.   Allergies: Patient is allergic to aricept [donepezil] and fosamax [alendronate].   Social History:  Social History   Tobacco Use   Smoking status: Never   Smokeless tobacco: Never   Tobacco comments:    widowed, lives alone. Former [employer]ar[employer] manager. moved to [place] from [place] 09/2013  Vaping Use   Vaping Use: Never used  Substance Use Topics   Alcohol use: Not Currently    Comment: 0-1 drink per day   Drug use: No    Relationship status: widowed She lives alone She is not employed. Regular exercise: Yes: walking, gym History of abuse: No  Family History:   Family History  Problem Relation Age of Onset   Hypertension Mother    Hyperlipidemia Mother    Myelodysplastic syndrome Mother    Hypertension Father    Hyperlipidemia Father    Diabetes Father    Suicidality Brother     Pneumonia Son    Parkinson's disease Son    Breast cancer Neg Hx    Colon cancer Neg Hx    Esophageal cancer Neg Hx    Rectal cancer Neg Hx    Stomach cancer Neg Hx      Review of Systems: Review of Systems  Constitutional:  Negative for fever, malaise/fatigue and weight loss.  Respiratory:  Negative for cough, shortness of breath and wheezing.   Cardiovascular:  Negative for chest pain, palpitations and leg swelling.  Gastrointestinal:  Negative for abdominal pain and blood in stool.  Genitourinary:  Negative for dysuria.  Musculoskeletal:  Negative for myalgias.  Skin:  Negative for rash.  Neurological:  Negative for dizziness and headaches.  Endo/Heme/Allergies:  Does not bruise/bleed easily.  Psychiatric/Behavioral:  Negative for depression. The patient is not nervous/anxious.      OBJECTIVE Physical Exam: Vitals:   12/27/22 0834 12/27/22 0835  BP: (!) 171/76 (!) 170/79  Pulse: 83 78  Weight: 129 lb 6.4 oz (58.7 kg)   Height: 5' 0.6" (1.539 m)     Physical Exam   GU / Detailed Urogynecologic Evaluation:  Pelvic Exam: Normal external female genitalia; Bartholin's and Skene's glands normal in appearance; urethral meatus normal in appearance, no urethral masses or discharge.   CST: negative  Speculum exam reveals normal vaginal mucosa with atrophy. Cervix normal appearance. Uterus normal single, nontender. Adnexa normal adnexa.     Pelvic floor strength I/V,   Pelvic floor musculature: Right levator non-tender, Right obturator non-tender, Left levator non-tender, Left obturator non-tender  POP-Q:   POP-Q  -2                                            Aa   -2                                           Ba  -6.5  C   2                                            Gh  4.5                                            Pb  8.5                                            tvl   -2.5                                             Ap  -2.5                                            Bp  -8                                              D      Rectal Exam:  Normal sphincter tone, no distal rectocele, enterocoele not present, no rectal masses, no sign of dyssynergia when asking the patient to bear down.  Post-Void Residual (PVR) by Bladder Scan: In order to evaluate bladder emptying, we discussed obtaining a postvoid residual and she agreed to this procedure.  Procedure: The ultrasound unit was placed on the patient's abdomen in the suprapubic region after the patient had voided. A PVR of 11 ml was obtained by bladder scan.  Laboratory Results:  POC Urine: Positive for small blood and trace leukocytes   ASSESSMENT AND PLAN [name] is a 87 y.o. with:  1. Urinary frequency   2. Overactive bladder   3. Hematuria, unspecified type   4. Leukocytes in urine   5. Elevated blood pressure reading in office without diagnosis of hypertension   6. Vaginal atrophy    OAB - Discussed options with patient including medications, PTNS, Pelvic floor physical therapy, SNM, and Bladder botox. Patient is interested in PTNS, but will be taking a trip to see her son in [place] in June, so would be interested in starting this after she returns.  - Patient is interested in starting physical therapy. Will send Referral for pelvic floor PT. Will have her return after PT to see if she wants to proceed with the PTNS.  2. Blood in urine/ leukocytes - Will send urine for microscopic and culture as it was positive for blood and trace leukocytes.   3. Vaginal atrophy - On exam patient had vaginal dryness. She was encouraged to use coconut oil or vitamin e oil to provide lubrication to the vagina.   4. HTN - Of note, patient had an elevated blood pressure reading in the office x2. She is followed by cardiology and is not currently on any medications for her blood pressure.  Patient to return in 3 months to evaluate if PT has helped  and if she would like to start PTNS.   Jaquita Folds, MD

## 2022-12-27 NOTE — Patient Instructions (Addendum)
We discussed the symptoms of overactive bladder (OAB), which include urinary urgency, urinary frequency, night-time urination, with or without urge incontinence.  We discussed management including behavioral therapy (decreasing bladder irritants by following a bladder diet, urge suppression strategies, timed voids, bladder retraining), physical therapy, medication; and for refractory cases posterior tibial nerve stimulation, sacral neuromodulation, and intravesical botulinum toxin injection.  

## 2022-12-28 LAB — URINE CULTURE: Culture: NO GROWTH

## 2023-01-04 DIAGNOSIS — R030 Elevated blood-pressure reading, without diagnosis of hypertension: Secondary | ICD-10-CM | POA: Diagnosis not present

## 2023-02-01 ENCOUNTER — Other Ambulatory Visit: Payer: Self-pay

## 2023-02-01 ENCOUNTER — Ambulatory Visit: Payer: Medicare HMO | Attending: Obstetrics and Gynecology | Admitting: Physical Therapy

## 2023-02-01 DIAGNOSIS — R293 Abnormal posture: Secondary | ICD-10-CM | POA: Diagnosis not present

## 2023-02-01 DIAGNOSIS — R279 Unspecified lack of coordination: Secondary | ICD-10-CM

## 2023-02-01 DIAGNOSIS — M6281 Muscle weakness (generalized): Secondary | ICD-10-CM

## 2023-02-01 NOTE — Therapy (Signed)
OUTPATIENT PHYSICAL THERAPY FEMALE PELVIC EVALUATION   Patient Name: Terry Edwards MRN: [id number] DOB:Jul 18, 1936, 87 y.o., female Today's Date: 02/01/2023  END OF SESSION:  PT End of Session - 02/01/23 1533     Visit Number 1    Date for PT Re-Evaluation 05/04/23    Authorization Type [employer]na medicare    Progress Note Due on Visit 10    PT Start Time 1445    PT Stop Time 1518    PT Time Calculation (min) 33 min             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   Cataract    removed bilateral eyes   Chronic hepatitis B without hepatic coma ([facility]) 1970s   chronic elev AFP   Cyst of pineal gland    incidental on MRI   Heart murmur    Hemorrhoids    History of chicken pox    History of depression 2006   situational, on Lexapro for 1 year   Hyperlipidemia    Osteopenia    hx fx 5th MT R foot 03/2013   Plantar fasciitis of left foot    Per [facility] new patient packet   Right rotator cuff tendonitis 2013   resolved with PT (injections not helpful)   Urine incontinence    Vitamin D deficiency    Past Surgical History:  Procedure Laterality Date   APPENDECTOMY  1957   BLEPHAROPLASTY     Per [facility] new patient packet   COLONOSCOPY     EYE SURGERY Bilateral 03/09/15, 03/23/15   cataract removal   TONSILLECTOMY  1940's   Patient Active Problem List   Diagnosis Date Noted   Thoracic aortic aneurysm ([facility]) 07/27/2022   Elevated blood pressure reading without diagnosis of hypertension 11/13/2021   Right hand pain 11/13/2021   AF (amaurosis fugax) 07/09/2021   Drug-induced skin rash 04/23/2021   Cyst of joint of hand, right 01/26/2021   Thinning hair 10/12/2020   Murmur, cardiac 10/12/2020   Traumatic incomplete tear of right rotator cuff 10/12/2020   Urge incontinence 10/12/2020   Routine general medical examination at a health care facility 01/09/2015    Chronic toe pain, right foot 09/03/2014   Cyst of pineal gland    Vitamin D deficiency    Osteoporosis    Hx of type B viral hepatitis    Hyperlipidemia     PCP: Garth Bigness, MD  REFERRING PROVIDER: Marguerita Beards, MD  REFERRING DIAG: N32.81 (ICD-10-CM) - Overactive bladder  THERAPY DIAG:  Muscle weakness (generalized)  Abnormal posture  Unspecified lack of coordination  Rationale for Evaluation and Treatment: Rehabilitation  ONSET DATE: years  SUBJECTIVE:  SUBJECTIVE STATEMENT: Pt has urgency incontinence. Reports caffeine worsens it  Fluid intake: Yes: water - 25oz day, chai tea in the morning    PAIN:  Are you having pain? No  PRECAUTIONS: None  WEIGHT BEARING RESTRICTIONS: No  FALLS:  Has patient fallen in last 6 months? No  LIVING ENVIRONMENT: Lives with: lives alone Lives in: House/apartment   OCCUPATION: retired   PLOF: Independent  PATIENT GOALS: to have less leakage  PERTINENT HISTORY:  squamous cell carcinoma removed from Left leg, Right rotator cuff tendonitis, Plantar fasciitis of left foot, depression,  Osteopenia, Hemorrhoids,  Chronic hepatitis B without hepatic coma      Sexual abuse: No  BOWEL MOVEMENT: Pain with bowel movement: No Type of bowel movement:Type (Bristol Stool Scale) type 2-4, Frequency daily, and Strain Yes to get started  Fully empty rectum: Yes:   Leakage: No Pads: No Fiber supplement: No  URINATION: Pain with urination: No Fully empty bladder: No  -not always Stream: Weak Urgency: Yes:   Frequency: in the am about every 1 hour, sometimes 1x night.  Leakage: Urge to void Pads: No  INTERCOURSE: Pain with intercourse:  not active   PREGNANCY: Vaginal deliveries 4 Tearing No C-section deliveries 0 Currently  pregnant No  PROLAPSE: None   OBJECTIVE:   DIAGNOSTIC FINDINGS:   COGNITION: Overall cognitive status: Within functional limits for tasks assessed     SENSATION: Light touch: Appears intact Proprioception: Appears intact  MUSCLE LENGTH: Bil hamstrings and adductors limited by 25%   POSTURE: rounded shoulders and posterior pelvic tilt  PELVIC ALIGNMENT: WFL  LUMBARAROM/PROM:  A/PROM A/PROM  eval  Flexion Limited by 50%  Extension WFL  Right lateral flexion Limited by 25%  Left lateral flexion Limited by 25%  Right rotation WFL  Left rotation WFL   (Blank rows = not tested)  LOWER EXTREMITY ROM:  WFL  LOWER EXTREMITY MMT:  Bil hips grossly 4/5, knees 5/5  PALPATION:   General  no TTP                 External Perineal Exam no TTP, dryness noted externally                              Internal Pelvic Floor no TTP  Patient confirms identification and approves PT to assess internal pelvic floor and treatment Yes  PELVIC MMT:   MMT eval  Vaginal 2/5 initially improved to 3/5 with cues; 4s; 5 reps  Internal Anal Sphincter   External Anal Sphincter   Puborectalis   Diastasis Recti   (Blank rows = not tested)        TONE: WFL  PROLAPSE: Not seen in hooklying   TODAY'S TREATMENT:  DATE:   02/01/23 EVAL Examination completed, findings reviewed, pt educated on POC, HEP, and urge drill. Pt motivated to participate in PT and agreeable to attempt recommendations.     PATIENT EDUCATION:  Education details: [id number] Person educated: Patient Education method: Explanation, Demonstration, Tactile cues, Verbal cues, and Handouts Education comprehension: verbalized understanding and returned demonstration  HOME EXERCISE PROGRAM: [id number]  ASSESSMENT:  CLINICAL IMPRESSION: Patient is a 87 y.o. female  who was seen today for  physical therapy evaluation and treatment for urge incontinence. Pt reports this is the only time she has leakage. Pt also reports she has cut back fluids a lot due to leakage and is fearful of increasing them. Pt found to have decreased flexibility at spine and bil hips, mild decreased hip strength. Patient consented to internal pelvic floor assessment vaginally this date and found to have decreased strength, endurance, and coordination. Patient benefited from verbal cues for improved technique with pelvic floor contractions. Pt educated on HEP and urge drill to start at home. Pt would benefit from additional PT to further address deficits.       OBJECTIVE IMPAIRMENTS: decreased coordination, decreased endurance, decreased mobility, decreased strength, impaired flexibility, improper body mechanics, and postural dysfunction.   ACTIVITY LIMITATIONS: continence  PARTICIPATION LIMITATIONS: community activity  PERSONAL FACTORS: Time since onset of injury/illness/exacerbation are also affecting patient's functional outcome.   REHAB POTENTIAL: Good  CLINICAL DECISION MAKING: Stable/uncomplicated  EVALUATION COMPLEXITY: Low   GOALS: Goals reviewed with patient? Yes  SHORT TERM GOALS: Target date: 03/01/23  Pt to be I with HEP.  Baseline: Goal status: INITIAL  2.  Pt will have 25% less urgency due to bladder retraining and strengthening  Baseline:  Goal status: INITIAL  3.  Pt to be I with breathing mechanics and pelvic floor coordination for decreased leakage.  Baseline:  Goal status: INITIAL   LONG TERM GOALS: Target date: 05/04/23  Pt to be I with advanced HEP.  Baseline:  Goal status: INITIAL  2.  Pt will have 50% less urgency due to bladder retraining and strengthening  Baseline:  Goal status: INITIAL  3.  Pt will have 50% reduced leakage during a typical day for improved QOL Baseline:  Goal status: INITIAL  4.  Pt to demonstrate at least 4/5 pelvic floor strength for  improved pelvic stability and decreased strain at pelvic floor/ decrease leakage.  Baseline:  Goal status: INITIAL    PLAN:  PT FREQUENCY: 1x/week  PT DURATION:  6 sessions  PLANNED INTERVENTIONS: Therapeutic exercises, Therapeutic activity, Neuromuscular re-education, Balance training, Patient/Family education, Self Care, Joint mobilization, DME instructions, Dry Needling, Spinal mobilization, Cryotherapy, Moist heat, Taping, Biofeedback, and Manual therapy  PLAN FOR NEXT SESSION: review urge drill if needed, pelvic floor strengthening, core/hip strengthening  Otelia Sergeant, PT, DPT 05/01/243:34 PM

## 2023-02-01 NOTE — Patient Instructions (Signed)

## 2023-02-08 ENCOUNTER — Ambulatory Visit: Payer: Medicare HMO | Admitting: Physical Therapy

## 2023-02-08 DIAGNOSIS — R293 Abnormal posture: Secondary | ICD-10-CM | POA: Diagnosis not present

## 2023-02-08 DIAGNOSIS — R279 Unspecified lack of coordination: Secondary | ICD-10-CM | POA: Diagnosis not present

## 2023-02-08 DIAGNOSIS — M6281 Muscle weakness (generalized): Secondary | ICD-10-CM | POA: Diagnosis not present

## 2023-02-08 NOTE — Therapy (Signed)
OUTPATIENT PHYSICAL THERAPY FEMALE PELVIC EVALUATION   Patient Name: Terry Edwards MRN: [id number] DOB:11/01/1935, 87 y.o., female Today's Date: 02/08/2023  END OF SESSION:  PT End of Session - 02/08/23 0935     Visit Number 2    Date for PT Re-Evaluation 05/04/23    Authorization Type aetna medicare    Progress Note Due on Visit 10    PT Start Time 0932    PT Stop Time 1013    PT Time Calculation (min) 41 min    Activity Tolerance Patient tolerated treatment well    Behavior During Therapy WFL for tasks assessed/performed              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   Cataract    removed bilateral eyes   Chronic hepatitis B without hepatic coma (HCC) 1970s   chronic elev AFP   Cyst of pineal gland    incidental on MRI   Heart murmur    Hemorrhoids    History of chicken pox    History of depression 2006   situational, on Lexapro for 1 year   Hyperlipidemia    Osteopenia    hx fx 5th MT R foot 03/2013   Plantar fasciitis of left foot    Per [facility] new patient packet   Right rotator cuff tendonitis 2013   resolved with PT (injections not helpful)   Urine incontinence    Vitamin D deficiency    Past Surgical History:  Procedure Laterality Date   APPENDECTOMY  1957   BLEPHAROPLASTY     Per [facility] new patient packet   COLONOSCOPY     EYE SURGERY Bilateral 03/09/15, 03/23/15   cataract removal   TONSILLECTOMY  1940's   Patient Active Problem List   Diagnosis Date Noted   Thoracic aortic aneurysm (HCC) 07/27/2022   Elevated blood pressure reading without diagnosis of hypertension 11/13/2021   Right hand pain 11/13/2021   AF (amaurosis fugax) 07/09/2021   Drug-induced skin rash 04/23/2021   Cyst of joint of hand, right 01/26/2021   Thinning hair 10/12/2020   Murmur, cardiac 10/12/2020   Traumatic incomplete tear of right rotator cuff  10/12/2020   Urge incontinence 10/12/2020   Routine general medical examination at a health care facility 01/09/2015   Chronic toe pain, right foot 09/03/2014   Cyst of pineal gland    Vitamin D deficiency    Osteoporosis    Hx of type B viral hepatitis    Hyperlipidemia     PCP: Garth Bigness, MD  REFERRING PROVIDER: Marguerita Beards, MD  REFERRING DIAG: N32.81 (ICD-10-CM) - Overactive bladder  THERAPY DIAG:  Muscle weakness (generalized)  Abnormal posture  Unspecified lack of coordination  Rationale for Evaluation and Treatment: Rehabilitation  ONSET DATE: years  SUBJECTIVE:  SUBJECTIVE STATEMENT: Pt reports she has been able to control leakage, hasn't had any since eval.  Fluid intake: Yes: water - 25oz day, chai tea in the morning    PAIN:  Are you having pain? No  PRECAUTIONS: None  WEIGHT BEARING RESTRICTIONS: No  FALLS:  Has patient fallen in last 6 months? No  LIVING ENVIRONMENT: Lives with: lives alone Lives in: House/apartment   OCCUPATION: retired   PLOF: Independent  PATIENT GOALS: to have less leakage  PERTINENT HISTORY:  squamous cell carcinoma removed from Left leg, Right rotator cuff tendonitis, Plantar fasciitis of left foot, depression,  Osteopenia, Hemorrhoids,  Chronic hepatitis B without hepatic coma      Sexual abuse: No  BOWEL MOVEMENT: Pain with bowel movement: No Type of bowel movement:Type (Bristol Stool Scale) type 2-4, Frequency daily, and Strain Yes to get started  Fully empty rectum: Yes:   Leakage: No Pads: No Fiber supplement: No  URINATION: Pain with urination: No Fully empty bladder: No  -not always Stream: Weak Urgency: Yes:   Frequency: in the am about every 1 hour, sometimes 1x night.  Leakage: Urge to void Pads:  No  INTERCOURSE: Pain with intercourse:  not active   PREGNANCY: Vaginal deliveries 4 Tearing No C-section deliveries 0 Currently pregnant No  PROLAPSE: None   OBJECTIVE:   DIAGNOSTIC FINDINGS:   COGNITION: Overall cognitive status: Within functional limits for tasks assessed     SENSATION: Light touch: Appears intact Proprioception: Appears intact  MUSCLE LENGTH: Bil hamstrings and adductors limited by 25%   POSTURE: rounded shoulders and posterior pelvic tilt  PELVIC ALIGNMENT: WFL  LUMBARAROM/PROM:  A/PROM A/PROM  eval  Flexion Limited by 50%  Extension WFL  Right lateral flexion Limited by 25%  Left lateral flexion Limited by 25%  Right rotation WFL  Left rotation WFL   (Blank rows = not tested)  LOWER EXTREMITY ROM:  WFL  LOWER EXTREMITY MMT:  Bil hips grossly 4/5, knees 5/5  PALPATION:   General  no TTP                 External Perineal Exam no TTP, dryness noted externally                              Internal Pelvic Floor no TTP  Patient confirms identification and approves PT to assess internal pelvic floor and treatment Yes  PELVIC MMT:   MMT eval  Vaginal 2/5 initially improved to 3/5 with cues; 4s; 5 reps  Internal Anal Sphincter   External Anal Sphincter   Puborectalis   Diastasis Recti   (Blank rows = not tested)        TONE: WFL  PROLAPSE: Not seen in hooklying   TODAY'S TREATMENT:  DATE:   02/01/23 EVAL Examination completed, findings reviewed, pt educated on POC, HEP, and urge drill. Pt motivated to participate in PT and agreeable to attempt recommendations.    02/08/23: NMRE: all exercises cued for breathing mechanics and pelvic floor mobility for improved coordination and decreased leakage/strain at pelvic floor.  2x10 bridges (unable to feel kegel on sec set) 2x10 ball squeezes in  hooklying  Opp hand/knee ball press 2x10 Seated hip abduction red loop 2x10 Seated marching red loop 2x10 Sit to stand x10  from mat  Dead lifts 10# x10 X4 steps 10# switching hands ascending/descending  2x5 X5 quick paced stand 20' walk then return to sit for improved coordination with urgency X5 quick 10' with x5 quick flicks then 10' return to sit    PATIENT EDUCATION:  Education details: [id number] Person educated: Patient Education method: Explanation, Demonstration, Tactile cues, Verbal cues, and Handouts Education comprehension: verbalized understanding and returned demonstration  HOME EXERCISE PROGRAM: [id number]  ASSESSMENT:  CLINICAL IMPRESSION: Patient treatment focused on coordination of breathing mechanics with activity and to improve urgency for decreased leakage. Pt tolerated well denied leakage, did need reps and cues for coordination. Also reports she was unable to feel kegels during transitions of exercises however with cues and reps this improved. Pt would benefit from additional PT to further address deficits.       OBJECTIVE IMPAIRMENTS: decreased coordination, decreased endurance, decreased mobility, decreased strength, impaired flexibility, improper body mechanics, and postural dysfunction.   ACTIVITY LIMITATIONS: continence  PARTICIPATION LIMITATIONS: community activity  PERSONAL FACTORS: Time since onset of injury/illness/exacerbation are also affecting patient's functional outcome.   REHAB POTENTIAL: Good  CLINICAL DECISION MAKING: Stable/uncomplicated  EVALUATION COMPLEXITY: Low   GOALS: Goals reviewed with patient? Yes  SHORT TERM GOALS: Target date: 03/01/23  Pt to be I with HEP.  Baseline: Goal status: INITIAL  2.  Pt will have 25% less urgency due to bladder retraining and strengthening  Baseline:  Goal status: INITIAL  3.  Pt to be I with breathing mechanics and pelvic floor coordination for decreased leakage.  Baseline:  Goal status:  INITIAL   LONG TERM GOALS: Target date: 05/04/23  Pt to be I with advanced HEP.  Baseline:  Goal status: INITIAL  2.  Pt will have 50% less urgency due to bladder retraining and strengthening  Baseline:  Goal status: INITIAL  3.  Pt will have 50% reduced leakage during a typical day for improved QOL Baseline:  Goal status: INITIAL  4.  Pt to demonstrate at least 4/5 pelvic floor strength for improved pelvic stability and decreased strain at pelvic floor/ decrease leakage.  Baseline:  Goal status: INITIAL    PLAN:  PT FREQUENCY: 1x/week  PT DURATION:  6 sessions  PLANNED INTERVENTIONS: Therapeutic exercises, Therapeutic activity, Neuromuscular re-education, Balance training, Patient/Family education, Self Care, Joint mobilization, DME instructions, Dry Needling, Spinal mobilization, Cryotherapy, Moist heat, Taping, Biofeedback, and Manual therapy  PLAN FOR NEXT SESSION: review urge drill if needed, pelvic floor strengthening, core/hip strengthening  Otelia Sergeant, PT, DPT 02/07/2409:19 AM

## 2023-02-28 ENCOUNTER — Ambulatory Visit: Payer: Medicare HMO | Admitting: Physical Therapy

## 2023-02-28 DIAGNOSIS — M6281 Muscle weakness (generalized): Secondary | ICD-10-CM

## 2023-02-28 DIAGNOSIS — R293 Abnormal posture: Secondary | ICD-10-CM | POA: Diagnosis not present

## 2023-02-28 DIAGNOSIS — R279 Unspecified lack of coordination: Secondary | ICD-10-CM

## 2023-02-28 NOTE — Therapy (Signed)
OUTPATIENT PHYSICAL THERAPY FEMALE PELVIC EVALUATION   Patient Name: Terry Edwards [place] MRN: [id number] DOB:[birth year], 87 y.o., female Today's Date: 02/28/2023  END OF SESSION:  PT End of Session - 02/28/23 1013     Visit Number 3    Date for PT Re-Evaluation 05/04/23    Authorization Type aetna medicare    Progress Note Due on Visit 10    PT Start Time 0930    PT Stop Time 1008    PT Time Calculation (min) 38 min    Activity Tolerance Patient tolerated treatment well    Behavior During Therapy WFL for tasks assessed/performed               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   Cataract    removed bilateral eyes   Chronic hepatitis B without hepatic coma (HCC) 1970s   chronic elev AFP   Cyst of pineal gland    incidental on MRI   Heart murmur    Hemorrhoids    History of chicken pox    History of depression 2006   situational, on Lexapro for 1 year   Hyperlipidemia    Osteopenia    hx fx 5th MT R foot 03/2013   Plantar fasciitis of left foot    Per [facility] new patient packet   Right rotator cuff tendonitis 2013   resolved with PT (injections not helpful)   Urine incontinence    Vitamin D deficiency    Past Surgical History:  Procedure Laterality Date   APPENDECTOMY  1957   BLEPHAROPLASTY     Per [facility] new patient packet   COLONOSCOPY     EYE SURGERY Bilateral 03/09/15, 03/23/15   cataract removal   TONSILLECTOMY  1940's   Patient Active Problem List   Diagnosis Date Noted   Thoracic aortic aneurysm (HCC) 07/27/2022   Elevated blood pressure reading without diagnosis of hypertension 11/13/2021   Right hand pain 11/13/2021   AF (amaurosis fugax) 07/09/2021   Drug-induced skin rash 04/23/2021   Cyst of joint of hand, right 01/26/2021   Thinning hair 10/12/2020   Murmur, cardiac 10/12/2020   Traumatic incomplete tear of right rotator cuff  10/12/2020   Urge incontinence 10/12/2020   Routine general medical examination at a health care facility 01/09/2015   Chronic toe pain, right foot 09/03/2014   Cyst of pineal gland    Vitamin D deficiency    Osteoporosis    Hx of type B viral hepatitis    Hyperlipidemia     PCP: Garth Bigness, MD  REFERRING PROVIDER: Marguerita Beards, MD  REFERRING DIAG: N32.81 (ICD-10-CM) - Overactive bladder  THERAPY DIAG:  Muscle weakness (generalized)  Abnormal posture  Unspecified lack of coordination  Rationale for Evaluation and Treatment: Rehabilitation  ONSET DATE: years  SUBJECTIVE:  SUBJECTIVE STATEMENT: Pt reports she has had no leakage and much more able to control all urinary urges.   Fluid intake: Yes: water - 25oz day, chai tea in the morning    PAIN:  Are you having pain? No  PRECAUTIONS: None  WEIGHT BEARING RESTRICTIONS: No  FALLS:  Has patient fallen in last 6 months? No  LIVING ENVIRONMENT: Lives with: lives alone Lives in: House/apartment   OCCUPATION: retired   PLOF: Independent  PATIENT GOALS: to have less leakage  PERTINENT HISTORY:  squamous cell carcinoma removed from Left leg, Right rotator cuff tendonitis, Plantar fasciitis of left foot, depression,  Osteopenia, Hemorrhoids,  Chronic hepatitis B without hepatic coma      Sexual abuse: No  BOWEL MOVEMENT: Pain with bowel movement: No Type of bowel movement:Type (Bristol Stool Scale) type 2-4, Frequency daily, and Strain Yes to get started  Fully empty rectum: Yes:   Leakage: No Pads: No Fiber supplement: No  URINATION: Pain with urination: No Fully empty bladder: No  -not always Stream: Weak Urgency: Yes:   Frequency: in the am about every 1 hour, sometimes 1x night.  Leakage: Urge to  void Pads: No  INTERCOURSE: Pain with intercourse:  not active   PREGNANCY: Vaginal deliveries 4 Tearing No C-section deliveries 0 Currently pregnant No  PROLAPSE: None   OBJECTIVE:   DIAGNOSTIC FINDINGS:   COGNITION: Overall cognitive status: Within functional limits for tasks assessed     SENSATION: Light touch: Appears intact Proprioception: Appears intact  MUSCLE LENGTH: Bil hamstrings and adductors limited by 25%   POSTURE: rounded shoulders and posterior pelvic tilt  PELVIC ALIGNMENT: WFL  LUMBARAROM/PROM:  A/PROM A/PROM  eval  Flexion Limited by 50%  Extension WFL  Right lateral flexion Limited by 25%  Left lateral flexion Limited by 25%  Right rotation WFL  Left rotation WFL   (Blank rows = not tested)  LOWER EXTREMITY ROM:  WFL  LOWER EXTREMITY MMT:  Bil hips grossly 4/5, knees 5/5  PALPATION:   General  no TTP                 External Perineal Exam no TTP, dryness noted externally                              Internal Pelvic Floor no TTP  Patient confirms identification and approves PT to assess internal pelvic floor and treatment Yes  PELVIC MMT:   MMT eval  Vaginal 2/5 initially improved to 3/5 with cues; 4s; 5 reps  Internal Anal Sphincter   External Anal Sphincter   Puborectalis   Diastasis Recti   (Blank rows = not tested)        TONE: WFL  PROLAPSE: Not seen in hooklying   TODAY'S TREATMENT:  DATE:   02/01/23 EVAL Examination completed, findings reviewed, pt educated on POC, HEP, and urge drill. Pt motivated to participate in PT and agreeable to attempt recommendations.    02/08/23: NMRE: all exercises cued for breathing mechanics and pelvic floor mobility for improved coordination and decreased leakage/strain at pelvic floor.  2x10 bridges (unable to feel kegel on sec set) 2x10 ball squeezes  in hooklying  Opp hand/knee ball press 2x10 Seated hip abduction red loop 2x10 Seated marching red loop 2x10 Sit to stand x10  from mat  Dead lifts 10# x10 X4 steps 10# switching hands ascending/descending  2x5 X5 quick paced stand 20' walk then return to sit for improved coordination with urgency X5 quick 10' with x5 quick flicks then 10' return to sit  02/28/2023  NMRE: all exercises cued for breathing mechanics and pelvic floor mobility for improved coordination and decreased leakage/strain at pelvic floor.  2x10 ball squeezes in hooklying  Opp hand/knee ball press 2x10 hip abduction 2x10 25# marching 2x10 25# Sit to stand x10  from mat 10# Dead lifts 10# x10 Bike 5 mins L5  Farmers carry 10# 500'    PATIENT EDUCATION:  Education details: [id number] Person educated: Patient Education method: Explanation, Demonstration, Tactile cues, Verbal cues, and Handouts Education comprehension: verbalized understanding and returned demonstration  HOME EXERCISE PROGRAM: [id number]  ASSESSMENT:  CLINICAL IMPRESSION: Patient treatment focused on coordination of breathing mechanics with activity and to improve urgency for decreased leakage. Pt tolerated well denied leakage, reports she is feeling much better. Feels more able to complete pelvic floor contractions with all mobility and looking at more community options for working out. Discussed active adult centers locally.  Pt has met all goals except one LTG as she deferred internal re check as her symptoms are better. Pt agreeable to DC today and educated if she has any future needs for PT she would need a new referral.   OBJECTIVE IMPAIRMENTS: decreased coordination, decreased endurance, decreased mobility, decreased strength, impaired flexibility, improper body mechanics, and postural dysfunction.   ACTIVITY LIMITATIONS: continence  PARTICIPATION LIMITATIONS: community activity  PERSONAL FACTORS: Time since onset of  injury/illness/exacerbation are also affecting patient's functional outcome.   REHAB POTENTIAL: Good  CLINICAL DECISION MAKING: Stable/uncomplicated  EVALUATION COMPLEXITY: Low   GOALS: Goals reviewed with patient? Yes  SHORT TERM GOALS: Target date: 03/01/23  Pt to be I with HEP.  Baseline: Goal status: MET  2.  Pt will have 25% less urgency due to bladder retraining and strengthening  Baseline:  Goal status: MET  3.  Pt to be I with breathing mechanics and pelvic floor coordination for decreased leakage.  Baseline:  Goal status: MET   LONG TERM GOALS: Target date: 05/04/23  Pt to be I with advanced HEP.  Baseline:  Goal status: MET  2.  Pt will have 50% less urgency due to bladder retraining and strengthening  Baseline:  Goal status: MET  3.  Pt will have 50% reduced leakage during a typical day for improved QOL Baseline:  Goal status: MET  4.  Pt to demonstrate at least 4/5 pelvic floor strength for improved pelvic stability and decreased strain at pelvic floor/ decrease leakage.  Baseline:  Goal status: not re-checked as her symptoms are better.    PLAN:  PT FREQUENCY: 1x/week  PT DURATION:  6 sessions  PLANNED INTERVENTIONS: Therapeutic exercises, Therapeutic activity, Neuromuscular re-education, Balance training, Patient/Family education, Self Care, Joint mobilization, DME instructions, Dry Needling, Spinal mobilization, Cryotherapy, Moist heat, Taping, Biofeedback,  and Manual therapy  PLAN FOR NEXT SESSION:   PHYSICAL THERAPY DISCHARGE SUMMARY  Visits from Start of Care: 3  Current functional level related to goals / functional outcomes: All STG met, LTG 3/4 with one unmet as internal not needed with pt's symptoms resolved   Remaining deficits: None per pt   Education / Equipment: HEP   Patient agrees to discharge. Patient goals were partially met. Patient is being discharged due to being pleased with the current functional level.   Otelia Sergeant, PT, DPT 02/27/2409:15 AM

## 2023-03-14 ENCOUNTER — Encounter: Payer: Medicare HMO | Admitting: Physical Therapy

## 2023-03-21 ENCOUNTER — Encounter: Payer: Medicare HMO | Admitting: Physical Therapy

## 2023-03-22 DIAGNOSIS — L821 Other seborrheic keratosis: Secondary | ICD-10-CM | POA: Diagnosis not present

## 2023-03-22 DIAGNOSIS — D485 Neoplasm of uncertain behavior of skin: Secondary | ICD-10-CM | POA: Diagnosis not present

## 2023-03-22 DIAGNOSIS — L853 Xerosis cutis: Secondary | ICD-10-CM | POA: Diagnosis not present

## 2023-03-22 DIAGNOSIS — Z85828 Personal history of other malignant neoplasm of skin: Secondary | ICD-10-CM | POA: Diagnosis not present

## 2023-03-22 DIAGNOSIS — L309 Dermatitis, unspecified: Secondary | ICD-10-CM | POA: Diagnosis not present

## 2023-03-22 DIAGNOSIS — L570 Actinic keratosis: Secondary | ICD-10-CM | POA: Diagnosis not present

## 2023-03-22 DIAGNOSIS — D1801 Hemangioma of skin and subcutaneous tissue: Secondary | ICD-10-CM | POA: Diagnosis not present

## 2023-03-28 ENCOUNTER — Encounter: Payer: Medicare HMO | Admitting: Physical Therapy

## 2023-03-30 ENCOUNTER — Encounter: Payer: Self-pay | Admitting: Obstetrics and Gynecology

## 2023-03-30 ENCOUNTER — Ambulatory Visit: Payer: Medicare HMO | Admitting: Obstetrics and Gynecology

## 2023-03-30 VITALS — BP 149/77 | HR 69

## 2023-03-30 DIAGNOSIS — N3941 Urge incontinence: Secondary | ICD-10-CM

## 2023-03-30 DIAGNOSIS — R030 Elevated blood-pressure reading, without diagnosis of hypertension: Secondary | ICD-10-CM

## 2023-03-30 DIAGNOSIS — N3281 Overactive bladder: Secondary | ICD-10-CM

## 2023-03-30 NOTE — Patient Instructions (Addendum)
Consider adding in pumpkin seed extract up to 5gm daily has been shown to be effective, most of my patient's take 3-4gm daily (3000mg -4000mg ).   Try to drink your fluids intentionally. Drinking 8-10oz in a shorter span versus sipping may be helpful in controlling your frequency as your kidney's will process the fluid at one time rather than in small spurts which creates concentrated urine.   Acidic drinks (Black tea and coffee) can still be irritative due to the acidity. Low acid/herbal options are better.   Will schedule the PTNS 2-12 after today.

## 2023-03-30 NOTE — Progress Notes (Signed)
[facility] Return Visit  SUBJECTIVE  History of Present Illness: Terry Edwards is a 87 y.o. female seen in follow-up for OAB. Plan at last visit was start pelvic floor PT and consider PTNS.   She reports she had 3 meetings of PT and was discharged.    Past Medical History: Patient  has a past medical history of Allergy, Arthritis, Cancer (HCC), Cataract, Chronic hepatitis B without hepatic coma (HCC) (1970s), Cyst of pineal gland, Heart murmur, Hemorrhoids, History of chicken pox, History of depression (2006), Hyperlipidemia, Osteopenia, Plantar fasciitis of left foot, Right rotator cuff tendonitis (2013), Urine incontinence, and Vitamin D deficiency.   Past Surgical History: She  has a past surgical history that includes Appendectomy (1610); Tonsillectomy (1940's); Eye surgery (Bilateral, 03/09/15, 03/23/15); Blepharoplasty; and Colonoscopy.   Medications: She has a current medication list which includes the following prescription(s): vitamin c, biotin, calcium-magnesium, vitamin d3, fish oil, and turmeric.   Allergies: Patient is allergic to aricept [donepezil] and fosamax [alendronate].   Social History: Patient  reports that she has never smoked. She has never used smokeless tobacco. She reports that she does not currently use alcohol. She reports that she does not use drugs.      OBJECTIVE     Physical Exam: Vitals:   03/30/23 1004 03/30/23 1108  BP: (!) 156/70 (!) 149/77  Pulse: 79 69   Gen: No apparent distress, A&O x 3.  Detailed Urogynecologic Evaluation:  Deferred.    ASSESSMENT AND PLAN    Ms. Terry Edwards is a 87 y.o. with:  1. Urge incontinence [N39.41]   2. Overactive bladder   3. Elevated blood pressure reading in office without diagnosis of hypertension    Patient completed PT and is still having OAB symptoms. She would like to start PTNS.  Discussed she could also consider doing pumpkin seed extract up to 5gm daily to assist in symptoms as she  is not currently interested in formal medication.  Patient's BP was elevated x2 in the office today.

## 2023-03-30 NOTE — Progress Notes (Signed)
[facility]  PTNS VISIT  CC:  Overactive bladder  87 y.o. with refractory overactive bladder who presents for percutaneous tibial nerve stimulation. The patient presents for PTNS session # 1.   Procedure: The patient was placed in the sitting position and the left lower extremity was prepped in the usual fashion. The PTNS needle was then inserted at a 60 degree angle, 5 cm cephalad and 2 cm posterior to the medial malleolus. The PTNS unit was then programmed and an optimal response was noted at 5 milliamps. The PTNS stimulation was then performed at this setting for 30 minutes without incident and the patient tolerated the procedure well. The needle was removed and hemostasis was noted.   The pt will return in 1 week for PTNS session # 2. All questions were answered.

## 2023-04-04 ENCOUNTER — Encounter: Payer: Medicare HMO | Admitting: Physical Therapy

## 2023-04-07 ENCOUNTER — Encounter: Payer: Self-pay | Admitting: Obstetrics and Gynecology

## 2023-04-07 ENCOUNTER — Ambulatory Visit: Payer: Medicare HMO | Admitting: Obstetrics and Gynecology

## 2023-04-07 VITALS — BP 138/79 | HR 75

## 2023-04-07 DIAGNOSIS — R3915 Urgency of urination: Secondary | ICD-10-CM

## 2023-04-07 NOTE — Progress Notes (Signed)
 [facility]  PTNS VISIT  CC:  Overactive bladder  87 y.o. with refractory overactive bladder who presents for percutaneous tibial nerve stimulation. The patient presents for PTNS session # 2.   Procedure: The patient was placed in the sitting position and the right lower extremity was prepped in the usual fashion. The PTNS needle was then inserted at a 60 degree angle, 5 cm cephalad and 2 cm posterior to the medial malleolus. The PTNS unit was then programmed and an optimal response was noted at 5 milliamps. The PTNS stimulation was then performed at this setting for 30 minutes without incident and the patient tolerated the procedure well. The needle was removed and hemostasis was noted.   The pt will return in 1 week for PTNS session # 3. All questions were answered.

## 2023-04-10 ENCOUNTER — Other Ambulatory Visit: Payer: Self-pay | Admitting: Family Medicine

## 2023-04-10 DIAGNOSIS — Z1231 Encounter for screening mammogram for malignant neoplasm of breast: Secondary | ICD-10-CM

## 2023-04-11 ENCOUNTER — Other Ambulatory Visit: Payer: Self-pay | Admitting: Family Medicine

## 2023-04-11 ENCOUNTER — Encounter: Payer: Medicare HMO | Admitting: Physical Therapy

## 2023-04-11 ENCOUNTER — Ambulatory Visit
Admission: RE | Admit: 2023-04-11 | Discharge: 2023-04-11 | Disposition: A | Discharge status: Home / Self Care | Payer: Medicare HMO | Source: Ambulatory Visit | Attending: Family Medicine | Admitting: Family Medicine

## 2023-04-11 DIAGNOSIS — Z1231 Encounter for screening mammogram for malignant neoplasm of breast: Secondary | ICD-10-CM

## 2023-04-11 DIAGNOSIS — N6310 Unspecified lump in the right breast, unspecified quadrant: Secondary | ICD-10-CM

## 2023-04-14 ENCOUNTER — Ambulatory Visit: Payer: Medicare HMO | Admitting: Obstetrics and Gynecology

## 2023-04-14 ENCOUNTER — Encounter: Payer: Self-pay | Admitting: Obstetrics and Gynecology

## 2023-04-14 VITALS — BP 127/76 | HR 79

## 2023-04-14 DIAGNOSIS — N3941 Urge incontinence: Secondary | ICD-10-CM

## 2023-04-14 NOTE — Progress Notes (Signed)
[facility]  PTNS VISIT  CC:  Overactive bladder  87 y.o. with refractory overactive bladder who presents for percutaneous tibial nerve stimulation. The patient presents for PTNS session # 3.   Procedure: The patient was placed in the sitting position and the left lower extremity was prepped in the usual fashion. The PTNS needle was then inserted at a 60 degree angle, 5 cm cephalad and 2 cm posterior to the medial malleolus. The PTNS unit was then programmed and an optimal response was noted at 6 milliamps. The PTNS stimulation was then performed at this setting for 30 minutes without incident and the patient tolerated the procedure well. The needle was removed and hemostasis was noted.   The pt will return in 1 week for PTNS session # 4. All questions were answered.

## 2023-04-17 ENCOUNTER — Encounter: Payer: Self-pay | Admitting: Family Medicine

## 2023-04-18 ENCOUNTER — Encounter: Payer: Medicare HMO | Admitting: Physical Therapy

## 2023-04-21 ENCOUNTER — Ambulatory Visit
Admission: RE | Admit: 2023-04-21 | Discharge: 2023-04-21 | Disposition: A | Discharge status: Home / Self Care | Payer: Medicare HMO | Source: Ambulatory Visit | Attending: Family Medicine | Admitting: Family Medicine

## 2023-04-21 ENCOUNTER — Ambulatory Visit (INDEPENDENT_AMBULATORY_CARE_PROVIDER_SITE_OTHER): Payer: Medicare HMO | Admitting: Obstetrics and Gynecology

## 2023-04-21 ENCOUNTER — Encounter: Payer: Self-pay | Admitting: Obstetrics and Gynecology

## 2023-04-21 VITALS — BP 157/75 | HR 79

## 2023-04-21 DIAGNOSIS — N6310 Unspecified lump in the right breast, unspecified quadrant: Secondary | ICD-10-CM

## 2023-04-21 DIAGNOSIS — N6459 Other signs and symptoms in breast: Secondary | ICD-10-CM | POA: Diagnosis not present

## 2023-04-21 DIAGNOSIS — N3941 Urge incontinence: Secondary | ICD-10-CM | POA: Diagnosis not present

## 2023-04-21 DIAGNOSIS — N644 Mastodynia: Secondary | ICD-10-CM | POA: Diagnosis not present

## 2023-04-21 DIAGNOSIS — R3915 Urgency of urination: Secondary | ICD-10-CM

## 2023-04-21 DIAGNOSIS — N3281 Overactive bladder: Secondary | ICD-10-CM

## 2023-04-21 NOTE — Progress Notes (Signed)
[facility]  PTNS VISIT  CC:  Overactive bladder  86 y.o. with refractory overactive bladder who presents for percutaneous tibial nerve stimulation. The patient presents for PTNS session # 4.   Procedure: The patient was placed in the sitting position and the right lower extremity was prepped in the usual fashion. The PTNS needle was then inserted at a 60 degree angle, 5 cm cephalad and 2 cm posterior to the medial malleolus. The PTNS unit was then programmed and an optimal response was noted at 6 milliamps. The PTNS stimulation was then performed at this setting for 30 minutes without incident and the patient tolerated the procedure well. The needle was removed and hemostasis was noted.   The pt will return in 1 week for PTNS session # 5. All questions were answered.

## 2023-04-27 ENCOUNTER — Ambulatory Visit: Payer: Medicare HMO | Admitting: Obstetrics and Gynecology

## 2023-04-27 ENCOUNTER — Encounter: Payer: Self-pay | Admitting: Obstetrics and Gynecology

## 2023-04-27 VITALS — BP 129/69 | HR 75

## 2023-04-27 DIAGNOSIS — N3941 Urge incontinence: Secondary | ICD-10-CM | POA: Diagnosis not present

## 2023-04-27 DIAGNOSIS — N3281 Overactive bladder: Secondary | ICD-10-CM | POA: Diagnosis not present

## 2023-04-27 DIAGNOSIS — R3915 Urgency of urination: Secondary | ICD-10-CM

## 2023-04-27 NOTE — Progress Notes (Signed)
[facility]  PTNS VISIT  CC:  Overactive bladder  87 y.o. with refractory overactive bladder who presents for percutaneous tibial nerve stimulation. The patient presents for PTNS session # 5.   Procedure: The patient was placed in the sitting position and the left lower extremity was prepped in the usual fashion. The PTNS needle was then inserted at a 60 degree angle, 5 cm cephalad and 2 cm posterior to the medial malleolus. The PTNS unit was then programmed and an optimal response was noted at 5 milliamps. The PTNS stimulation was then performed at this setting for 30 minutes without incident and the patient tolerated the procedure well. The needle was removed and hemostasis was noted.   The pt will return in 1 week for PTNS session # 6. All questions were answered.

## 2023-04-27 NOTE — Patient Instructions (Signed)
It is always good to see you. Please be safe in the rain.

## 2023-05-04 ENCOUNTER — Ambulatory Visit: Payer: Medicare HMO | Admitting: Obstetrics and Gynecology

## 2023-05-04 ENCOUNTER — Encounter: Payer: Self-pay | Admitting: Obstetrics and Gynecology

## 2023-05-04 VITALS — BP 111/69 | HR 80

## 2023-05-04 DIAGNOSIS — N3281 Overactive bladder: Secondary | ICD-10-CM

## 2023-05-04 DIAGNOSIS — N3941 Urge incontinence: Secondary | ICD-10-CM | POA: Diagnosis not present

## 2023-05-04 DIAGNOSIS — R3915 Urgency of urination: Secondary | ICD-10-CM

## 2023-05-04 NOTE — Progress Notes (Signed)
[facility]  PTNS VISIT  CC:  Overactive bladder  87 y.o. with refractory overactive bladder who presents for percutaneous tibial nerve stimulation. The patient presents for PTNS session # 6.   Procedure: The patient was placed in the sitting position and the right lower extremity was prepped in the usual fashion. The PTNS needle was then inserted at a 60 degree angle, 5 cm cephalad and 2 cm posterior to the medial malleolus. The PTNS unit was then programmed and an optimal response was noted at 5 milliamps. The PTNS stimulation was then performed at this setting for 30 minutes without incident and the patient tolerated the procedure well. The needle was removed and hemostasis was noted.   The pt will return in 1 week for PTNS session # 7. All questions were answered.

## 2023-05-11 ENCOUNTER — Ambulatory Visit: Payer: Medicare HMO | Admitting: Obstetrics and Gynecology

## 2023-05-18 ENCOUNTER — Encounter: Payer: Self-pay | Admitting: Obstetrics and Gynecology

## 2023-05-18 ENCOUNTER — Ambulatory Visit: Payer: Medicare HMO | Admitting: Obstetrics and Gynecology

## 2023-05-18 VITALS — BP 134/70 | HR 76

## 2023-05-18 DIAGNOSIS — N3941 Urge incontinence: Secondary | ICD-10-CM

## 2023-05-18 DIAGNOSIS — R350 Frequency of micturition: Secondary | ICD-10-CM | POA: Diagnosis not present

## 2023-05-18 DIAGNOSIS — N3281 Overactive bladder: Secondary | ICD-10-CM

## 2023-05-18 DIAGNOSIS — R3915 Urgency of urination: Secondary | ICD-10-CM

## 2023-05-18 NOTE — Progress Notes (Signed)
[facility]  PTNS VISIT  CC:  Overactive bladder  87 y.o. with refractory overactive bladder who presents for percutaneous tibial nerve stimulation. The patient presents for PTNS session # 7.   Procedure: The patient was placed in the sitting position and the left lower extremity was prepped in the usual fashion. The PTNS needle was then inserted at a 60 degree angle, 5 cm cephalad and 2 cm posterior to the medial malleolus. The PTNS unit was then programmed and an optimal response was noted at 6 milliamps. The PTNS stimulation was then performed at this setting for 30 minutes without incident and the patient tolerated the procedure well. The needle was removed and hemostasis was noted.   The pt will return in 1 week for PTNS session # 8. All questions were answered.

## 2023-05-25 ENCOUNTER — Ambulatory Visit: Payer: Medicare HMO | Admitting: Obstetrics and Gynecology

## 2023-05-25 VITALS — BP 137/78 | HR 73

## 2023-05-25 DIAGNOSIS — N3941 Urge incontinence: Secondary | ICD-10-CM | POA: Diagnosis not present

## 2023-05-25 DIAGNOSIS — R3915 Urgency of urination: Secondary | ICD-10-CM

## 2023-05-25 NOTE — Progress Notes (Signed)
[facility]  PTNS VISIT  CC:  Overactive bladder  87 y.o. with refractory overactive bladder who presents for percutaneous tibial nerve stimulation. The patient presents for PTNS session # 8.   Procedure: The patient was placed in the sitting position and the right lower extremity was prepped in the usual fashion. The PTNS needle was then inserted at a 60 degree angle, 5 cm cephalad and 2 cm posterior to the medial malleolus. The PTNS unit was then programmed and an optimal response was noted at 8 milliamps. The PTNS stimulation was then performed at this setting for 30 minutes without incident and the patient tolerated the procedure well. The needle was removed and hemostasis was noted.   The pt will return in 1 week for PTNS session # 9. All questions were answered.

## 2023-05-29 DIAGNOSIS — Z79899 Other long term (current) drug therapy: Secondary | ICD-10-CM | POA: Diagnosis not present

## 2023-05-29 DIAGNOSIS — E7800 Pure hypercholesterolemia, unspecified: Secondary | ICD-10-CM | POA: Diagnosis not present

## 2023-05-30 ENCOUNTER — Telehealth: Payer: Self-pay

## 2023-05-30 NOTE — Telephone Encounter (Signed)
[employer] contacted NO PA REQUIRED FOR PTNS PER MEDICARE GUIDELINES CPT 64566 DX CODE- N39.41

## 2023-05-31 ENCOUNTER — Ambulatory Visit: Payer: Medicare HMO | Admitting: Obstetrics and Gynecology

## 2023-05-31 ENCOUNTER — Encounter: Payer: Self-pay | Admitting: Obstetrics and Gynecology

## 2023-05-31 VITALS — BP 130/82 | HR 71

## 2023-05-31 DIAGNOSIS — N3941 Urge incontinence: Secondary | ICD-10-CM | POA: Diagnosis not present

## 2023-05-31 DIAGNOSIS — R3915 Urgency of urination: Secondary | ICD-10-CM

## 2023-05-31 NOTE — Progress Notes (Signed)
[facility]  PTNS VISIT  CC:  Overactive bladder  87 y.o. with refractory overactive bladder who presents for percutaneous tibial nerve stimulation. The patient presents for PTNS session # 9.   Procedure: The patient was placed in the sitting position and the left lower extremity was prepped in the usual fashion. The PTNS needle was then inserted at a 60 degree angle, 5 cm cephalad and 2 cm posterior to the medial malleolus. The PTNS unit was then programmed and an optimal response was noted at 7 milliamps. The PTNS stimulation was then performed at this setting for 30 minutes without incident and the patient tolerated the procedure well. The needle was removed and hemostasis was noted.   The pt will return in 1 week for PTNS session # 10. All questions were answered.

## 2023-06-01 ENCOUNTER — Ambulatory Visit: Payer: Medicare HMO | Admitting: Obstetrics and Gynecology

## 2023-06-02 DIAGNOSIS — R202 Paresthesia of skin: Secondary | ICD-10-CM | POA: Diagnosis not present

## 2023-06-02 DIAGNOSIS — E7800 Pure hypercholesterolemia, unspecified: Secondary | ICD-10-CM | POA: Diagnosis not present

## 2023-06-02 DIAGNOSIS — I700 Atherosclerosis of aorta: Secondary | ICD-10-CM | POA: Diagnosis not present

## 2023-06-02 DIAGNOSIS — Z79899 Other long term (current) drug therapy: Secondary | ICD-10-CM | POA: Diagnosis not present

## 2023-06-02 DIAGNOSIS — Z0000 Encounter for general adult medical examination without abnormal findings: Secondary | ICD-10-CM | POA: Diagnosis not present

## 2023-06-02 DIAGNOSIS — N3946 Mixed incontinence: Secondary | ICD-10-CM | POA: Diagnosis not present

## 2023-06-02 DIAGNOSIS — M810 Age-related osteoporosis without current pathological fracture: Secondary | ICD-10-CM | POA: Diagnosis not present

## 2023-06-09 ENCOUNTER — Ambulatory Visit: Payer: Medicare HMO | Admitting: Obstetrics and Gynecology

## 2023-06-09 ENCOUNTER — Encounter: Payer: Self-pay | Admitting: Obstetrics and Gynecology

## 2023-06-09 VITALS — BP 130/72 | HR 73

## 2023-06-09 DIAGNOSIS — N3941 Urge incontinence: Secondary | ICD-10-CM | POA: Diagnosis not present

## 2023-06-09 DIAGNOSIS — R3915 Urgency of urination: Secondary | ICD-10-CM

## 2023-06-09 NOTE — Progress Notes (Signed)
[facility]  PTNS VISIT  CC:  Overactive bladder  87 y.o. with refractory overactive bladder who presents for percutaneous tibial nerve stimulation. The patient presents for PTNS session # 10.   Procedure: The patient was placed in the sitting position and the left lower extremity was prepped in the usual fashion. The PTNS needle was then inserted at a 60 degree angle, 5 cm cephalad and 2 cm posterior to the medial malleolus. The PTNS unit was then programmed and an optimal response was noted at 5 milliamps. The PTNS stimulation was then performed at this setting for 30 minutes without incident and the patient tolerated the procedure well. The needle was removed and hemostasis was noted.   The pt will return in 1 week for PTNS session # 11. All questions were answered.

## 2023-06-14 DIAGNOSIS — I700 Atherosclerosis of aorta: Secondary | ICD-10-CM | POA: Insufficient documentation

## 2023-06-15 ENCOUNTER — Encounter: Payer: Self-pay | Admitting: Obstetrics and Gynecology

## 2023-06-15 ENCOUNTER — Ambulatory Visit: Payer: Medicare HMO | Admitting: Obstetrics and Gynecology

## 2023-06-15 VITALS — BP 120/71 | HR 69

## 2023-06-15 DIAGNOSIS — R350 Frequency of micturition: Secondary | ICD-10-CM

## 2023-06-15 DIAGNOSIS — N3941 Urge incontinence: Secondary | ICD-10-CM

## 2023-06-15 DIAGNOSIS — N3281 Overactive bladder: Secondary | ICD-10-CM

## 2023-06-15 NOTE — Progress Notes (Signed)
[facility]  PTNS VISIT  CC:  Overactive bladder  87 y.o. with refractory overactive bladder who presents for percutaneous tibial nerve stimulation. The patient presents for PTNS session # 11.  Overall patient feels she has had a 30% approximate improvement. She still has urgency but has been able to hold it.    Procedure: The patient was placed in the sitting position and the left lower extremity was prepped in the usual fashion. The PTNS needle was then inserted at a 60 degree angle, 5 cm cephalad and 2 cm posterior to the medial malleolus. The PTNS unit was then programmed and an optimal response was noted at 5 milliamps. The PTNS stimulation was then performed at this setting for 30 minutes without incident and the patient tolerated the procedure well. The needle was removed and hemostasis was noted.   The pt will return in 1 week for PTNS session # 12. All questions were answered.

## 2023-06-16 NOTE — Addendum Note (Signed)
Addended by: Selmer Dominion on: 06/16/2023 04:14 PM   Modules accepted: Orders

## 2023-06-20 DIAGNOSIS — H52223 Regular astigmatism, bilateral: Secondary | ICD-10-CM | POA: Diagnosis not present

## 2023-06-22 ENCOUNTER — Encounter: Payer: Self-pay | Admitting: Obstetrics and Gynecology

## 2023-06-22 ENCOUNTER — Ambulatory Visit: Payer: Medicare HMO | Admitting: Obstetrics and Gynecology

## 2023-06-22 VITALS — BP 130/75 | HR 65

## 2023-06-22 DIAGNOSIS — N3281 Overactive bladder: Secondary | ICD-10-CM | POA: Diagnosis not present

## 2023-06-22 DIAGNOSIS — N3941 Urge incontinence: Secondary | ICD-10-CM

## 2023-06-22 NOTE — Progress Notes (Signed)
 [facility]  PTNS VISIT  CC:  Overactive bladder  87 y.o. with refractory overactive bladder who presents for percutaneous tibial nerve stimulation. The patient presents for PTNS session # 12.  Based on her bladder diary, she has had some increased ability to hold her urine and decreased frequency. Overall estimated 40-60% improvement.    Procedure: The patient was placed in the sitting position and the right lower extremity was prepped in the usual fashion. The PTNS needle was then inserted at a 60 degree angle, 5 cm cephalad and 2 cm posterior to the medial malleolus. The PTNS unit was then programmed and an optimal response was noted at 5 milliamps. The PTNS stimulation was then performed at this setting for 30 minutes without incident and the patient tolerated the procedure well. The needle was removed and hemostasis was noted.    Will plan for patient to follow up in 1 month to see how her symptoms are. Will plan for her to have a maintenance session at that time as well with the goal that if it is controlling her symptoms she do a session every 8-12 weeks for maintenance.

## 2023-07-18 DIAGNOSIS — Z23 Encounter for immunization: Secondary | ICD-10-CM | POA: Diagnosis not present

## 2023-07-20 ENCOUNTER — Encounter: Payer: Self-pay | Admitting: Obstetrics and Gynecology

## 2023-07-20 ENCOUNTER — Ambulatory Visit: Payer: Medicare HMO | Admitting: Obstetrics and Gynecology

## 2023-07-20 VITALS — BP 136/64 | HR 82

## 2023-07-20 DIAGNOSIS — N3281 Overactive bladder: Secondary | ICD-10-CM

## 2023-07-20 DIAGNOSIS — N3941 Urge incontinence: Secondary | ICD-10-CM

## 2023-07-20 NOTE — Progress Notes (Signed)
[facility]  PTNS VISIT  CC:  Overactive bladder  87 y.o. with refractory overactive bladder who presents for percutaneous tibial nerve stimulation. The patient presents for PTNS Maintenance session # 1.  Patient reports she goes between 6-8 times daily and is getting up once or not at all at night. Reports her urgency is still well controlled. Overall she is doing well.    Procedure: The patient was placed in the sitting position and the right lower extremity was prepped in the usual fashion. The PTNS needle was then inserted at a 60 degree angle, 5 cm cephalad and 2 cm posterior to the medial malleolus. The PTNS unit was then programmed and an optimal response was noted at 6 milliamps. The PTNS stimulation was then performed at this setting for 30 minutes without incident and the patient tolerated the procedure well. The needle was removed and hemostasis was noted.   The pt will return in 2 months for PTNS maintenance session # 2. All questions were answered.

## 2023-09-12 DIAGNOSIS — Z833 Family history of diabetes mellitus: Secondary | ICD-10-CM | POA: Diagnosis not present

## 2023-09-12 DIAGNOSIS — Z85828 Personal history of other malignant neoplasm of skin: Secondary | ICD-10-CM | POA: Diagnosis not present

## 2023-09-12 DIAGNOSIS — R011 Cardiac murmur, unspecified: Secondary | ICD-10-CM | POA: Diagnosis not present

## 2023-09-12 DIAGNOSIS — M810 Age-related osteoporosis without current pathological fracture: Secondary | ICD-10-CM | POA: Diagnosis not present

## 2023-09-12 DIAGNOSIS — I10 Essential (primary) hypertension: Secondary | ICD-10-CM | POA: Diagnosis not present

## 2023-09-12 DIAGNOSIS — I700 Atherosclerosis of aorta: Secondary | ICD-10-CM | POA: Diagnosis not present

## 2023-09-12 DIAGNOSIS — N3941 Urge incontinence: Secondary | ICD-10-CM | POA: Diagnosis not present

## 2023-09-12 DIAGNOSIS — Z888 Allergy status to other drugs, medicaments and biological substances status: Secondary | ICD-10-CM | POA: Diagnosis not present

## 2023-09-12 DIAGNOSIS — E785 Hyperlipidemia, unspecified: Secondary | ICD-10-CM | POA: Diagnosis not present

## 2023-09-12 DIAGNOSIS — I719 Aortic aneurysm of unspecified site, without rupture: Secondary | ICD-10-CM | POA: Diagnosis not present

## 2023-09-12 DIAGNOSIS — Z8249 Family history of ischemic heart disease and other diseases of the circulatory system: Secondary | ICD-10-CM | POA: Diagnosis not present

## 2023-09-12 DIAGNOSIS — L309 Dermatitis, unspecified: Secondary | ICD-10-CM | POA: Diagnosis not present

## 2023-09-18 ENCOUNTER — Ambulatory Visit: Payer: Medicare HMO | Admitting: Obstetrics and Gynecology

## 2023-09-18 ENCOUNTER — Encounter: Payer: Self-pay | Admitting: Obstetrics and Gynecology

## 2023-09-18 VITALS — BP 142/69 | HR 72

## 2023-09-18 DIAGNOSIS — N3941 Urge incontinence: Secondary | ICD-10-CM | POA: Diagnosis not present

## 2023-09-18 DIAGNOSIS — R350 Frequency of micturition: Secondary | ICD-10-CM

## 2023-09-18 DIAGNOSIS — N3281 Overactive bladder: Secondary | ICD-10-CM

## 2023-09-18 NOTE — Progress Notes (Signed)
[facility] Return Visit  SUBJECTIVE  History of Present Illness: Terry Edwards is a 87 y.o. female seen in follow-up for OAB. Patient has successfully completed 12 weeks of PTNS and felt her symptoms were well managed. We decided to go 8 weeks between her last session and see how her symptoms were managed. Patient reports the first 4 weeks were well controlled and weeks 5 and 6 were also decently well controlled but the last two weeks have seen more urgency/frequency.     Past Medical History: Patient  has a past medical history of Allergy, Arthritis, Cancer (HCC), Cataract, Chronic hepatitis B without hepatic coma (HCC) (1970s), Cyst of pineal gland, Heart murmur, Hemorrhoids, History of chicken pox, History of depression (2006), Hyperlipidemia, Osteopenia, Plantar fasciitis of left foot, Right rotator cuff tendonitis (2013), Urine incontinence, and Vitamin D deficiency.   Past Surgical History: She  has a past surgical history that includes Appendectomy (8657); Tonsillectomy (1940's); Eye surgery (Bilateral, 03/09/15, 03/23/15); Blepharoplasty; and Colonoscopy.   Medications: She has a current medication list which includes the following prescription(s): vitamin c, biotin, calcium-magnesium, vitamin d3, fish oil, and turmeric.   Allergies: Patient is allergic to aricept [donepezil] and fosamax [alendronate].   Social History: Patient  reports that she has never smoked. She has never used smokeless tobacco. She reports that she does not currently use alcohol. She reports that she does not use drugs.     OBJECTIVE   Procedure: The patient was placed in the sitting position and the right lower extremity was prepped in the usual fashion. The PTNS needle was then inserted at a 60 degree angle, 5 cm cephalad and 2 cm posterior to the medial malleolus. The PTNS unit was then programmed and an optimal response was noted at 9 milliamps. The PTNS stimulation was then performed at  this setting for 30 minutes without incident and the patient tolerated the procedure well. The needle was removed and hemostasis was noted.       ASSESSMENT AND PLAN    Ms. Terry Edwards is a 87 y.o. with:  1. Urge incontinence [N39.41]   2. Overactive bladder   3. Urinary frequency    We discussed information about the Vivally device which could be used at home since PTNS has shown to be effective for her. It would depend on insurance coverage and if she felt comfortable in using the device at home. Patient reports she will consider this.  Will plan for patient to go 6 weeks after this session and see how well she tolerates the 6 week timeframe. If this is acceptable we can plan for her to come every 6 weeks for PTNS maintenance.    The pt will return in 6 weeks for PTNS maintenance session #2 All questions were answered.   Selmer Dominion, NP

## 2023-10-30 ENCOUNTER — Encounter: Payer: Self-pay | Admitting: Obstetrics and Gynecology

## 2023-10-30 ENCOUNTER — Ambulatory Visit (INDEPENDENT_AMBULATORY_CARE_PROVIDER_SITE_OTHER): Payer: Medicare HMO | Admitting: Obstetrics and Gynecology

## 2023-10-30 VITALS — BP 151/75 | HR 81

## 2023-10-30 DIAGNOSIS — N3941 Urge incontinence: Secondary | ICD-10-CM | POA: Diagnosis not present

## 2023-10-30 DIAGNOSIS — N3281 Overactive bladder: Secondary | ICD-10-CM | POA: Diagnosis not present

## 2023-10-30 DIAGNOSIS — R350 Frequency of micturition: Secondary | ICD-10-CM

## 2023-10-30 NOTE — Progress Notes (Signed)
[facility]  PTNS VISIT  CC:  Overactive bladder  88 y.o. with refractory overactive bladder who presents for percutaneous tibial nerve stimulation. The patient presents for PTNS maintenance session #2.   Procedure: The patient was placed in the sitting position and the left lower extremity was prepped in the usual fashion. The PTNS needle was then inserted at a 60 degree angle, 5 cm cephalad and 2 cm posterior to the medial malleolus. The PTNS unit was then programmed and an optimal response was noted at 7 milliamps. The PTNS stimulation was then performed at this setting for 30 minutes without incident and the patient tolerated the procedure well. The needle was removed and hemostasis was noted.   The pt will return in 7 weeks for PTNS maintenance session #3 All questions were answered.

## 2023-11-01 ENCOUNTER — Ambulatory Visit (INDEPENDENT_AMBULATORY_CARE_PROVIDER_SITE_OTHER): Payer: Medicare HMO | Admitting: Sports Medicine

## 2023-11-01 ENCOUNTER — Encounter: Payer: Self-pay | Admitting: Sports Medicine

## 2023-11-01 VITALS — BP 126/90 | HR 85 | Temp 96.9°F | Resp 16 | Ht 60.6 in | Wt 126.6 lb

## 2023-11-01 DIAGNOSIS — M545 Low back pain, unspecified: Secondary | ICD-10-CM | POA: Diagnosis not present

## 2023-11-01 DIAGNOSIS — E782 Mixed hyperlipidemia: Secondary | ICD-10-CM

## 2023-11-01 DIAGNOSIS — M810 Age-related osteoporosis without current pathological fracture: Secondary | ICD-10-CM

## 2023-11-01 DIAGNOSIS — R5383 Other fatigue: Secondary | ICD-10-CM

## 2023-11-01 DIAGNOSIS — Z131 Encounter for screening for diabetes mellitus: Secondary | ICD-10-CM

## 2023-11-01 NOTE — Progress Notes (Signed)
Careteam: Patient Care Team: Venita Sheffield, MD as PCP - General (Internal Medicine)  PLACE OF SERVICE:  [facility]  Advanced Directive information Does Patient Have a Medical Advance Directive?: Yes, Type of Advance Directive: Healthcare Power of [place];Living will;Out of facility DNR (pink MOST or yellow form), Does patient want to make changes to medical advance directive?: No - Patient declined  Allergies  Allergen Reactions   Aricept [Donepezil] Rash   Fosamax [Alendronate] Rash    Chief Complaint  Patient presents with   Establish Care    New patient.       Discussed the use of AI scribe software for clinical note transcription with the patient, who gave verbal consent to proceed.  History of Present Illness   The patient presents to establish care.   Low back pain She has been experiencing sciatic pain for the past three months, located in the right lower hip with pain radiating down to back of her Rt thigh.  The pain is described as achy and intensifies as the day progresses, rated as 5 out of 10.   No recent trauma has occurred, and she avoids prescription medications, not taking medication often for the pain. She has not tried physical therapy or exercises specifically for this issue. pt denies fecal incontinence, weakness, numbness.   Urinary incontinence She is currently following up with urogynecology treatment, which she finds effective. She experiences urgency but denies any urinary leakage even when coughing or sneezing. Denies dysuria.  She has a history of thoracic aortic aneurysm, for which she has seen a cardiologist.  Ascending Aorta: evidence of mild ascending aortic dilation, 40 mm, on non-contrasted study. Aortic atherosclerosis. Aortic Valve Calcium score: 78  As per cardiology note   ''Thoracic aortic aneurysm [facility]) Recent coronary calcium score was 0 with incidentally noted 40 mm a sending thoracic aorta probably within normal limits.   No follow-up required given patient's age'' No chest pain and reports only mild shortness of breath when walking uphill. She walks for exercise at least twice a week, covering three to five miles each time, without experiencing chest pain, dizziness, or palpitations.  She was diagnosed with osteoporosis two years ago. She previously took Fosamax but developed a rash and itching, leading her to discontinue the medication. She is cautious about taking other medications and focuses on weight-bearing exercises and taking calcium and vitamin D supplements.  She experiences seasonal allergies with nasal congestion and postnasal drip but denies sinus or ear pain. She rarely experiences heartburn or acid reflux. She lives alone and is able to manage her daily activities independently.       Review of Systems:  Review of Systems  Constitutional:  Negative for chills and fever.  HENT:  Negative for congestion and sore throat.   Eyes:  Negative for double vision.  Respiratory:  Negative for cough, sputum production and shortness of breath.   Cardiovascular:  Negative for chest pain, palpitations and leg swelling.  Gastrointestinal:  Negative for abdominal pain, heartburn and nausea.  Genitourinary:  Negative for dysuria, frequency and hematuria.  Musculoskeletal:  Positive for back pain. Negative for falls and myalgias.  Neurological:  Negative for dizziness, sensory change and focal weakness.   Negative unless indicated in HPI.   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   Cataract  removed bilateral eyes   Chronic hepatitis B without hepatic coma (HCC) 1970s   chronic elev AFP   Cyst of pineal gland    incidental on MRI   Heart murmur    Hemorrhoids    History of chicken pox    History of depression 2006   situational, on Lexapro for 1 year   Hyperlipidemia    Osteopenia     hx fx 5th MT R foot 03/2013   Plantar fasciitis of left foot    Per [facility] new patient packet   Right rotator cuff tendonitis 2013   resolved with PT (injections not helpful)   Urine incontinence    Vitamin D deficiency    Past Surgical History:  Procedure Laterality Date   APPENDECTOMY  1957   BLEPHAROPLASTY     Per [facility] new patient packet   COLONOSCOPY     EYE SURGERY Bilateral 03/09/15, 03/23/15   cataract removal   TONSILLECTOMY  1940's   Social History:   reports that she has never smoked. She has never used smokeless tobacco. She reports that she does not currently use alcohol. She reports that she does not use drugs.  Family History  Problem Relation Age of Onset   Hypertension Mother    Hyperlipidemia Mother    Myelodysplastic syndrome Mother    Hypertension Father    Hyperlipidemia Father    Diabetes Father    Suicidality Brother    Pneumonia Son    Parkinson's disease Son    Breast cancer Neg Hx    Colon cancer Neg Hx    Esophageal cancer Neg Hx    Rectal cancer Neg Hx    Stomach cancer Neg Hx     Medications: Patient's Medications  New Prescriptions   No medications on file  Previous Medications   ASCORBIC ACID (VITAMIN C) 1000 MG TABLET    Take 1,000 mg by mouth daily.   BIOTIN 5000 MCG TABS    Take by mouth daily.    COLLAGEN-VITAMIN C-BIOTIN (COLLAGEN PO)    Take 20 g by mouth daily. Vital Proteins Collagen Peptides   MAGNESIUM GLYCINATE PO    Take 400 mg by mouth 3 (three) times daily.   OVER THE COUNTER MEDICATION    Take 2 tablets by mouth 2 (two) times daily. Standard Process (BIOST) Skeletal & Cellular Health   OVER THE COUNTER MEDICATION    Take 1 tablet by mouth in the morning and at bedtime. Standard Process Calcium Metabolism Health (Cal-Ma Plus)   OVER THE COUNTER MEDICATION    Take 2 tablets by mouth 2 (two) times daily. Standard Process Inflammatory Response Support (Cataplex-F)   OVER THE COUNTER MEDICATION    Take 1 tablet by mouth daily.  Standard Process Supports energy cell production (Organically bound minerals)   OVER THE COUNTER MEDICATION    Take 2 tablets by mouth 2 (two) times daily. Standard Process For strong, Healthy bones ( Calcifood)   OVER THE COUNTER MEDICATION    Take 2 tablets by mouth daily. Standard Process Detoxification (Chlorophyll Complex)   OVER THE COUNTER MEDICATION    Take 2 tablets by mouth in the morning and at bedtime. With a meal [ Enzyme Digestive Support ] ( Zypan )   OVER THE COUNTER MEDICATION    Take 2 Pieces by mouth daily. Inflammatory response support ( Cod Liver Oil ) Softgels   TURMERIC PO    Take 4,500 mg by mouth daily.   VITAMIN D PO  Take 5,000 Units by mouth daily. Softgels  Modified Medications   No medications on file  Discontinued Medications   CALCIUM-MAGNESIUM (CAL-MAG PO)    Take 150 mg by mouth.   CHOLECALCIFEROL (VITAMIN D3) 5000 UNITS TABS    Take 1 tablet by mouth daily.   OMEGA-3 FATTY ACIDS (FISH OIL) 1000 MG CPDR    Take by mouth daily.   TURMERIC (QC TUMERIC COMPLEX PO)    Take 1,000 mg by mouth 2 (two) times daily.    Physical Exam: Vitals:   11/01/23 0922  BP: (!) 130/90  Pulse: 85  Resp: 16  Temp: (!) 96.9 F (36.1 C)  SpO2: 95%  Weight: 126 lb 9.6 oz (57.4 kg)  Height: 5' 0.6" (1.539 m)   Body mass index is 24.24 kg/m. BP Readings from Last 3 Encounters:  11/01/23 (!) 130/90  10/30/23 (!) 151/75  09/18/23 (!) 142/69   Wt Readings from Last 3 Encounters:  11/01/23 126 lb 9.6 oz (57.4 kg)  12/27/22 129 lb 6.4 oz (58.7 kg)  07/27/22 128 lb 6.4 oz (58.2 kg)    Physical Exam Constitutional:      Appearance: Normal appearance.  HENT:     Head: Normocephalic and atraumatic.  Cardiovascular:     Rate and Rhythm: Normal rate and regular rhythm.  Pulmonary:     Effort: Pulmonary effort is normal. No respiratory distress.     Breath sounds: Normal breath sounds. No wheezing.  Abdominal:     General: Bowel sounds are normal. There is no  distension.     Tenderness: There is no abdominal tenderness. There is no guarding or rebound.     Comments:    Musculoskeletal:        General: No swelling or tenderness.     Comments: No tenderness SLR  - pt c/o achy pain in post thigh  Strength intact   Neurological:     Mental Status: She is alert. Mental status is at baseline.     Sensory: No sensory deficit.     Motor: No weakness.     Labs reviewed: Basic Metabolic Panel: No results for input(s): "NA", "K", "CL", "CO2", "GLUCOSE", "BUN", "CREATININE", "CALCIUM", "MG", "PHOS", "TSH" in the last 8760 hours. Liver Function Tests: No results for input(s): "AST", "ALT", "ALKPHOS", "BILITOT", "PROT", "ALBUMIN" in the last 8760 hours. No results for input(s): "LIPASE", "AMYLASE" in the last 8760 hours. No results for input(s): "AMMONIA" in the last 8760 hours. CBC: No results for input(s): "WBC", "NEUTROABS", "HGB", "HCT", "MCV", "PLT" in the last 8760 hours. Lipid Panel: No results for input(s): "CHOL", "HDL", "LDLCALC", "TRIG", "CHOLHDL", "LDLDIRECT" in the last 8760 hours. TSH: No results for input(s): "TSH" in the last 8760 hours. A1C: No results found for: "HGBA1C"   Assessment/Plan  1. Acute right-sided low back pain without sciatica (Primary)  Pain in the right lower hip radiating down the leg for the past three months. The pain is described as achy and increases as the day progresses. There is also associated numbness in the feet that has been present for a couple of years. -Advise use of Tylenol as needed for pain. -Recommend use of heating pad and lidocaine patches for pain relief. -Refer to physical therapy for targeted exercises. - Ambulatory referral to Physical Therapy  2. Age related osteoporosis, unspecified pathological fracture presence  History of osteoporosis with a previous adverse reaction to Fosamax. Patient is currently not on any medication for osteoporosis. -Advise weight-bearing exercises and  increased intake of  calcium and vitamin D. - CBC (no diff) - Complete Metabolic Panel with eGFR - TSH - Vitamin D, 25-hydroxy  3. Other fatigue   - CBC (no diff) - Complete Metabolic Panel with eGFR - TSH  4. Mixed hyperlipidemia  Cont with cod liver oil - Lipid Panel  5. Screening for diabetes mellitus   - Hemoglobin A1C       General Health Maintenance -Order comprehensive blood work including blood counts, kidney function, liver enzymes, cholesterol levels, thyroid, vitamin D, and calcium levels. -Advise patient to continue with current exercise regimen. -Recommend a multivitamin to replace multiple separate supplements. -Schedule follow-up in six months.   Other orders - OVER THE COUNTER MEDICATION; Take 2 tablets by mouth 2 (two) times daily. Standard Process (BIOST) Skeletal & Cellular Health - OVER THE COUNTER MEDICATION; Take 1 tablet by mouth in the morning and at bedtime. Standard Process Calcium Metabolism Health (Cal-Ma Plus) - OVER THE COUNTER MEDICATION; Take 2 tablets by mouth 2 (two) times daily. Standard Process Inflammatory Response Support (Cataplex-F) - OVER THE COUNTER MEDICATION; Take 1 tablet by mouth daily. Standard Process Supports energy cell production (Organically bound minerals) - OVER THE COUNTER MEDICATION; Take 2 tablets by mouth 2 (two) times daily. Standard Process For strong, Healthy bones ( Calcifood) - OVER THE COUNTER MEDICATION; Take 2 tablets by mouth daily. Standard Process Detoxification (Chlorophyll Complex) - OVER THE COUNTER MEDICATION; Take 2 tablets by mouth in the morning and at bedtime. With a meal [ Enzyme Digestive Support ] ( Zypan ) - OVER THE COUNTER MEDICATION; Take 2 Pieces by mouth daily. Inflammatory response support ( Cod Liver Oil ) Softgels - Collagen-Vitamin C-Biotin (COLLAGEN PO); Take 20 g by mouth daily. Vital Proteins Collagen Peptides - VITAMIN D PO; Take 5,000 Units by mouth daily. Softgels - TURMERIC PO; Take  4,500 mg by mouth daily. - MAGNESIUM GLYCINATE PO; Take 400 mg by mouth 3 (three) times daily.

## 2023-11-03 ENCOUNTER — Other Ambulatory Visit: Payer: Medicare HMO

## 2023-11-03 DIAGNOSIS — M810 Age-related osteoporosis without current pathological fracture: Secondary | ICD-10-CM | POA: Diagnosis not present

## 2023-11-03 DIAGNOSIS — R5383 Other fatigue: Secondary | ICD-10-CM | POA: Diagnosis not present

## 2023-11-03 DIAGNOSIS — E782 Mixed hyperlipidemia: Secondary | ICD-10-CM | POA: Diagnosis not present

## 2023-11-03 DIAGNOSIS — Z131 Encounter for screening for diabetes mellitus: Secondary | ICD-10-CM | POA: Diagnosis not present

## 2023-11-04 LAB — COMPLETE METABOLIC PANEL WITH GFR
AG Ratio: 2.2 (calc) (ref 1.0–2.5)
ALT: 11 U/L (ref 6–29)
AST: 18 U/L (ref 10–35)
Albumin: 4.4 g/dL (ref 3.6–5.1)
Alkaline phosphatase (APISO): 60 U/L (ref 37–153)
BUN: 22 mg/dL (ref 7–25)
CO2: 28 mmol/L (ref 20–32)
Calcium: 9.7 mg/dL (ref 8.6–10.4)
Chloride: 108 mmol/L (ref 98–110)
Creat: 0.67 mg/dL (ref 0.60–0.95)
Globulin: 2 g/dL (ref 1.9–3.7)
Glucose, Bld: 87 mg/dL (ref 65–99)
Potassium: 4.2 mmol/L (ref 3.5–5.3)
Sodium: 143 mmol/L (ref 135–146)
Total Bilirubin: 0.5 mg/dL (ref 0.2–1.2)
Total Protein: 6.4 g/dL (ref 6.1–8.1)
eGFR: 86 mL/min/{1.73_m2} (ref >=60)

## 2023-11-04 LAB — HEMOGLOBIN A1C
Hgb A1c MFr Bld: 5.4 %{Hb} (ref <5.7)
Mean Plasma Glucose: 108 mg/dL
eAG (mmol/L): 6 mmol/L

## 2023-11-04 LAB — LIPID PANEL
Cholesterol: 262 mg/dL — ABNORMAL HIGH (ref <200)
HDL: 88 mg/dL (ref >=50)
LDL Cholesterol (Calc): 158 mg/dL — ABNORMAL HIGH
Non-HDL Cholesterol (Calc): 174 mg/dL — ABNORMAL HIGH (ref <130)
Total CHOL/HDL Ratio: 3 (calc) (ref <5.0)
Triglycerides: 66 mg/dL (ref <150)

## 2023-11-04 LAB — CBC
HCT: 41.2 % (ref 35.0–45.0)
Hemoglobin: 13.7 g/dL (ref 11.7–15.5)
MCH: 30.8 pg (ref 27.0–33.0)
MCHC: 33.3 g/dL (ref 32.0–36.0)
MCV: 92.6 fL (ref 80.0–100.0)
MPV: 10.8 fL (ref 7.5–12.5)
Platelets: 217 10*3/uL (ref 140–400)
RBC: 4.45 10*6/uL (ref 3.80–5.10)
RDW: 12.1 % (ref 11.0–15.0)
WBC: 4.8 10*3/uL (ref 3.8–10.8)

## 2023-11-04 LAB — VITAMIN D 25 HYDROXY (VIT D DEFICIENCY, FRACTURES): Vit D, 25-Hydroxy: 44 ng/mL (ref 30–100)

## 2023-11-04 LAB — TSH: TSH: 2.49 m[IU]/L (ref 0.40–4.50)

## 2023-11-06 ENCOUNTER — Encounter: Payer: Self-pay | Admitting: Obstetrics and Gynecology

## 2023-11-07 ENCOUNTER — Encounter: Payer: Self-pay | Admitting: Physical Therapy

## 2023-11-07 ENCOUNTER — Ambulatory Visit: Payer: Medicare HMO | Attending: Sports Medicine | Admitting: Physical Therapy

## 2023-11-07 ENCOUNTER — Encounter: Payer: Self-pay | Admitting: Sports Medicine

## 2023-11-07 ENCOUNTER — Other Ambulatory Visit: Payer: Self-pay

## 2023-11-07 DIAGNOSIS — M545 Low back pain, unspecified: Secondary | ICD-10-CM | POA: Diagnosis not present

## 2023-11-07 DIAGNOSIS — M6281 Muscle weakness (generalized): Secondary | ICD-10-CM | POA: Diagnosis not present

## 2023-11-07 NOTE — Therapy (Signed)
 OUTPATIENT PHYSICAL THERAPY THORACOLUMBAR EVALUATION   Patient Name: Terry Edwards MRN: [id number] DOB:1936-04-02, 88 y.o., female Today's Date: 11/07/2023  END OF SESSION:  PT End of Session - 11/07/23 1403     Visit Number 1    Date for PT Re-Evaluation 01/02/24    Authorization Type aetna Medicare    PT Start Time 1401    PT Stop Time 1440    PT Time Calculation (min) 39 min    Activity Tolerance Patient tolerated treatment well             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   Cataract    removed bilateral eyes   Chronic hepatitis B without hepatic coma (HCC) 1970s   chronic elev AFP   Cyst of pineal gland    incidental on MRI   Heart murmur    Hemorrhoids    History of chicken pox    History of depression 2006   situational, on Lexapro for 1 year   Hyperlipidemia    Osteopenia    hx fx 5th MT R foot 03/2013   Plantar fasciitis of left foot    Per [facility] new patient packet   Right rotator cuff tendonitis 2013   resolved with PT (injections not helpful)   Urine incontinence    Vitamin D  deficiency    Past Surgical History:  Procedure Laterality Date   APPENDECTOMY  1957   BLEPHAROPLASTY     Per [facility] new patient packet   COLONOSCOPY     EYE SURGERY Bilateral 03/09/15, 03/23/15   cataract removal   TONSILLECTOMY  1940's   Patient Active Problem List   Diagnosis Date Noted   Hardening of the aorta (main artery of the heart) (HCC) 06/14/2023   Thoracic aortic aneurysm (HCC) 07/27/2022   Elevated blood pressure reading without diagnosis of hypertension 11/13/2021   Right hand pain 11/13/2021   Impingement syndrome of shoulder region 10/25/2021   AF (amaurosis fugax) 07/09/2021   Drug-induced skin rash 04/23/2021   Cyst of joint of hand, right 01/26/2021   Thinning hair 10/12/2020   Murmur, cardiac 10/12/2020   Traumatic incomplete tear  of right rotator cuff 10/12/2020   Urge incontinence 10/12/2020   Routine general medical examination at a health care facility 01/09/2015   Chronic toe pain, right foot 09/03/2014   Cyst of pineal gland    Vitamin D  deficiency    Osteoporosis    Hx of type B viral hepatitis    Hyperlipidemia     PCP: Sherlynn Greenland MD  REFERRING PROVIDER: Sherlynn Greenland MD  REFERRING DIAG: M54.50 acute right sided low back pain without sciatica  Rationale for Evaluation and Treatment: Rehabilitation  THERAPY DIAG:  Back pain; weakness ONSET DATE: 3 months  SUBJECTIVE:  SUBJECTIVE STATEMENT: Symptoms began 2-3 months ago for no apparent reason.  Pain in bil low back, right SI region, right posterior thigh (upper to mid way) Does upper body weights at home, doesn't do lower body per se Had previous pelvic floor PT at this facility Had acupuncture (helpful)  PERTINENT HISTORY:  Osteoporosis; thoracic aortic aneurysm; bil foot neuropathy (numbness); right rotator cuff tear  PAIN:   Are you having pain? Yes NPRS scale: 2/10; after 2 1/2 mile walk pain 3-4/10 Pain location: central LBP, right SI region, right posterior thigh upper to midway Pain orientation: Right  PAIN TYPE: aching Pain description: intermittent  Aggravating factors: as the day goes on, sitting on a hard chair, sitting with legs crossed, sometimes walking; sidelying with legs bent Relieving factors: Tylenol, AM; PRECAUTIONS: None  WEIGHT BEARING RESTRICTIONS: No  FALLS:  Has patient fallen in last 6 months? No LIVING ENVIRONMENT: Lives with: lives alone Lives in: House/apartment OCCUPATION: retired  PLOF: Independent  PATIENT GOALS: get rid of this discomfort, be able to walk without pain;  (sitting) do art work, sit  for reading , cooking  NEXT MD VISIT: as needed   OBJECTIVE:  Note: Objective measures were completed at Evaluation unless otherwise noted.  DIAGNOSTIC FINDINGS:  2022: Results:   Lumbar spine L1-L4 Femoral neck (FN) 33% distal radius  T-score -2.6 RFN: -1.6 LFN: -2.0 n/a  Change in BMD from previous DXA test (%) Down 0.7% Down 2.0% n/a  (*) statistically significant  PATIENT SURVEYS:  Modified Oswestry 10%   COGNITION: Overall cognitive status: Within functional limits for tasks assessed     MUSCLE LENGTH:  neural tension right with slump test and SLR;  decreased right hip flexor length  POSTURE: decreased lumbar lordosis  PALPATION: Tender points in gluteals and piriformis on right; minimal tenderness ischial tuberosity and proximal HS  LUMBAR ROM:   AROM eval  Flexion WFLs  Extension 15   Right lateral flexion 20  Left lateral flexion 20  Right rotation   Left rotation    (Blank rows = not tested)  TRUNK STRENGTH:  Decreased activation of transverse abdominus muscles; abdominals 4-/5; decreased activation of lumbar multifidi; trunk extensors 4-/5   LOWER EXTREMITY ROM:   WFLs   LOWER EXTREMITY MMT:  decreased right hip abduction 4-/5; left SLS 8 sec with ease, right SLS 5 sec with pelvic drop and inc difficulty stabilizing  LUMBAR SPECIAL TESTS:  + neural tension on right with slump test and SLR  FUNCTIONAL TESTS:  5x STS no Ues 10.3 sec  GAIT: Comments:   TREATMENT DATE: 11/07/23                                                                                                                              Self care: limit sitting for prolonged periods of time, use lumbar roll (feels good), feet resting on floor, limit sitting cross legged Avoid overstretching piriformis/sciatic tissues Initial HEP  PATIENT EDUCATION:  Education details: Educated patient on anatomy and physiology of current symptoms, prognosis, plan of care as well as initial self care  strategies to promote recovery Person educated: Patient Education method: Explanation Education comprehension: verbalized understanding  HOME EXERCISE PROGRAM: Has multiple bands at home; used red band in clinic Access Code: [email or web address] URL: [email or web address] Date: 11/07/2023 Prepared by: Glade Pesa  Exercises - Clamshell with Resistance  - 1 x daily - 7 x weekly - 1 sets - 5 hold (do a small ROM only) - Supine Bridge with Resistance Band  - 1 x daily - 7 x weekly - 1 sets - 7-10 reps - Supine March  - 1 x daily - 7 x weekly - 1 sets - 7-10 reps - Sit to Stand  - 1 x daily - 7 x weekly - 2 sets - 5 reps  ASSESSMENT:  CLINICAL IMPRESSION: Patient is a 88 y.o. female who was seen today for physical therapy evaluation and treatment for acute right sided low back pain with pain radiating to mid posterior right thigh.  Symptoms worsened as the day goes on, sitting on a hard chair and sometimes with walking.  Weakness in right gluteus medius with pelvic drop noted with single leg standing and sidelying hip abduction.  + sciatic neural tension.  Numerous tender points in gluteal and piriformis muscles.  She would benefit from PT to address these impairments which affect daily function.   OBJECTIVE IMPAIRMENTS: decreased activity tolerance, difficulty walking, decreased strength, increased fascial restrictions, impaired perceived functional ability, and pain.   ACTIVITY LIMITATIONS: sitting, sleeping, and locomotion level  PARTICIPATION LIMITATIONS: meal prep, cleaning, laundry, driving, and community activity  PERSONAL FACTORS: 1-2 comorbidities: osteoporosis, bil neuropathy in feet  are also affecting patient's functional outcome.   REHAB POTENTIAL: Good  CLINICAL DECISION MAKING: Stable/uncomplicated  EVALUATION COMPLEXITY: Low   GOALS: Goals reviewed with patient? Yes  SHORT TERM GOALS: Target date: 12/05/2023    The patient will demonstrate knowledge of basic  self care strategies and exercises to promote healing  Baseline: Goal status: INITIAL  2.  The patient will report a 40% improvement in pain levels with functional activities which are currently difficult including walking, sitting Baseline:  Goal status: INITIAL  3.  The patient will have improved hip strength to at least 4/5 needed for standing, walking longer distances and descending stairs at home and in the community  Baseline:  Goal status: INITIAL    LONG TERM GOALS: Target date: 01/02/2024   The patient will be independent in a safe self progression of a home exercise program to promote further recovery of function   Baseline:  Goal status: INITIAL  2.  The patient will report a 75% improvement in pain levels with functional activities which are currently difficult including walking, sitting, as the day goes on Baseline:  Goal status: INITIAL  3.  The patient will have improved hip strength to at least 4+/5 needed for standing, walking longer distances and descending stairs at home and in the community  Baseline:  Goal status: INITIAL  4.  Able to walk 2.5 miles with pain level 2/10 Baseline:  Goal status: INITIAL  5.  Modified Oswestry score improved to 7% Baseline:  Goal status: INITIAL   PLAN:  PT FREQUENCY: 2x/week  PT DURATION: 8 weeks  PLANNED INTERVENTIONS: 97164- PT Re-evaluation, 97110-Therapeutic exercises, 97530- Therapeutic activity, [id number]- Neuromuscular re-education, 97535- Self Care, 02859- Manual therapy, [id number]- Aquatic Therapy, 97014- Electrical stimulation (unattended), [id number]- Electrical stimulation (  manual), 02983- Vasopneumatic device, [id number]- Ultrasound, [id number]- Traction (mechanical), 02966- Ionotophoresis 4mg /ml Dexamethasone, Patient/Family education, Balance training, Stair training, Taping, Dry Needling, Joint mobilization, Spinal mobilization, Cryotherapy, and Moist heat.  PLAN FOR NEXT SESSION:  patient open to DN to gluteals, piriformis  and possibly prox HS (had success with acupuncture with ES in past); review HEP; may add supine neural flossing; progressive right glute medius strengthening  Glade Pesa, PT 11/07/23 10:02 PM Phone: [phone] Fax: [phone]

## 2023-11-07 NOTE — Telephone Encounter (Signed)
Patient just refused to take cholesterol medication and has question. Message routed to PCP Venita Sheffield, MD

## 2023-11-09 NOTE — Telephone Encounter (Signed)
 MyChart message sent to patient.

## 2023-11-09 NOTE — Therapy (Signed)
 OUTPATIENT PHYSICAL THERAPY THORACOLUMBAR TREATMENT   Patient Name: Terry Edwards MRN: [id number] DOB:March 12, 1936, 88 y.o., female Today's Date: 11/10/2023  END OF SESSION:  PT End of Session - 11/10/23 0928     Visit Number 2    Date for PT Re-Evaluation 01/02/24    Authorization Type aetna Medicare    Progress Note Due on Visit 10    PT Start Time 0929    PT Stop Time 1023    PT Time Calculation (min) 54 min    Activity Tolerance Patient tolerated treatment well    Behavior During Therapy [facility] for tasks assessed/performed              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   Cataract    removed bilateral eyes   Chronic hepatitis B without hepatic coma (HCC) 1970s   chronic elev AFP   Cyst of pineal gland    incidental on MRI   Heart murmur    Hemorrhoids    History of chicken pox    History of depression 2006   situational, on Lexapro for 1 year   Hyperlipidemia    Osteopenia    hx fx 5th MT R foot 03/2013   Plantar fasciitis of left foot    Per [facility] new patient packet   Right rotator cuff tendonitis 2013   resolved with PT (injections not helpful)   Urine incontinence    Vitamin D  deficiency    Past Surgical History:  Procedure Laterality Date   APPENDECTOMY  1957   BLEPHAROPLASTY     Per [facility] new patient packet   COLONOSCOPY     EYE SURGERY Bilateral 03/09/15, 03/23/15   cataract removal   TONSILLECTOMY  1940's   Patient Active Problem List   Diagnosis Date Noted   Hardening of the aorta (main artery of the heart) (HCC) 06/14/2023   Thoracic aortic aneurysm (HCC) 07/27/2022   Elevated blood pressure reading without diagnosis of hypertension 11/13/2021   Right hand pain 11/13/2021   Impingement syndrome of shoulder region 10/25/2021   AF (amaurosis fugax) 07/09/2021   Drug-induced skin rash 04/23/2021   Cyst of joint of hand, right  01/26/2021   Thinning hair 10/12/2020   Murmur, cardiac 10/12/2020   Traumatic incomplete tear of right rotator cuff 10/12/2020   Urge incontinence 10/12/2020   Routine general medical examination at a health care facility 01/09/2015   Chronic toe pain, right foot 09/03/2014   Cyst of pineal gland    Vitamin D  deficiency    Osteoporosis    Hx of type B viral hepatitis    Hyperlipidemia     PCP: Sherlynn Greenland MD  REFERRING PROVIDER: Sherlynn Greenland MD  REFERRING DIAG: M54.50 acute right sided low back pain without sciatica  Rationale for Evaluation and Treatment: Rehabilitation  THERAPY DIAG:  Back pain; weakness ONSET DATE: 3 months  SUBJECTIVE:  SUBJECTIVE STATEMENT: The exercises with the band ruined me I couldn't sleep that night.  The hip hurt a lot from those.  The others are ok.     Intake history from 11/06/22:  Symptoms began 2-3 months ago for no apparent reason.  Pain in bil low back, right SI region, right posterior thigh (upper to mid way) Does upper body weights at home, doesn't do lower body per se Had previous pelvic floor PT at this facility Had acupuncture (helpful)  PERTINENT HISTORY:  Osteoporosis; thoracic aortic aneurysm; bil foot neuropathy (numbness); right rotator cuff tear  PAIN:   Are you having pain? Yes NPRS scale: 2/10; after 2 1/2 mile walk pain 3-4/10 Pain location: central LBP, right SI region, right posterior thigh upper to midway Pain orientation: Right  PAIN TYPE: aching Pain description: intermittent  Aggravating factors: as the day goes on, sitting on a hard chair, sitting with legs crossed, sometimes walking; sidelying with legs bent Relieving factors: Tylenol, AM; PRECAUTIONS: None  WEIGHT BEARING RESTRICTIONS: No  FALLS:   Has patient fallen in last 6 months? No LIVING ENVIRONMENT: Lives with: lives alone Lives in: House/apartment OCCUPATION: retired  PLOF: Independent  PATIENT GOALS: get rid of this discomfort, be able to walk without pain;  (sitting) do art work, sit for reading , cooking  NEXT MD VISIT: as needed   OBJECTIVE:  Note: Objective measures were completed at Evaluation unless otherwise noted.  DIAGNOSTIC FINDINGS:  2022: Results:   Lumbar spine L1-L4 Femoral neck (FN) 33% distal radius  T-score -2.6 RFN: -1.6 LFN: -2.0 n/a  Change in BMD from previous DXA test (%) Down 0.7% Down 2.0% n/a  (*) statistically significant  PATIENT SURVEYS:  Modified Oswestry 10%   COGNITION: Overall cognitive status: Within functional limits for tasks assessed     MUSCLE LENGTH:  neural tension right with slump test and SLR;  decreased right hip flexor length  POSTURE: decreased lumbar lordosis  PALPATION: Tender points in gluteals and piriformis on right; minimal tenderness ischial tuberosity and proximal HS  LUMBAR ROM:   AROM eval  Flexion WFLs  Extension 15   Right lateral flexion 20  Left lateral flexion 20  Right rotation   Left rotation    (Blank rows = not tested)  TRUNK STRENGTH:  Decreased activation of transverse abdominus muscles; abdominals 4-/5; decreased activation of lumbar multifidi; trunk extensors 4-/5   LOWER EXTREMITY ROM:   WFLs   LOWER EXTREMITY MMT:  decreased right hip abduction 4-/5; left SLS 8 sec with ease, right SLS 5 sec with pelvic drop and inc difficulty stabilizing  LUMBAR SPECIAL TESTS:  + neural tension on right with slump test and SLR  FUNCTIONAL TESTS:  5x STS no Ues 10.3 sec  GAIT: Comments:   TREATMENT DATE:  11/10/23: Attempted NuStep but just getting into position caused Rt hip pain so d/c'd Supine SKTC Rt 5x5 Piriformis stretch 2x20 Rt only Supine sciatic flossing Rt 2x10 knee extensions Supine LTR A/ROM x 8 reps back and forth  (good stretch no pain in Rt hip) Sit to stand from low mat table - needed cues to use hip hinge, no pain x10 reps Rt modified thomas test stretch off side of table x60 - good stretch Supine march with emphasis on TA engagement especially with lowering phase of march x 2' alt LE Trigger Point Dry Needling  Initial Treatment: Pt instructed on Dry Needling rational, procedures, and possible side effects. Pt instructed to expect mild to moderate muscle  soreness later in the day and/or into the next day.  Pt instructed in methods to reduce muscle soreness. Patient verbalized understanding of these instructions and education.   Patient Verbal Consent Given: Yes Education Handout Provided: Yes Muscles Treated: Rt glut max, glut med, glut min, piriformis - marinating technique Electrical Stimulation Performed: No Treatment Response/Outcome: release of tone with improved muscle extensibility and tenderness to palpation  Moist heat Rt hip x 8'  11/07/23                                                                                                                              Self care: limit sitting for prolonged periods of time, use lumbar roll (feels good), feet resting on floor, limit sitting cross legged Avoid overstretching piriformis/sciatic tissues Initial HEP   PATIENT EDUCATION:  Education details: Educated patient on anatomy and physiology of current symptoms, prognosis, plan of care as well as initial self care strategies to promote recovery Person educated: Patient Education method: Explanation Education comprehension: verbalized understanding  HOME EXERCISE PROGRAM: Will HOLD red band exercises for now - were too painful.   Access Code: [email or web address] URL: [email or web address] Date: 11/10/2023 Prepared by: Orvil Furqan Gosselin  Exercises - Clamshell with Resistance  - 1 x daily - 7 x weekly - 1 sets - 5 hold - Supine Bridge with Resistance Band  - 1 x daily - 7 x weekly  - 1 sets - 7-10 reps - Supine March  - 1 x daily - 7 x weekly - 1 sets - 7-10 reps - Sit to Stand  - 1 x daily - 7 x weekly - 2 sets - 5 reps - Hooklying Single Knee to Chest Stretch  - 1 x daily - 7 x weekly - 1 sets - 10 reps - 5 hold - Supine Piriformis Stretch with Foot on Ground  - 1 x daily - 7 x weekly - 1 sets - 2 reps - 30 hold - Supine Sciatic Nerve Glide  - 1 x daily - 7 x weekly - 2 sets - 10 reps - Supine Lower Trunk Rotation  - 1 x daily - 7 x weekly - 1 sets - 10 reps - Modified Thomas Stretch  - 1 x daily - 7 x weekly - 1 sets - 2 reps - 30 hold  ASSESSMENT:  CLINICAL IMPRESSION: Patient reported efforts to attempt banded hip exercises from initial evaluation were too painful and caused her disrupted sleep.  She has held on them for now which PT agreed with until we can use other techniques to reduce the hip pain.  PT progressed flexibility, neural mobs and spine and hip ROM today and updated HEP.  Pt needed VC and demo to include hip hinge into her sit to stand which she was able to demo without pain and good form.  She felt a good stretch in modified Thomas test stretch for Rt hip flexor so this was added as well.  PT used skilled palpation to identify TP in lateral and posterior aspect of Rt hip and used marinating technique with DN.  Initial cramping and twitches on needle insertion in glut min, med and piriformis.  Massage and moist heat used to minimize soreness.  Pt given handout for DN info and updated HEP.  OBJECTIVE IMPAIRMENTS: decreased activity tolerance, difficulty walking, decreased strength, increased fascial restrictions, impaired perceived functional ability, and pain.   ACTIVITY LIMITATIONS: sitting, sleeping, and locomotion level  PARTICIPATION LIMITATIONS: meal prep, cleaning, laundry, driving, and community activity  PERSONAL FACTORS: 1-2 comorbidities: osteoporosis, bil neuropathy in feet  are also affecting patient's functional outcome.   REHAB POTENTIAL:  Good  CLINICAL DECISION MAKING: Stable/uncomplicated  EVALUATION COMPLEXITY: Low   GOALS: Goals reviewed with patient? Yes  SHORT TERM GOALS: Target date: 12/05/2023    The patient will demonstrate knowledge of basic self care strategies and exercises to promote healing  Baseline: Goal status: ongoing  2.  The patient will report a 40% improvement in pain levels with functional activities which are currently difficult including walking, sitting Baseline:  Goal status: INITIAL  3.  The patient will have improved hip strength to at least 4/5 needed for standing, walking longer distances and descending stairs at home and in the community  Baseline:  Goal status: INITIAL    LONG TERM GOALS: Target date: 01/02/2024   The patient will be independent in a safe self progression of a home exercise program to promote further recovery of function   Baseline:  Goal status: INITIAL  2.  The patient will report a 75% improvement in pain levels with functional activities which are currently difficult including walking, sitting, as the day goes on Baseline:  Goal status: INITIAL  3.  The patient will have improved hip strength to at least 4+/5 needed for standing, walking longer distances and descending stairs at home and in the community  Baseline:  Goal status: INITIAL  4.  Able to walk 2.5 miles with pain level 2/10 Baseline:  Goal status: INITIAL  5.  Modified Oswestry score improved to 7% Baseline:  Goal status: INITIAL   PLAN:  PT FREQUENCY: 2x/week  PT DURATION: 8 weeks  PLANNED INTERVENTIONS: 97164- PT Re-evaluation, 97110-Therapeutic exercises, 97530- Therapeutic activity, 97112- Neuromuscular re-education, 97535- Self Care, 02859- Manual therapy, [id number]- Aquatic Therapy, 97014- Electrical stimulation (unattended), [id number]- Electrical stimulation (manual), [id number]- Vasopneumatic device, [id number]- Ultrasound, [id number]- Traction (mechanical), [id number]- Ionotophoresis 4mg /ml  Dexamethasone, Patient/Family education, Balance training, Stair training, Taping, Dry Needling, Joint mobilization, Spinal mobilization, Cryotherapy, and Moist heat.  PLAN FOR NEXT SESSION:  f/u on DN#1 to Rt lateral hip, review HEP and progress core (progress supine march to dead bug, try Rt LE 2 lateral step up, leg press, seated abs) and Rt gluteal strength (has + Trendelenburg and hip abd/ext weakness)  Maurisa Tesmer, PT 11/10/23 12:13 PM  Phone: [phone] Fax: [phone]

## 2023-11-10 ENCOUNTER — Encounter: Payer: Self-pay | Admitting: Physical Therapy

## 2023-11-10 ENCOUNTER — Ambulatory Visit: Payer: Medicare HMO | Admitting: Physical Therapy

## 2023-11-10 DIAGNOSIS — M6281 Muscle weakness (generalized): Secondary | ICD-10-CM

## 2023-11-10 DIAGNOSIS — R293 Abnormal posture: Secondary | ICD-10-CM

## 2023-11-10 DIAGNOSIS — M545 Low back pain, unspecified: Secondary | ICD-10-CM

## 2023-11-10 DIAGNOSIS — R279 Unspecified lack of coordination: Secondary | ICD-10-CM

## 2023-11-10 NOTE — Patient Instructions (Signed)

## 2023-11-14 ENCOUNTER — Ambulatory Visit: Payer: Medicare HMO | Admitting: Physical Therapy

## 2023-11-14 DIAGNOSIS — M6281 Muscle weakness (generalized): Secondary | ICD-10-CM

## 2023-11-14 DIAGNOSIS — M545 Low back pain, unspecified: Secondary | ICD-10-CM

## 2023-11-14 NOTE — Therapy (Signed)
OUTPATIENT PHYSICAL THERAPY THORACOLUMBAR TREATMENT   Patient Name: Terry Edwards MRN: [id number] DOB:Oct 02, 1936, 88 y.o., female Today's Date: 11/14/2023  END OF SESSION:  PT End of Session - 11/14/23 1015     Visit Number 3    Date for PT Re-Evaluation 01/02/24    Authorization Type [employer]    Progress Note Due on Visit 10    PT Start Time 1015    PT Stop Time 1055    PT Time Calculation (min) 40 min    Activity Tolerance Patient tolerated treatment well              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   Cataract    removed bilateral eyes   Chronic hepatitis B without hepatic coma (HCC) 1970s   chronic elev AFP   Cyst of pineal gland    incidental on MRI   Heart murmur    Hemorrhoids    History of chicken pox    History of depression 2006   situational, on Lexapro for 1 year   Hyperlipidemia    Osteopenia    hx fx 5th MT R foot 03/2013   Plantar fasciitis of left foot    Per [facility] new patient packet   Right rotator cuff tendonitis 2013   resolved with PT (injections not helpful)   Urine incontinence    Vitamin D deficiency    Past Surgical History:  Procedure Laterality Date   APPENDECTOMY  1957   BLEPHAROPLASTY     Per [facility] new patient packet   COLONOSCOPY     EYE SURGERY Bilateral 03/09/15, 03/23/15   cataract removal   TONSILLECTOMY  1940's   Patient Active Problem List   Diagnosis Date Noted   Hardening of the aorta (main artery of the heart) (HCC) 06/14/2023   Thoracic aortic aneurysm (HCC) 07/27/2022   Elevated blood pressure reading without diagnosis of hypertension 11/13/2021   Right hand pain 11/13/2021   Impingement syndrome of shoulder region 10/25/2021   AF (amaurosis fugax) 07/09/2021   Drug-induced skin rash 04/23/2021   Cyst of joint of hand, right 01/26/2021   Thinning hair 10/12/2020   Murmur, cardiac  10/12/2020   Traumatic incomplete tear of right rotator cuff 10/12/2020   Urge incontinence 10/12/2020   Routine general medical examination at a health care facility 01/09/2015   Chronic toe pain, right foot 09/03/2014   Cyst of pineal gland    Vitamin D deficiency    Osteoporosis    Hx of type B viral hepatitis    Hyperlipidemia     PCP: Jackelyn Hoehn MD  REFERRING PROVIDER: Jackelyn Hoehn MD  REFERRING DIAG: M54.50 acute right sided low back pain without sciatica  Rationale for Evaluation and Treatment: Rehabilitation  THERAPY DIAG:  Back pain; weakness ONSET DATE: 3 months  SUBJECTIVE:  SUBJECTIVE STATEMENT: I was achey after the DN for 24 hours.  It moved from the hip to the top of the posterior thigh.  I can't tell yet if it helped but I'm open to doing it again (Thursday).  Some increased discomfort with knee to opp shoulder stretch.     Intake history from 11/06/22:  Symptoms began 2-3 months ago for no apparent reason.  Pain in bil low back, right SI region, right posterior thigh (upper to mid way) Does upper body weights at home, doesn't do lower body per se Had previous pelvic floor PT at this facility Had acupuncture (helpful)  PERTINENT HISTORY:  Osteoporosis; thoracic aortic aneurysm; bil foot neuropathy (numbness); right rotator cuff tear  PAIN:   Are you having pain? Yes NPRS scale: 2/10 Pain location: central LBP, right SI region, right posterior thigh upper to midway Pain orientation: Right  PAIN TYPE: aching Pain description: intermittent  Aggravating factors: as the day goes on, sitting on a hard chair, sitting with legs crossed, sometimes walking; sidelying with legs bent Relieving factors: Tylenol, AM; PRECAUTIONS: None  WEIGHT BEARING  RESTRICTIONS: No  FALLS:  Has patient fallen in last 6 months? No LIVING ENVIRONMENT: Lives with: lives alone Lives in: House/apartment OCCUPATION: retired  PLOF: Independent  PATIENT GOALS: get rid of this discomfort, be able to walk without pain;  (sitting) do art work, sit for reading , cooking  NEXT MD VISIT: as needed   OBJECTIVE:  Note: Objective measures were completed at Evaluation unless otherwise noted.  DIAGNOSTIC FINDINGS:  2022: Results:   Lumbar spine L1-L4 Femoral neck (FN) 33% distal radius  T-score -2.6 RFN: -1.6 LFN: -2.0 n/a  Change in BMD from previous DXA test (%) Down 0.7% Down 2.0% n/a  (*) statistically significant  PATIENT SURVEYS:  Modified Oswestry 10%   COGNITION: Overall cognitive status: Within functional limits for tasks assessed     MUSCLE LENGTH:  neural tension right with slump test and SLR;  decreased right hip flexor length  POSTURE: decreased lumbar lordosis  PALPATION: Tender points in gluteals and piriformis on right; minimal tenderness ischial tuberosity and proximal HS  LUMBAR ROM:   AROM eval  Flexion WFLs  Extension 15   Right lateral flexion 20  Left lateral flexion 20  Right rotation   Left rotation    (Blank rows = not tested)  TRUNK STRENGTH:  Decreased activation of transverse abdominus muscles; abdominals 4-/5; decreased activation of lumbar multifidi; trunk extensors 4-/5   LOWER EXTREMITY ROM:   WFLs   LOWER EXTREMITY MMT:  decreased right hip abduction 4-/5; left SLS 8 sec with ease, right SLS 5 sec with pelvic drop and inc difficulty stabilizing  LUMBAR SPECIAL TESTS:  + neural tension on right with slump test and SLR  FUNCTIONAL TESTS:  5x STS no Ues 10.3 sec  GAIT: Comments:   TREATMENT DATE:  11/14/23: Self care: Discussed limiting sitting and using lumbar roll to avoid sacral sitting and pressure on irritated tissues used pelvic model for visual; discussed limiting overstretching piriformis if  pain increases to a 5/10 or pain persists for a while after ex Sit to stand 8x (no pain) Seated self resisted hip abduction 5 sec hold 8x Standing in front of the wall, holding to back of the chair with hip extension isometric 5x 5 sec hold right/left (added to HEP) Standing between chair and wall hip abduction isometric  ball on wall 5x 5 sec hold right/left (added to HEP pt to use  pillow at home) Supine neural sciatic floss 8x (some pain) Bridge 8x (HS cramp) Supine hand to knee isometric push same side 8x 5 sec hold (added to HEP)  11/10/23: Attempted NuStep but just getting into position caused Rt hip pain so d/c'd Supine SKTC Rt 5x5" Piriformis stretch 2x20" Rt only Supine sciatic flossing Rt 2x10 knee extensions Supine LTR A/ROM x 8 reps back and forth (good stretch no pain in Rt hip) Sit to stand from low mat table - needed cues to use hip hinge, no pain x10 reps Rt modified thomas test stretch off side of table x60" - good stretch Supine march with emphasis on TA engagement especially with lowering phase of march x 2' alt LE Trigger Point Dry Needling  Initial Treatment: Pt instructed on Dry Needling rational, procedures, and possible side effects. Pt instructed to expect mild to moderate muscle soreness later in the day and/or into the next day.  Pt instructed in methods to reduce muscle soreness. Patient verbalized understanding of these instructions and education.   Patient Verbal Consent Given: Yes Education Handout Provided: Yes Muscles Treated: Rt glut max, glut med, glut min, piriformis - marinating technique Electrical Stimulation Performed: No Treatment Response/Outcome: release of tone with improved muscle extensibility and tenderness to palpation  Moist heat Rt hip x 8'  11/07/23                                                                                                                              Self care: limit sitting for prolonged periods of time, use lumbar  roll (feels good), feet resting on floor, limit sitting cross legged Avoid overstretching piriformis/sciatic tissues Initial HEP   PATIENT EDUCATION:  Education details: Educated patient on anatomy and physiology of current symptoms, prognosis, plan of care as well as initial self care strategies to promote recovery Person educated: Patient Education method: Explanation Education comprehension: verbalized understanding  HOME EXERCISE PROGRAM: Access Code: [email or web address] URL: [email or web address] Date: 11/14/2023 Prepared by: Lavinia Sharps  Exercises - Clamshell with Resistance  - 1 x daily - 7 x weekly - 1 sets - 5 hold - Supine Bridge with Resistance Band  - 1 x daily - 7 x weekly - 1 sets - 7-10 reps - Supine March  - 1 x daily - 7 x weekly - 1 sets - 7-10 reps - Sit to Stand  - 1 x daily - 7 x weekly - 2 sets - 5 reps - Hooklying Single Knee to Chest Stretch  - 1 x daily - 7 x weekly - 1 sets - 10 reps - 5 hold - Supine Piriformis Stretch with Foot on Ground  - 1 x daily - 7 x weekly - 1 sets - 2 reps - 30 hold - Supine Sciatic Nerve Glide  - 1 x daily - 7 x weekly - 2 sets - 10 reps - Supine Lower Trunk Rotation  - 1 x daily - 7 x weekly -  1 sets - 10 reps - Modified Thomas Stretch  - 1 x daily - 7 x weekly - 1 sets - 2 reps - 30 hold - Supine Posterior Pelvic Tilt  - 1 x daily - 7 x weekly - 1 sets - 10 reps - wall isometrics  - 1 x daily - 7 x weekly - 2 sets - 5 reps - 5 hold - Standing Isometric Hip Abduction with Ball on Wall  - 1 x daily - 7 x weekly - 2 sets - 5 reps - 5 hold - Hooklying Isometric Hip Flexion with Opposite Arm  - 1 x daily - 7 x weekly - 2 sets - 5 reps - 5 hold Will HOLD red band exercises for now - were too painful.    ASSESSMENT:  CLINICAL IMPRESSION: Patient education and modifications to HEP secondary to high pain sensitivity.  Larger movements (clam) and piriformis stretching seem to aggravate pain therefore we changed exercise focus to  be more submaximal isometric. She responds well to this with pain levels remaining low throughout the session. Therapist providing verbal cues to optimize technique with  exercises in order to achieve the greatest benefit.  She would benefit from DN next visit to address remaining tender points and taut bands.    OBJECTIVE IMPAIRMENTS: decreased activity tolerance, difficulty walking, decreased strength, increased fascial restrictions, impaired perceived functional ability, and pain.   ACTIVITY LIMITATIONS: sitting, sleeping, and locomotion level  PARTICIPATION LIMITATIONS: meal prep, cleaning, laundry, driving, and community activity  PERSONAL FACTORS: 1-2 comorbidities: osteoporosis, bil neuropathy in feet  are also affecting patient's functional outcome.   REHAB POTENTIAL: Good  CLINICAL DECISION MAKING: Stable/uncomplicated  EVALUATION COMPLEXITY: Low   GOALS: Goals reviewed with patient? Yes  SHORT TERM GOALS: Target date: 12/05/2023    The patient will demonstrate knowledge of basic self care strategies and exercises to promote healing  Baseline: Goal status: ongoing  2.  The patient will report a 40% improvement in pain levels with functional activities which are currently difficult including walking, sitting Baseline:  Goal status: INITIAL  3.  The patient will have improved hip strength to at least 4/5 needed for standing, walking longer distances and descending stairs at home and in the community  Baseline:  Goal status: INITIAL    LONG TERM GOALS: Target date: 01/02/2024   The patient will be independent in a safe self progression of a home exercise program to promote further recovery of function   Baseline:  Goal status: INITIAL  2.  The patient will report a 75% improvement in pain levels with functional activities which are currently difficult including walking, sitting, as the day goes on Baseline:  Goal status: INITIAL  3.  The patient will have improved  hip strength to at least 4+/5 needed for standing, walking longer distances and descending stairs at home and in the community  Baseline:  Goal status: INITIAL  4.  Able to walk 2.5 miles with pain level 2/10 Baseline:  Goal status: INITIAL  5.  Modified Oswestry score improved to 7% Baseline:  Goal status: INITIAL   PLAN:  PT FREQUENCY: 2x/week  PT DURATION: 8 weeks  PLANNED INTERVENTIONS: 97164- PT Re-evaluation, 97110-Therapeutic exercises, 97530- Therapeutic activity, 97112- Neuromuscular re-education, 97535- Self Care, 10272- Manual therapy, [id number]- Aquatic Therapy, 97014- Electrical stimulation (unattended), [id number]- Electrical stimulation (manual), [id number]- Vasopneumatic device, [id number]- Ultrasound, [id number]- Traction (mechanical), [id number]- Ionotophoresis 4mg /ml Dexamethasone, Patient/Family education, Balance training, Stair training, Taping, Dry Needling, Joint mobilization, Spinal mobilization, Cryotherapy, and  Moist heat.  PLAN FOR NEXT SESSION:  next visit DN#2 to Rt lateral hip, high symptom irritability hold on piriformis stretching (supine sciatic glide OK); review HEP and progress core (progress supine march to dead bug, try Rt LE 2" lateral step up,  seated abs) and Rt gluteal strength (has + Trendelenburg and hip abd/ext weakness)   Lavinia Sharps, PT 11/14/23 1:05 PM Phone: [phone] Fax: [phone]  Phone: [phone] Fax: [phone]

## 2023-11-15 NOTE — Therapy (Signed)
OUTPATIENT PHYSICAL THERAPY THORACOLUMBAR TREATMENT   Patient Name: Terry Edwards MRN: [id number] DOB:[birth year], 88 y.o., female Today's Date: 11/16/2023  END OF SESSION:  PT End of Session - 11/16/23 1151     Visit Number 4    Date for PT Re-Evaluation 01/02/24    Authorization Type [employer]    Progress Note Due on Visit 10    PT Start Time 1102    PT Stop Time 1200   heat x 10 min at end of session   PT Time Calculation (min) 58 min               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   Cataract    removed bilateral eyes   Chronic hepatitis B without hepatic coma (HCC) 1970s   chronic elev AFP   Cyst of pineal gland    incidental on MRI   Heart murmur    Hemorrhoids    History of chicken pox    History of depression 2006   situational, on Lexapro for 1 year   Hyperlipidemia    Osteopenia    hx fx 5th MT R foot 03/2013   Plantar fasciitis of left foot    Per [facility] new patient packet   Right rotator cuff tendonitis 2013   resolved with PT (injections not helpful)   Urine incontinence    Vitamin D deficiency    Past Surgical History:  Procedure Laterality Date   APPENDECTOMY  1957   BLEPHAROPLASTY     Per [facility] new patient packet   COLONOSCOPY     EYE SURGERY Bilateral 03/09/15, 03/23/15   cataract removal   TONSILLECTOMY  1940's   Patient Active Problem List   Diagnosis Date Noted   Hardening of the aorta (main artery of the heart) (HCC) 06/14/2023   Thoracic aortic aneurysm (HCC) 07/27/2022   Elevated blood pressure reading without diagnosis of hypertension 11/13/2021   Right hand pain 11/13/2021   Impingement syndrome of shoulder region 10/25/2021   AF (amaurosis fugax) 07/09/2021   Drug-induced skin rash 04/23/2021   Cyst of joint of hand, right 01/26/2021   Thinning hair 10/12/2020   Murmur, cardiac 10/12/2020   Traumatic  incomplete tear of right rotator cuff 10/12/2020   Urge incontinence 10/12/2020   Routine general medical examination at a health care facility 01/09/2015   Chronic toe pain, right foot 09/03/2014   Cyst of pineal gland    Vitamin D deficiency    Osteoporosis    Hx of type B viral hepatitis    Hyperlipidemia     PCP: Jackelyn Hoehn MD  REFERRING PROVIDER: Jackelyn Hoehn MD  REFERRING DIAG: M54.50 acute right sided low back pain without sciatica  Rationale for Evaluation and Treatment: Rehabilitation  THERAPY DIAG:  Back pain; weakness ONSET DATE: 3 months  SUBJECTIVE:  SUBJECTIVE STATEMENT: I'm feeling better. Not totally gone, but better.   Intake history from 11/06/22:  Symptoms began 2-3 months ago for no apparent reason.  Pain in bil low back, right SI region, right posterior thigh (upper to mid way) Does upper body weights at home, doesn't do lower body per se Had previous pelvic floor PT at this facility Had acupuncture (helpful)  PERTINENT HISTORY:  Osteoporosis; thoracic aortic aneurysm; bil foot neuropathy (numbness); right rotator cuff tear  PAIN:   Are you having pain? Yes NPRS scale: 1-2/10 Pain location: central LBP, right SI region, right posterior thigh upper to midway Pain orientation: Right  PAIN TYPE: aching Pain description: intermittent  Aggravating factors: as the day goes on, sitting on a hard chair, sitting with legs crossed, sometimes walking; sidelying with legs bent Relieving factors: Tylenol, AM; PRECAUTIONS: None  WEIGHT BEARING RESTRICTIONS: No  FALLS:  Has patient fallen in last 6 months? No LIVING ENVIRONMENT: Lives with: lives alone Lives in: House/apartment OCCUPATION: retired  PLOF: Independent  PATIENT GOALS: get rid of this  discomfort, be able to walk without pain;  (sitting) do art work, sit for reading , cooking  NEXT MD VISIT: as needed   OBJECTIVE:  Note: Objective measures were completed at Evaluation unless otherwise noted.  DIAGNOSTIC FINDINGS:  2022: Results:   Lumbar spine L1-L4 Femoral neck (FN) 33% distal radius  T-score -2.6 RFN: -1.6 LFN: -2.0 n/a  Change in BMD from previous DXA test (%) Down 0.7% Down 2.0% n/a  (*) statistically significant  PATIENT SURVEYS:  Modified Oswestry 10%   COGNITION: Overall cognitive status: Within functional limits for tasks assessed     MUSCLE LENGTH:  neural tension right with slump test and SLR;  decreased right hip flexor length  POSTURE: decreased lumbar lordosis  PALPATION: Tender points in gluteals and piriformis on right; minimal tenderness ischial tuberosity and proximal HS  LUMBAR ROM:   AROM eval  Flexion WFLs  Extension 15   Right lateral flexion 20  Left lateral flexion 20  Right rotation   Left rotation    (Blank rows = not tested)  TRUNK STRENGTH:  Decreased activation of transverse abdominus muscles; abdominals 4-/5; decreased activation of lumbar multifidi; trunk extensors 4-/5   LOWER EXTREMITY ROM:   WFLs   LOWER EXTREMITY MMT:  decreased right hip abduction 4-/5; left SLS 8 sec with ease, right SLS 5 sec with pelvic drop and inc difficulty stabilizing  LUMBAR SPECIAL TESTS:  + neural tension on right with slump test and SLR  FUNCTIONAL TESTS:  5x STS no Ues 10.3 sec  GAIT: Comments:   TREATMENT DATE:   11/16/23 Sit to stand 10x Standing hip extension isometric 10x 5 sec hold right/left Standing between table and wall hip abduction isometric rolled sheet on wall 10x 5 sec hold right/left Supine neural sciatic floss 10x  Bridge10x cues to bring feet closer to bottom to avoid HS cramp Supine march with TA contraction Sequential march with TA contraction - more challenging Supine TA contraction with SLR x 5  B Manual: Trigger Point Dry Needling  Subsequent Treatment: Instructions provided previously at initial dry needling treatment.   Patient Verbal Consent Given: Yes Education Handout Provided: Previously Provided Muscles Treated: R gluteals, piriformis and lateral quads Electrical Stimulation Performed: No Treatment Response/Outcome: Utilized skilled palpation to identify bony landmarks and trigger points.  Able to illicit twitch response and muscle elongation.  Soft tissue mobilization to R gluteals, piriformis, lateral quads, and R lumbar following  DN to further promote tissue elongation and decreased pain.   Also did TPR to R QL.  MHP x 10 min to R hip and leg post treatment to encourage increased blood flow to the area.  11/14/23: Self care: Discussed limiting sitting and using lumbar roll to avoid sacral sitting and pressure on irritated tissues used pelvic model for visual; discussed limiting overstretching piriformis if pain increases to a 5/10 or pain persists for a while after ex Sit to stand 8x (no pain) Seated self resisted hip abduction 5 sec hold 8x Standing in front of the wall, holding to back of the chair with hip extension isometric 5x 5 sec hold right/left (added to HEP) Standing between chair and wall hip abduction isometric  ball on wall 5x 5 sec hold right/left (added to HEP pt to use pillow at home) Supine neural sciatic floss 8x (some pain) Bridge 8x (HS cramp) Supine hand to knee isometric push same side 8x 5 sec hold (added to HEP)  11/10/23: Attempted NuStep but just getting into position caused Rt hip pain so d/c'd Supine SKTC Rt 5x5" Piriformis stretch 2x20" Rt only Supine sciatic flossing Rt 2x10 knee extensions Supine LTR A/ROM x 8 reps back and forth (good stretch no pain in Rt hip) Sit to stand from low mat table - needed cues to use hip hinge, no pain x10 reps Rt modified thomas test stretch off side of table x60" - good stretch Supine march with emphasis  on TA engagement especially with lowering phase of march x 2' alt LE Trigger Point Dry Needling  Initial Treatment: Pt instructed on Dry Needling rational, procedures, and possible side effects. Pt instructed to expect mild to moderate muscle soreness later in the day and/or into the next day.  Pt instructed in methods to reduce muscle soreness. Patient verbalized understanding of these instructions and education.   Patient Verbal Consent Given: Yes Education Handout Provided: Yes Muscles Treated: Rt glut max, glut med, glut min, piriformis - marinating technique Electrical Stimulation Performed: No Treatment Response/Outcome: release of tone with improved muscle extensibility and tenderness to palpation  Moist heat Rt hip x 8'  11/07/23                                                                                                                              Self care: limit sitting for prolonged periods of time, use lumbar roll (feels good), feet resting on floor, limit sitting cross legged Avoid overstretching piriformis/sciatic tissues Initial HEP   PATIENT EDUCATION:  Education details: Educated patient on anatomy and physiology of current symptoms, prognosis, plan of care as well as initial self care strategies to promote recovery Person educated: Patient Education method: Explanation Education comprehension: verbalized understanding  HOME EXERCISE PROGRAM: Access Code: [email or web address] URL: [email or web address] Date: 11/14/2023 Prepared by: Lavinia Sharps  Exercises - Clamshell with Resistance  - 1 x daily - 7 x weekly - 1 sets - 5 hold -  Supine Bridge with Resistance Band  - 1 x daily - 7 x weekly - 1 sets - 7-10 reps - Supine March  - 1 x daily - 7 x weekly - 1 sets - 7-10 reps - Sit to Stand  - 1 x daily - 7 x weekly - 2 sets - 5 reps - Hooklying Single Knee to Chest Stretch  - 1 x daily - 7 x weekly - 1 sets - 10 reps - 5 hold - Supine Piriformis Stretch with  Foot on Ground  - 1 x daily - 7 x weekly - 1 sets - 2 reps - 30 hold - Supine Sciatic Nerve Glide  - 1 x daily - 7 x weekly - 2 sets - 10 reps - Supine Lower Trunk Rotation  - 1 x daily - 7 x weekly - 1 sets - 10 reps - Modified Thomas Stretch  - 1 x daily - 7 x weekly - 1 sets - 2 reps - 30 hold - Supine Posterior Pelvic Tilt  - 1 x daily - 7 x weekly - 1 sets - 10 reps - wall isometrics  - 1 x daily - 7 x weekly - 2 sets - 5 reps - 5 hold - Standing Isometric Hip Abduction with Ball on Wall  - 1 x daily - 7 x weekly - 2 sets - 5 reps - 5 hold - Hooklying Isometric Hip Flexion with Opposite Arm  - 1 x daily - 7 x weekly - 2 sets - 5 reps - 5 hold Will HOLD red band exercises for now - were too painful.    ASSESSMENT:  CLINICAL IMPRESSION: Terry Edwards was able to tolerate  most exercises today without increased sx. She continues to feel nerve tension with supine nerve glide, but demonstrated increased mobility by 10th rep. Encouraged patient to return to nerve glide as tolerated at home and increase reps as well. Sequential march was challenging to pt's lower abdominals, but she understands the goal of a neutral spine and so HEP progressed with this and SLR. Marked twitch responses to DN in gluteals and lateral quads which were addressed as patient reported pain here intermittently. Terry Edwards continues to demonstrate potential for improvement and would benefit from continued skilled therapy to address impairments.    OBJECTIVE IMPAIRMENTS: decreased activity tolerance, difficulty walking, decreased strength, increased fascial restrictions, impaired perceived functional ability, and pain.   ACTIVITY LIMITATIONS: sitting, sleeping, and locomotion level  PARTICIPATION LIMITATIONS: meal prep, cleaning, laundry, driving, and community activity  PERSONAL FACTORS: 1-2 comorbidities: osteoporosis, bil neuropathy in feet  are also affecting patient's functional outcome.   REHAB POTENTIAL: Good  CLINICAL  DECISION MAKING: Stable/uncomplicated  EVALUATION COMPLEXITY: Low   GOALS: Goals reviewed with patient? Yes  SHORT TERM GOALS: Target date: 12/05/2023    The patient will demonstrate knowledge of basic self care strategies and exercises to promote healing  Baseline: Goal status: ongoing  2.  The patient will report a 40% improvement in pain levels with functional activities which are currently difficult including walking, sitting Baseline:  Goal status: INITIAL  3.  The patient will have improved hip strength to at least 4/5 needed for standing, walking longer distances and descending stairs at home and in the community  Baseline:  Goal status: INITIAL    LONG TERM GOALS: Target date: 01/02/2024   The patient will be independent in a safe self progression of a home exercise program to promote further recovery of function   Baseline:  Goal  status: INITIAL  2.  The patient will report a 75% improvement in pain levels with functional activities which are currently difficult including walking, sitting, as the day goes on Baseline:  Goal status: INITIAL  3.  The patient will have improved hip strength to at least 4+/5 needed for standing, walking longer distances and descending stairs at home and in the community  Baseline:  Goal status: INITIAL  4.  Able to walk 2.5 miles with pain level 2/10 Baseline:  Goal status: INITIAL  5.  Modified Oswestry score improved to 7% Baseline:  Goal status: INITIAL   PLAN:  PT FREQUENCY: 2x/week  PT DURATION: 8 weeks  PLANNED INTERVENTIONS: 97164- PT Re-evaluation, 97110-Therapeutic exercises, 97530- Therapeutic activity, 97112- Neuromuscular re-education, 97535- Self Care, 40981- Manual therapy, [id number]- Aquatic Therapy, 97014- Electrical stimulation (unattended), [id number]- Electrical stimulation (manual), [id number]- Vasopneumatic device, [id number]- Ultrasound, [id number]- Traction (mechanical), [id number]- Ionotophoresis 4mg /ml Dexamethasone,  Patient/Family education, Balance training, Stair training, Taping, Dry Needling, Joint mobilization, Spinal mobilization, Cryotherapy, and Moist heat.  PLAN FOR NEXT SESSION:  Assess response to DN;  progress core, dead bug, try Rt LE 2" lateral step up,  seated abs) and Rt gluteal strength (has + Trendelenburg and hip abd/ext weakness)   Solon Palm, PT  11/16/23 12:08 PM Phone: [phone] Fax: [phone]  Phone: [phone] Fax: [phone]

## 2023-11-16 ENCOUNTER — Encounter: Payer: Self-pay | Admitting: Physical Therapy

## 2023-11-16 ENCOUNTER — Ambulatory Visit: Payer: Medicare HMO | Admitting: Physical Therapy

## 2023-11-16 DIAGNOSIS — M6281 Muscle weakness (generalized): Secondary | ICD-10-CM | POA: Diagnosis not present

## 2023-11-16 DIAGNOSIS — R293 Abnormal posture: Secondary | ICD-10-CM

## 2023-11-16 DIAGNOSIS — M545 Low back pain, unspecified: Secondary | ICD-10-CM | POA: Diagnosis not present

## 2023-11-16 DIAGNOSIS — R279 Unspecified lack of coordination: Secondary | ICD-10-CM

## 2023-11-24 ENCOUNTER — Ambulatory Visit: Payer: Medicare HMO | Admitting: Physical Therapy

## 2023-11-24 ENCOUNTER — Encounter: Payer: Self-pay | Admitting: Physical Therapy

## 2023-11-24 DIAGNOSIS — M6281 Muscle weakness (generalized): Secondary | ICD-10-CM | POA: Diagnosis not present

## 2023-11-24 DIAGNOSIS — R293 Abnormal posture: Secondary | ICD-10-CM

## 2023-11-24 DIAGNOSIS — M545 Low back pain, unspecified: Secondary | ICD-10-CM | POA: Diagnosis not present

## 2023-11-24 DIAGNOSIS — R279 Unspecified lack of coordination: Secondary | ICD-10-CM

## 2023-11-24 NOTE — Therapy (Signed)
 OUTPATIENT PHYSICAL THERAPY THORACOLUMBAR TREATMENT   Patient Name: Terry Edwards MRN: [id number] DOB:07-29-1936, 88 y.o., female Today's Date: 11/24/2023  END OF SESSION:  PT End of Session - 11/24/23 1103     Visit Number 5    Date for PT Re-Evaluation 01/02/24    Authorization Type [employer]    Progress Note Due on Visit 10    PT Start Time 1103    PT Stop Time 1145    PT Time Calculation (min) 42 min    Activity Tolerance Patient tolerated treatment well    Behavior During Therapy WFL for tasks assessed/performed                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   Cataract    removed bilateral eyes   Chronic hepatitis B without hepatic coma (HCC) 1970s   chronic elev AFP   Cyst of pineal gland    incidental on MRI   Heart murmur    Hemorrhoids    History of chicken pox    History of depression 2006   situational, on Lexapro for 1 year   Hyperlipidemia    Osteopenia    hx fx 5th MT R foot 03/2013   Plantar fasciitis of left foot    Per [facility] new patient packet   Right rotator cuff tendonitis 2013   resolved with PT (injections not helpful)   Urine incontinence    Vitamin D deficiency    Past Surgical History:  Procedure Laterality Date   APPENDECTOMY  1957   BLEPHAROPLASTY     Per [facility] new patient packet   COLONOSCOPY     EYE SURGERY Bilateral 03/09/15, 03/23/15   cataract removal   TONSILLECTOMY  1940's   Patient Active Problem List   Diagnosis Date Noted   Hardening of the aorta (main artery of the heart) (HCC) 06/14/2023   Thoracic aortic aneurysm (HCC) 07/27/2022   Elevated blood pressure reading without diagnosis of hypertension 11/13/2021   Right hand pain 11/13/2021   Impingement syndrome of shoulder region 10/25/2021   AF (amaurosis fugax) 07/09/2021   Drug-induced skin rash 04/23/2021   Cyst of joint of hand,  right 01/26/2021   Thinning hair 10/12/2020   Murmur, cardiac 10/12/2020   Traumatic incomplete tear of right rotator cuff 10/12/2020   Urge incontinence 10/12/2020   Routine general medical examination at a health care facility 01/09/2015   Chronic toe pain, right foot 09/03/2014   Cyst of pineal gland    Vitamin D deficiency    Osteoporosis    Hx of type B viral hepatitis    Hyperlipidemia     PCP: Jackelyn Hoehn MD  REFERRING PROVIDER: Jackelyn Hoehn MD  REFERRING DIAG: M54.50 acute right sided low back pain without sciatica  Rationale for Evaluation and Treatment: Rehabilitation  THERAPY DIAG:  Back pain; weakness ONSET DATE: 3 months  SUBJECTIVE:  SUBJECTIVE STATEMENT:  Very sore after DN, especially in the leg. The pain has moved to more in the R lateral hip versus the buttock area.   Intake history from 11/06/22:  Symptoms began 2-3 months ago for no apparent reason.  Pain in bil low back, right SI region, right posterior thigh (upper to mid way) Does upper body weights at home, doesn't do lower body per se Had previous pelvic floor PT at this facility Had acupuncture (helpful)  PERTINENT HISTORY:  Osteoporosis; thoracic aortic aneurysm; bil foot neuropathy (numbness); right rotator cuff tear  PAIN:   Are you having pain? Yes NPRS scale: 5/10 Pain location: central LBP, right SI region, right posterior thigh upper to midway Pain orientation: Right  PAIN TYPE: aching Pain description: intermittent  Aggravating factors: as the day goes on, sitting on a hard chair, sitting with legs crossed, sometimes walking; sidelying with legs bent Relieving factors: Tylenol, AM; PRECAUTIONS: None  WEIGHT BEARING RESTRICTIONS: No  FALLS:  Has patient fallen in last 6 months?  No LIVING ENVIRONMENT: Lives with: lives alone Lives in: House/apartment OCCUPATION: retired  PLOF: Independent  PATIENT GOALS: get rid of this discomfort, be able to walk without pain;  (sitting) do art work, sit for reading , cooking  NEXT MD VISIT: as needed   OBJECTIVE:  Note: Objective measures were completed at Evaluation unless otherwise noted.  DIAGNOSTIC FINDINGS:  2022: Results:   Lumbar spine L1-L4 Femoral neck (FN) 33% distal radius  T-score -2.6 RFN: -1.6 LFN: -2.0 n/a  Change in BMD from previous DXA test (%) Down 0.7% Down 2.0% n/a  (*) statistically significant  PATIENT SURVEYS:  Modified Oswestry 10%   COGNITION: Overall cognitive status: Within functional limits for tasks assessed     MUSCLE LENGTH:  neural tension right with slump test and SLR;  decreased right hip flexor length  POSTURE: decreased lumbar lordosis  PALPATION: Tender points in gluteals and piriformis on right; minimal tenderness ischial tuberosity and proximal HS  LUMBAR ROM:   AROM eval  Flexion WFLs  Extension 15   Right lateral flexion 20  Left lateral flexion 20  Right rotation   Left rotation    (Blank rows = not tested)  TRUNK STRENGTH:  Decreased activation of transverse abdominus muscles; abdominals 4-/5; decreased activation of lumbar multifidi; trunk extensors 4-/5   LOWER EXTREMITY ROM:   WFLs   LOWER EXTREMITY MMT:  decreased right hip abduction 4-/5; left SLS 8 sec with ease, right SLS 5 sec with pelvic drop and inc difficulty stabilizing  LUMBAR SPECIAL TESTS:  + neural tension on right with slump test and SLR  FUNCTIONAL TESTS:  5x STS no Ues 10.3 sec  GAIT: Comments:   TREATMENT DATE:   11/24/23 Attempted hip flexor stretch on stairs  - WNL Pelvic press series; unilateral knee flex x 5 ea, B knee flex x 5 Prone hip ext 2x 5 B, then with pillow under waist 2x 5 B Prone hip IR/ER x 10 Prone upper trunk ext over pillow x 10 Manual: UPA mobs to  decrease muscle tension: B lumbar - more on right due to tightness Supine hip flexor stretch B 2x 30 sec with pelvic tilt Sequential march with TA contraction - pulling in back right away even with TA contraction Supine TA contraction with SLR x 5 B Attempted hip flexor strengthening supine and sidelying plank - too difficult Standing isometric hip flexion 5 sec hold 2x 10 Seated marching x 10 pulling in R hip  11/16/23 Sit to stand 10x Standing hip extension isometric 10x 5 sec hold right/left Standing between table and wall hip abduction isometric rolled sheet on wall 10x 5 sec hold right/left Supine neural sciatic floss 10x  Bridge10x cues to bring feet closer to bottom to avoid HS cramp Supine march with TA contraction Sequential march with TA contraction - more challenging Supine TA contraction with SLR x 5 B Manual: Trigger Point Dry Needling  Subsequent Treatment: Instructions provided previously at initial dry needling treatment.   Patient Verbal Consent Given: Yes Education Handout Provided: Previously Provided Muscles Treated: R gluteals, piriformis and lateral quads Electrical Stimulation Performed: No Treatment Response/Outcome: Utilized skilled palpation to identify bony landmarks and trigger points.  Able to illicit twitch response and muscle elongation.  Soft tissue mobilization to R gluteals, piriformis, lateral quads, and R lumbar following DN to further promote tissue elongation and decreased pain.   Also did TPR to R QL.  MHP x 10 min to R hip and leg post treatment to encourage increased blood flow to the area.  11/14/23: Self care: Discussed limiting sitting and using lumbar roll to avoid sacral sitting and pressure on irritated tissues used pelvic model for visual; discussed limiting overstretching piriformis if pain increases to a 5/10 or pain persists for a while after ex Sit to stand 8x (no pain) Seated self resisted hip abduction 5 sec hold 8x Standing in  front of the wall, holding to back of the chair with hip extension isometric 5x 5 sec hold right/left (added to HEP) Standing between chair and wall hip abduction isometric  ball on wall 5x 5 sec hold right/left (added to HEP pt to use pillow at home) Supine neural sciatic floss 8x (some pain) Bridge 8x (HS cramp) Supine hand to knee isometric push same side 8x 5 sec hold (added to HEP)  11/10/23: Attempted NuStep but just getting into position caused Rt hip pain so d/c'd Supine SKTC Rt 5x5" Piriformis stretch 2x20" Rt only Supine sciatic flossing Rt 2x10 knee extensions Supine LTR A/ROM x 8 reps back and forth (good stretch no pain in Rt hip) Sit to stand from low mat table - needed cues to use hip hinge, no pain x10 reps Rt modified thomas test stretch off side of table x60" - good stretch Supine march with emphasis on TA engagement especially with lowering phase of march x 2' alt LE Trigger Point Dry Needling  Initial Treatment: Pt instructed on Dry Needling rational, procedures, and possible side effects. Pt instructed to expect mild to moderate muscle soreness later in the day and/or into the next day.  Pt instructed in methods to reduce muscle soreness. Patient verbalized understanding of these instructions and education.   Patient Verbal Consent Given: Yes Education Handout Provided: Yes Muscles Treated: Rt glut max, glut med, glut min, piriformis - marinating technique Electrical Stimulation Performed: No Treatment Response/Outcome: release of tone with improved muscle extensibility and tenderness to palpation  Moist heat Rt hip x 8'  11/07/23  Self care: limit sitting for prolonged periods of time, use lumbar roll (feels good), feet resting on floor, limit sitting cross legged Avoid overstretching piriformis/sciatic tissues Initial HEP   PATIENT  EDUCATION:  Education details: Educated patient on anatomy and physiology of current symptoms, prognosis, plan of care as well as initial self care strategies to promote recovery Person educated: Patient Education method: Explanation Education comprehension: verbalized understanding  HOME EXERCISE PROGRAM: Access Code: [email or web address] URL: [email or web address] Date: 11/24/2023 Prepared by: Raynelle Fanning  Exercises - Clamshell with Resistance  - 1 x daily - 7 x weekly - 1 sets - 5 hold - Supine Bridge with Resistance Band  - 1 x daily - 7 x weekly - 1 sets - 7-10 reps - Supine March  - 1 x daily - 7 x weekly - 1 sets - 7-10 reps - Sit to Stand  - 1 x daily - 7 x weekly - 2 sets - 5 reps - Hooklying Single Knee to Chest Stretch  - 1 x daily - 7 x weekly - 1 sets - 10 reps - 5 hold - Supine Piriformis Stretch with Foot on Ground  - 1 x daily - 7 x weekly - 1 sets - 2 reps - 30 hold - Supine Sciatic Nerve Glide  - 1 x daily - 7 x weekly - 2 sets - 10 reps - Supine Lower Trunk Rotation  - 1 x daily - 7 x weekly - 1 sets - 10 reps - Modified Thomas Stretch  - 1 x daily - 7 x weekly - 1 sets - 2 reps - 30 hold - Supine Posterior Pelvic Tilt  - 1 x daily - 7 x weekly - 1 sets - 10 reps - wall isometrics  - 1 x daily - 7 x weekly - 2 sets - 5 reps - 5 hold - Standing Isometric Hip Abduction with Ball on Wall  - 1 x daily - 7 x weekly - 2 sets - 5 reps - 5 hold - Hooklying Isometric Hip Flexion with Opposite Arm  - 1 x daily - 7 x weekly - 2 sets - 5 reps - 5 hold - Supine Pelvic Tilt with Straight Leg Raise  - 1 x daily - 3 x weekly - 2 sets - 10 reps - Hooklying Sequential Leg March and Lower  - 1 x daily - 3 x weekly - 2 sets - 10 reps - Standing Isometric Hip Flexion with Ball at [employer] (Mirrored)  - 1 x daily - 7 x weekly - 2 sets - 10 reps - 5-10 sec hold - Prone Hip Extension with Pillow Under Abdomen  - 1 x daily - 3 x weekly - 2 sets - 10 reps Will HOLD red band exercises for now - were  too painful and sequential marching    ASSESSMENT:  CLINICAL IMPRESSION: [place] presents with less referred pain into the lateral thigh since last visit.  She has right hip flexor weakness, so we worked on strengthening here. She has pain with 90/90 position and with seated marching, so we went to isometrics which she tolerated well. She fatigued fairly quickly with these as well. We worked on lumbar strengthening as well and requires a pillow under her abdomen to avoid pain. Advised her to hold sequential march at this time as well as she cannot stabilize well enough to avoid pain. Pt continues to demonstrate potential for improvement and would benefit from continued skilled therapy to address impairments.    OBJECTIVE IMPAIRMENTS:  decreased activity tolerance, difficulty walking, decreased strength, increased fascial restrictions, impaired perceived functional ability, and pain.   ACTIVITY LIMITATIONS: sitting, sleeping, and locomotion level  PARTICIPATION LIMITATIONS: meal prep, cleaning, laundry, driving, and community activity  PERSONAL FACTORS: 1-2 comorbidities: osteoporosis, bil neuropathy in feet  are also affecting patient's functional outcome.   REHAB POTENTIAL: Good  CLINICAL DECISION MAKING: Stable/uncomplicated  EVALUATION COMPLEXITY: Low   GOALS: Goals reviewed with patient? Yes  SHORT TERM GOALS: Target date: 12/05/2023    The patient will demonstrate knowledge of basic self care strategies and exercises to promote healing  Baseline: Goal status: ongoing  2.  The patient will report a 40% improvement in pain levels with functional activities which are currently difficult including walking, sitting Baseline:  Goal status: INITIAL  3.  The patient will have improved hip strength to at least 4/5 needed for standing, walking longer distances and descending stairs at home and in the community  Baseline:  Goal status: INITIAL    LONG TERM GOALS: Target date:  01/02/2024   The patient will be independent in a safe self progression of a home exercise program to promote further recovery of function   Baseline:  Goal status: INITIAL  2.  The patient will report a 75% improvement in pain levels with functional activities which are currently difficult including walking, sitting, as the day goes on Baseline:  Goal status: INITIAL  3.  The patient will have improved hip strength to at least 4+/5 needed for standing, walking longer distances and descending stairs at home and in the community  Baseline:  Goal status: INITIAL  4.  Able to walk 2.5 miles with pain level 2/10 Baseline:  Goal status: INITIAL  5.  Modified Oswestry score improved to 7% Baseline:  Goal status: INITIAL   PLAN:  PT FREQUENCY: 2x/week  PT DURATION: 8 weeks  PLANNED INTERVENTIONS: 97164- PT Re-evaluation, 97110-Therapeutic exercises, 97530- Therapeutic activity, 97112- Neuromuscular re-education, 97535- Self Care, 09811- Manual therapy, [id number]- Aquatic Therapy, 97014- Electrical stimulation (unattended), [id number]- Electrical stimulation (manual), [id number]- Vasopneumatic device, [id number]- Ultrasound, [id number]- Traction (mechanical), [id number]- Ionotophoresis 4mg /ml Dexamethasone, Patient/Family education, Balance training, Stair training, Taping, Dry Needling, Joint mobilization, Spinal mobilization, Cryotherapy, and Moist heat.  PLAN FOR NEXT SESSION:  Assess response to hip flexor isometric and prone hip ext; continue DN as needed;  progress core and hip flexor strengthening, dead bug, try Rt LE 2" lateral step up,  seated abs and Rt gluteal strength (has + Trendelenburg and hip abd/ext weakness)   Solon Palm, PT  11/24/23 11:56 AM Phone: [phone] Fax: [phone]  Phone: [phone] Fax: [phone]

## 2023-11-28 ENCOUNTER — Ambulatory Visit: Payer: Medicare HMO | Admitting: Obstetrics and Gynecology

## 2023-11-28 ENCOUNTER — Encounter: Payer: Self-pay | Admitting: Obstetrics and Gynecology

## 2023-11-28 ENCOUNTER — Ambulatory Visit: Payer: Medicare HMO | Admitting: Physical Therapy

## 2023-11-28 VITALS — BP 131/78 | HR 84

## 2023-11-28 DIAGNOSIS — N3281 Overactive bladder: Secondary | ICD-10-CM | POA: Diagnosis not present

## 2023-11-28 DIAGNOSIS — M545 Low back pain, unspecified: Secondary | ICD-10-CM | POA: Diagnosis not present

## 2023-11-28 DIAGNOSIS — R350 Frequency of micturition: Secondary | ICD-10-CM

## 2023-11-28 DIAGNOSIS — R3915 Urgency of urination: Secondary | ICD-10-CM

## 2023-11-28 DIAGNOSIS — M6281 Muscle weakness (generalized): Secondary | ICD-10-CM

## 2023-11-28 NOTE — Progress Notes (Signed)
 [facility]  PTNS VISIT  CC:  Overactive bladder  88 y.o. with refractory overactive bladder who presents for percutaneous tibial nerve stimulation. The patient presents for PTNS Maintenance session # 3.  Patient was unable to be prescribed anticholinergics when we first started therapy due to age and risk of side effects. We looked into beta 3 agonists and they were estimated to be over 300$ per month which was not sustainable. Patient chose to do PTNS and has had a greater than 50% improvement. We have been doing maintenance sessions but she is considering [facility] due to the ability to do sessions more frequently and in her own home. I think, with her success of PTNS, this would be a good option for her.    Procedure: The patient was placed in the sitting position and the left lower extremity was prepped in the usual fashion. The PTNS needle was then inserted at a 60 degree angle, 5 cm cephalad and 2 cm posterior to the medial malleolus. The PTNS unit was then programmed and an optimal response was noted at 11 milliamps. The PTNS stimulation was then performed at this setting for 30 minutes without incident and the patient tolerated the procedure well. The needle was removed and hemostasis was noted.   The pt will return in 1 week for PTNS session # 4. All questions were answered.

## 2023-11-28 NOTE — Therapy (Signed)
 OUTPATIENT PHYSICAL THERAPY THORACOLUMBAR TREATMENT   Patient Name: Terry Edwards [place] MRN: [id number] DOB:[birth year], 88 y.o., female Today's Date: 11/28/2023  END OF SESSION:  PT End of Session - 11/28/23 1019     Visit Number 6    Date for PT Re-Evaluation 01/02/24    Authorization Type [employer]    Progress Note Due on Visit 10    PT Start Time 1015    PT Stop Time 1055    PT Time Calculation (min) 40 min    Activity Tolerance Patient tolerated treatment well                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   Cataract    removed bilateral eyes   Chronic hepatitis B without hepatic coma (HCC) 1970s   chronic elev AFP   Cyst of pineal gland    incidental on MRI   Heart murmur    Hemorrhoids    History of chicken pox    History of depression 2006   situational, on Lexapro for 1 year   Hyperlipidemia    Osteopenia    hx fx 5th MT R foot 03/2013   Plantar fasciitis of left foot    Per [facility] new patient packet   Right rotator cuff tendonitis 2013   resolved with PT (injections not helpful)   Urine incontinence    Vitamin D deficiency    Past Surgical History:  Procedure Laterality Date   APPENDECTOMY  1957   BLEPHAROPLASTY     Per [facility] new patient packet   COLONOSCOPY     EYE SURGERY Bilateral 03/09/15, 03/23/15   cataract removal   TONSILLECTOMY  1940's   Patient Active Problem List   Diagnosis Date Noted   Hardening of the aorta (main artery of the heart) (HCC) 06/14/2023   Thoracic aortic aneurysm (HCC) 07/27/2022   Elevated blood pressure reading without diagnosis of hypertension 11/13/2021   Right hand pain 11/13/2021   Impingement syndrome of shoulder region 10/25/2021   AF (amaurosis fugax) 07/09/2021   Drug-induced skin rash 04/23/2021   Cyst of joint of hand, right 01/26/2021   Thinning hair 10/12/2020   Murmur, cardiac  10/12/2020   Traumatic incomplete tear of right rotator cuff 10/12/2020   Urge incontinence 10/12/2020   Routine general medical examination at a health care facility 01/09/2015   Chronic toe pain, right foot 09/03/2014   Cyst of pineal gland    Vitamin D deficiency    Osteoporosis    Hx of type B viral hepatitis    Hyperlipidemia     PCP: Jackelyn Hoehn MD  REFERRING PROVIDER: Jackelyn Hoehn MD  REFERRING DIAG: M54.50 acute right sided low back pain without sciatica  Rationale for Evaluation and Treatment: Rehabilitation  THERAPY DIAG:  Back pain; weakness ONSET DATE: 3 months  SUBJECTIVE:  SUBJECTIVE STATEMENT:  Sore after DN but I think it helps.  I think the new exercise (prone hip extension) might be aggravating things.  Intake history from 11/06/22:  Symptoms began 2-3 months ago for no apparent reason.  Pain in bil low back, right SI region, right posterior thigh (upper to mid way) Does upper body weights at home, doesn't do lower body per se Had previous pelvic floor PT at this facility Had acupuncture (helpful)  PERTINENT HISTORY:  Osteoporosis; thoracic aortic aneurysm; bil foot neuropathy (numbness); right rotator cuff tear  PAIN:   Are you having pain? Yes NPRS scale: 4/10 Pain location: right buttock, distal lateral HS, ITB, low back Pain orientation: Right  PAIN TYPE: aching Pain description: intermittent  Aggravating factors: as the day goes on, sitting on a hard chair, sitting with legs crossed, sometimes walking; sidelying with legs bent Relieving factors: Tylenol, AM; PRECAUTIONS: None  WEIGHT BEARING RESTRICTIONS: No  FALLS:  Has patient fallen in last 6 months? No LIVING ENVIRONMENT: Lives with: lives alone Lives in: House/apartment OCCUPATION:  retired  PLOF: Independent  PATIENT GOALS: get rid of this discomfort, be able to walk without pain;  (sitting) do art work, sit for reading , cooking  NEXT MD VISIT: as needed   OBJECTIVE:  Note: Objective measures were completed at Evaluation unless otherwise noted.  DIAGNOSTIC FINDINGS:  2022: Results:   Lumbar spine L1-L4 Femoral neck (FN) 33% distal radius  T-score -2.6 RFN: -1.6 LFN: -2.0 n/a  Change in BMD from previous DXA test (%) Down 0.7% Down 2.0% n/a  (*) statistically significant  PATIENT SURVEYS:  Modified Oswestry 10%   COGNITION: Overall cognitive status: Within functional limits for tasks assessed     MUSCLE LENGTH:  neural tension right with slump test and SLR;  decreased right hip flexor length  POSTURE: decreased lumbar lordosis  PALPATION: Tender points in gluteals and piriformis on right; minimal tenderness ischial tuberosity and proximal HS  LUMBAR ROM:   AROM eval  Flexion WFLs  Extension 15   Right lateral flexion 20  Left lateral flexion 20  Right rotation   Left rotation    (Blank rows = not tested)  TRUNK STRENGTH:  Decreased activation of transverse abdominus muscles; abdominals 4-/5; decreased activation of lumbar multifidi; trunk extensors 4-/5   LOWER EXTREMITY ROM:   WFLs   LOWER EXTREMITY MMT:  decreased right hip abduction 4-/5; left SLS 8 sec with ease, right SLS 5 sec with pelvic drop and inc difficulty stabilizing  LUMBAR SPECIAL TESTS:  + neural tension on right with slump test and SLR  FUNCTIONAL TESTS:  5x STS no Ues 10.3 sec  GAIT: Comments:   TREATMENT DATE:  11/28/23 Hold on prone hip extension for now Prone gluteal squeezes 3 sec hold 10x(added to HEP) SLS at the counter 5 sec hold 10x (added to HEP) WB on right with left 3 ways 5x (added to HEP) Manual therapy: soft tissue mobilization to right gluteals, right lumbar, right ITB, right lateral HS Trigger Point Dry Needling Subsequent Treatment:  Instructions provided previously at initial dry needling treatment.  Patient Verbal Consent Given: Yes Education Handout Provided: Previously Provided Muscles Treated: prone right gluteals, right lumbar multifidi, right ITB, right lateral HS Electrical Stimulation Performed: yes; 80 pps, 1.5 ma 8 min to muscles above Treatment Response/Outcome: Utilized skilled palpation to identify bony landmarks and trigger points.  Able to illicit twitch response and muscle elongation.    11/24/23 Attempted hip flexor stretch on stairs  -  WNL Pelvic press series; unilateral knee flex x 5 ea, B knee flex x 5 Prone hip ext 2x 5 B, then with pillow under waist 2x 5 B Prone hip IR/ER x 10 Prone upper trunk ext over pillow x 10 Manual: UPA mobs to decrease muscle tension: B lumbar - more on right due to tightness Supine hip flexor stretch B 2x 30 sec with pelvic tilt Sequential march with TA contraction - pulling in back right away even with TA contraction Supine TA contraction with SLR x 5 B Attempted hip flexor strengthening supine and sidelying plank - too difficult Standing isometric hip flexion 5 sec hold 2x 10 Seated marching x 10 pulling in R hip   11/16/23 Sit to stand 10x Standing hip extension isometric 10x 5 sec hold right/left Standing between table and wall hip abduction isometric rolled sheet on wall 10x 5 sec hold right/left Supine neural sciatic floss 10x  Bridge10x cues to bring feet closer to bottom to avoid HS cramp Supine march with TA contraction Sequential march with TA contraction - more challenging Supine TA contraction with SLR x 5 B Manual: Trigger Point Dry Needling  Subsequent Treatment: Instructions provided previously at initial dry needling treatment.   Patient Verbal Consent Given: Yes Education Handout Provided: Previously Provided Muscles Treated: R gluteals, piriformis and lateral quads Electrical Stimulation Performed: No Treatment Response/Outcome: Utilized  skilled palpation to identify bony landmarks and trigger points.  Able to illicit twitch response and muscle elongation.  Soft tissue mobilization to R gluteals, piriformis, lateral quads, and R lumbar following DN to further promote tissue elongation and decreased pain.   Also did TPR to R QL.  MHP x 10 min to R hip and leg post treatment to encourage increased blood flow to the area.  11/14/23: Self care: Discussed limiting sitting and using lumbar roll to avoid sacral sitting and pressure on irritated tissues used pelvic model for visual; discussed limiting overstretching piriformis if pain increases to a 5/10 or pain persists for a while after ex Sit to stand 8x (no pain) Seated self resisted hip abduction 5 sec hold 8x Standing in front of the wall, holding to back of the chair with hip extension isometric 5x 5 sec hold right/left (added to HEP) Standing between chair and wall hip abduction isometric  ball on wall 5x 5 sec hold right/left (added to HEP pt to use pillow at home) Supine neural sciatic floss 8x (some pain) Bridge 8x (HS cramp) Supine hand to knee isometric push same side 8x 5 sec hold (added to HEP)  PATIENT EDUCATION:  Education details: Educated patient on anatomy and physiology of current symptoms, prognosis, plan of care as well as initial self care strategies to promote recovery Person educated: Patient Education method: Explanation Education comprehension: verbalized understanding  HOME EXERCISE PROGRAM: Access Code: [email or web address] URL: [email or web address] Date: 11/28/2023 Prepared by: Lavinia Sharps  Exercises - Clamshell with Resistance  - 1 x daily - 7 x weekly - 1 sets - 5 hold - Supine Bridge with Resistance Band  - 1 x daily - 7 x weekly - 1 sets - 7-10 reps - Supine March  - 1 x daily - 7 x weekly - 1 sets - 7-10 reps - Sit to Stand  - 1 x daily - 7 x weekly - 2 sets - 5 reps - Hooklying Single Knee to Chest Stretch  - 1 x daily - 7 x weekly - 1  sets - 10 reps - 5 hold - Supine  Piriformis Stretch with Foot on Ground  - 1 x daily - 7 x weekly - 1 sets - 2 reps - 30 hold - Supine Sciatic Nerve Glide  - 1 x daily - 7 x weekly - 2 sets - 10 reps - Supine Lower Trunk Rotation  - 1 x daily - 7 x weekly - 1 sets - 10 reps - Modified Thomas Stretch  - 1 x daily - 7 x weekly - 1 sets - 2 reps - 30 hold - Supine Posterior Pelvic Tilt  - 1 x daily - 7 x weekly - 1 sets - 10 reps - wall isometrics  - 1 x daily - 7 x weekly - 2 sets - 5 reps - 5 hold - Standing Isometric Hip Abduction with Ball on Wall  - 1 x daily - 7 x weekly - 2 sets - 5 reps - 5 hold - Hooklying Isometric Hip Flexion with Opposite Arm  - 1 x daily - 7 x weekly - 2 sets - 5 reps - 5 hold - Supine Pelvic Tilt with Straight Leg Raise  - 1 x daily - 3 x weekly - 2 sets - 10 reps - Hooklying Sequential Leg March and Lower  - 1 x daily - 3 x weekly - 2 sets - 10 reps - Standing Isometric Hip Flexion with Ball at [employer] (Mirrored)  - 1 x daily - 7 x weekly - 2 sets - 10 reps - 5-10 sec hold - Prone Hip Extension with Pillow Under Abdomen  - 1 x daily - 3 x weekly - 2 sets - 10 reps - Prone Gluteal Sets  - 1 x daily - 7 x weekly - 10 reps - 3 hold - Standing Single Leg Stance with Counter Support  - 1 x daily - 7 x weekly - 1 sets - 10 reps - 5 hold - Standing 4-Way Leg Reach with Counter Support  - 1 x daily - 7 x weekly - 1 sets - 5 reps  ASSESSMENT:  CLINICAL IMPRESSION: Modified exercises secondary to symptom irritability with emphasis on isometrics in prone and standing. Electrical stimulation added to indwelling needles for further impacts on pain relief and neuromuscular changes with a positive initial response.  Therapist monitoring response to all interventions and modifying treatment accordingly.    OBJECTIVE IMPAIRMENTS: decreased activity tolerance, difficulty walking, decreased strength, increased fascial restrictions, impaired perceived functional ability, and pain.    ACTIVITY LIMITATIONS: sitting, sleeping, and locomotion level  PARTICIPATION LIMITATIONS: meal prep, cleaning, laundry, driving, and community activity  PERSONAL FACTORS: 1-2 comorbidities: osteoporosis, bil neuropathy in feet  are also affecting patient's functional outcome.   REHAB POTENTIAL: Good  CLINICAL DECISION MAKING: Stable/uncomplicated  EVALUATION COMPLEXITY: Low   GOALS: Goals reviewed with patient? Yes  SHORT TERM GOALS: Target date: 12/05/2023    The patient will demonstrate knowledge of basic self care strategies and exercises to promote healing  Baseline: Goal status: ongoing  2.  The patient will report a 40% improvement in pain levels with functional activities which are currently difficult including walking, sitting Baseline:  Goal status: INITIAL  3.  The patient will have improved hip strength to at least 4/5 needed for standing, walking longer distances and descending stairs at home and in the community  Baseline:  Goal status: INITIAL    LONG TERM GOALS: Target date: 01/02/2024   The patient will be independent in a safe self progression of a home exercise program to promote further recovery of  function   Baseline:  Goal status: INITIAL  2.  The patient will report a 75% improvement in pain levels with functional activities which are currently difficult including walking, sitting, as the day goes on Baseline:  Goal status: INITIAL  3.  The patient will have improved hip strength to at least 4+/5 needed for standing, walking longer distances and descending stairs at home and in the community  Baseline:  Goal status: INITIAL  4.  Able to walk 2.5 miles with pain level 2/10 Baseline:  Goal status: INITIAL  5.  Modified Oswestry score improved to 7% Baseline:  Goal status: INITIAL   PLAN:  PT FREQUENCY: 2x/week  PT DURATION: 8 weeks  PLANNED INTERVENTIONS: 97164- PT Re-evaluation, 97110-Therapeutic exercises, 97530- Therapeutic  activity, 97112- Neuromuscular re-education, 97535- Self Care, 56213- Manual therapy, [id number]- Aquatic Therapy, 97014- Electrical stimulation (unattended), [id number]- Electrical stimulation (manual), [id number]- Vasopneumatic device, [id number]- Ultrasound, [id number]- Traction (mechanical), [id number]- Ionotophoresis 4mg /ml Dexamethasone, Patient/Family education, Balance training, Stair training, Taping, Dry Needling, Joint mobilization, Spinal mobilization, Cryotherapy, and Moist heat.  PLAN FOR NEXT SESSION:  Assess response to DN with ES;  try Rt LE 2" lateral step up,  seated abs and Rt gluteal strength (has + Trendelenburg and hip abd/ext weakness)  Lavinia Sharps, PT 11/28/23 2:07 PM Phone: [phone] Fax: [phone]

## 2023-11-30 ENCOUNTER — Ambulatory Visit: Payer: Medicare HMO | Admitting: Physical Therapy

## 2023-11-30 DIAGNOSIS — M545 Low back pain, unspecified: Secondary | ICD-10-CM

## 2023-11-30 DIAGNOSIS — M6281 Muscle weakness (generalized): Secondary | ICD-10-CM | POA: Diagnosis not present

## 2023-11-30 NOTE — Therapy (Signed)
 OUTPATIENT PHYSICAL THERAPY THORACOLUMBAR TREATMENT   Patient Name: Terry Edwards MRN: [id number] DOB:21-Mar-1936, 88 y.o., female Today's Date: 11/30/2023  END OF SESSION:  PT End of Session - 11/30/23 1143     Visit Number 7    Date for PT Re-Evaluation 01/02/24    Authorization Type [employer]    Progress Note Due on Visit 10    PT Start Time 1145    PT Stop Time 1226    PT Time Calculation (min) 41 min    Activity Tolerance Patient tolerated treatment well                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   Cataract    removed bilateral eyes   Chronic hepatitis B without hepatic coma (HCC) 1970s   chronic elev AFP   Cyst of pineal gland    incidental on MRI   Heart murmur    Hemorrhoids    History of chicken pox    History of depression 2006   situational, on Lexapro for 1 year   Hyperlipidemia    Osteopenia    hx fx 5th MT R foot 03/2013   Plantar fasciitis of left foot    Per [facility] new patient packet   Right rotator cuff tendonitis 2013   resolved with PT (injections not helpful)   Urine incontinence    Vitamin D deficiency    Past Surgical History:  Procedure Laterality Date   APPENDECTOMY  1957   BLEPHAROPLASTY     Per [facility] new patient packet   COLONOSCOPY     EYE SURGERY Bilateral 03/09/15, 03/23/15   cataract removal   TONSILLECTOMY  1940's   Patient Active Problem List   Diagnosis Date Noted   Hardening of the aorta (main artery of the heart) (HCC) 06/14/2023   Thoracic aortic aneurysm (HCC) 07/27/2022   Elevated blood pressure reading without diagnosis of hypertension 11/13/2021   Right hand pain 11/13/2021   Impingement syndrome of shoulder region 10/25/2021   AF (amaurosis fugax) 07/09/2021   Drug-induced skin rash 04/23/2021   Cyst of joint of hand, right 01/26/2021   Thinning hair 10/12/2020   Murmur, cardiac  10/12/2020   Traumatic incomplete tear of right rotator cuff 10/12/2020   Urge incontinence 10/12/2020   Routine general medical examination at a health care facility 01/09/2015   Chronic toe pain, right foot 09/03/2014   Cyst of pineal gland    Vitamin D deficiency    Osteoporosis    Hx of type B viral hepatitis    Hyperlipidemia     PCP: Jackelyn Hoehn MD  REFERRING PROVIDER: Jackelyn Hoehn MD  REFERRING DIAG: M54.50 acute right sided low back pain without sciatica  Rationale for Evaluation and Treatment: Rehabilitation  THERAPY DIAG:  Back pain; weakness ONSET DATE: 3 months  SUBJECTIVE:  SUBJECTIVE STATEMENT: Achy but I think it's better.   Intake history from 11/06/22:  Symptoms began 2-3 months ago for no apparent reason.  Pain in bil low back, right SI region, right posterior thigh (upper to mid way) Does upper body weights at home, doesn't do lower body per se Had previous pelvic floor PT at this facility Had acupuncture (helpful)  PERTINENT HISTORY:  Osteoporosis; thoracic aortic aneurysm; bil foot neuropathy (numbness); right rotator cuff tear  PAIN:   Are you having pain? Yes NPRS scale: 3/10 Pain location: right buttock, distal lateral HS, ITB, low back Pain orientation: Right  PAIN TYPE: aching Pain description: intermittent  Aggravating factors: as the day goes on, sitting on a hard chair, sitting with legs crossed, sometimes walking; sidelying with legs bent Relieving factors: Tylenol, AM; PRECAUTIONS: None  WEIGHT BEARING RESTRICTIONS: No  FALLS:  Has patient fallen in last 6 months? No LIVING ENVIRONMENT: Lives with: lives alone Lives in: House/apartment OCCUPATION: retired  PLOF: Independent  PATIENT GOALS: get rid of this discomfort, be able  to walk without pain;  (sitting) do art work, sit for reading , cooking  NEXT MD VISIT: as needed   OBJECTIVE:  Note: Objective measures were completed at Evaluation unless otherwise noted.  DIAGNOSTIC FINDINGS:  2022: Results:   Lumbar spine L1-L4 Femoral neck (FN) 33% distal radius  T-score -2.6 RFN: -1.6 LFN: -2.0 n/a  Change in BMD from previous DXA test (%) Down 0.7% Down 2.0% n/a  (*) statistically significant  PATIENT SURVEYS:  Modified Oswestry 10%   COGNITION: Overall cognitive status: Within functional limits for tasks assessed     MUSCLE LENGTH:  neural tension right with slump test and SLR;  decreased right hip flexor length  POSTURE: decreased lumbar lordosis  PALPATION: Tender points in gluteals and piriformis on right; minimal tenderness ischial tuberosity and proximal HS  LUMBAR ROM:   AROM eval  Flexion WFLs  Extension 15   Right lateral flexion 20  Left lateral flexion 20  Right rotation   Left rotation    (Blank rows = not tested)  TRUNK STRENGTH:  Decreased activation of transverse abdominus muscles; abdominals 4-/5; decreased activation of lumbar multifidi; trunk extensors 4-/5   LOWER EXTREMITY ROM:   WFLs   LOWER EXTREMITY MMT:  decreased right hip abduction 4-/5; left SLS 8 sec with ease, right SLS 5 sec with pelvic drop and inc difficulty stabilizing  LUMBAR SPECIAL TESTS:  + neural tension on right with slump test and SLR  FUNCTIONAL TESTS:  5x STS no Ues 10.3 sec  GAIT: Comments:   TREATMENT DATE:  11/30/23 Prone glute squeeze x 10 Supine double bridge 7x Single leg bridge with towel roll hold in bend of the hip 2 sets of 5 SLS with 1-2 finger support encouraged longer hold times > 5 sec Standing hip extension isometric into wall (progressed with foot off floor and single arm support) 5 sec hold 7x right/left Standing hip abduction ball on wall (feels more in quad instead of glute on right) 2 inch lateral step up/down 10x  right/left single hand support (added to HEP) better activation of glutes Single leg [place] dead lift with reach down to knee level stool 10x right/left 2 finger and 1 finger support (added to HeP)   11/28/23 Hold on prone hip extension for now Prone gluteal squeezes 3 sec hold 10x(added to HEP) SLS at the counter 5 sec hold 10x (added to HEP) WB on right with left 3 ways 5x (  added to HEP) Manual therapy: soft tissue mobilization to right gluteals, right lumbar, right ITB, right lateral HS Trigger Point Dry Needling Subsequent Treatment: Instructions provided previously at initial dry needling treatment.  Patient Verbal Consent Given: Yes Education Handout Provided: Previously Provided Muscles Treated: prone right gluteals, right lumbar multifidi, right ITB, right lateral HS Electrical Stimulation Performed: yes; 80 pps, 1.5 ma 8 min to muscles above Treatment Response/Outcome: Utilized skilled palpation to identify bony landmarks and trigger points.  Able to illicit twitch response and muscle elongation.    11/24/23 Attempted hip flexor stretch on stairs  - WNL Pelvic press series; unilateral knee flex x 5 ea, B knee flex x 5 Prone hip ext 2x 5 B, then with pillow under waist 2x 5 B Prone hip IR/ER x 10 Prone upper trunk ext over pillow x 10 Manual: UPA mobs to decrease muscle tension: B lumbar - more on right due to tightness Supine hip flexor stretch B 2x 30 sec with pelvic tilt Sequential march with TA contraction - pulling in back right away even with TA contraction Supine TA contraction with SLR x 5 B Attempted hip flexor strengthening supine and sidelying plank - too difficult Standing isometric hip flexion 5 sec hold 2x 10 Seated marching x 10 pulling in R hip   11/16/23 Sit to stand 10x Standing hip extension isometric 10x 5 sec hold right/left Standing between table and wall hip abduction isometric rolled sheet on wall 10x 5 sec hold right/left Supine neural sciatic  floss 10x  Bridge10x cues to bring feet closer to bottom to avoid HS cramp Supine march with TA contraction Sequential march with TA contraction - more challenging Supine TA contraction with SLR x 5 B Manual: Trigger Point Dry Needling  Subsequent Treatment: Instructions provided previously at initial dry needling treatment.   Patient Verbal Consent Given: Yes Education Handout Provided: Previously Provided Muscles Treated: R gluteals, piriformis and lateral quads Electrical Stimulation Performed: No Treatment Response/Outcome: Utilized skilled palpation to identify bony landmarks and trigger points.  Able to illicit twitch response and muscle elongation.  Soft tissue mobilization to R gluteals, piriformis, lateral quads, and R lumbar following DN to further promote tissue elongation and decreased pain.   Also did TPR to R QL.  MHP x 10 min to R hip and leg post treatment to encourage increased blood flow to the area.  PATIENT EDUCATION:  Education details: Educated patient on anatomy and physiology of current symptoms, prognosis, plan of care as well as initial self care strategies to promote recovery Person educated: Patient Education method: Explanation Education comprehension: verbalized understanding  HOME EXERCISE PROGRAM: Access Code: [email or web address] URL: [email or web address] Date: 11/30/2023 Prepared by: Lavinia Sharps  Exercises - Clamshell with Resistance  - 1 x daily - 7 x weekly - 1 sets - 5 hold - Supine Bridge with Resistance Band  - 1 x daily - 7 x weekly - 1 sets - 7-10 reps - Supine March  - 1 x daily - 7 x weekly - 1 sets - 7-10 reps - Sit to Stand  - 1 x daily - 7 x weekly - 2 sets - 5 reps - Hooklying Single Knee to Chest Stretch  - 1 x daily - 7 x weekly - 1 sets - 10 reps - 5 hold - Supine Piriformis Stretch with Foot on Ground  - 1 x daily - 7 x weekly - 1 sets - 2 reps - 30 hold - Supine Sciatic Nerve Glide  - 1 x  daily - 7 x weekly - 2 sets - 10  reps - Supine Lower Trunk Rotation  - 1 x daily - 7 x weekly - 1 sets - 10 reps - Modified Thomas Stretch  - 1 x daily - 7 x weekly - 1 sets - 2 reps - 30 hold - Supine Posterior Pelvic Tilt  - 1 x daily - 7 x weekly - 1 sets - 10 reps - wall isometrics  - 1 x daily - 7 x weekly - 2 sets - 5 reps - 5 hold - Standing Isometric Hip Abduction with Ball on Wall  - 1 x daily - 7 x weekly - 2 sets - 5 reps - 5 hold - Hooklying Isometric Hip Flexion with Opposite Arm  - 1 x daily - 7 x weekly - 2 sets - 5 reps - 5 hold - Supine Pelvic Tilt with Straight Leg Raise  - 1 x daily - 3 x weekly - 2 sets - 10 reps - Hooklying Sequential Leg March and Lower  - 1 x daily - 3 x weekly - 2 sets - 10 reps - Standing Isometric Hip Flexion with Ball at [employer] (Mirrored)  - 1 x daily - 7 x weekly - 2 sets - 10 reps - 5-10 sec hold - Prone Hip Extension with Pillow Under Abdomen  - 1 x daily - 3 x weekly - 2 sets - 10 reps - Prone Gluteal Sets  - 1 x daily - 7 x weekly - 10 reps - 3 hold - Standing Single Leg Stance with Counter Support  - 1 x daily - 7 x weekly - 1 sets - 10 reps - 5 hold - Standing 4-Way Leg Reach with Counter Support  - 1 x daily - 7 x weekly - 1 sets - 5 reps - Lateral Step Up with Counter Support  - 1 x daily - 7 x weekly - 1 sets - 10 reps - Forward T with Counter Support  - 1 x daily - 7 x weekly - 1 sets - 10 reps  ASSESSMENT:  CLINICAL IMPRESSION: Update and progression of standing ex's including lateral step ups/downs and single leg dead lifts to knee level needed for strengthening of gluteals. Verbal and tactile cues to optimize glute activation on right. Asymmetry noted b/w right and left.  No increase in pain but with moderate muscle fatigue noted at the end of session.  OBJECTIVE IMPAIRMENTS: decreased activity tolerance, difficulty walking, decreased strength, increased fascial restrictions, impaired perceived functional ability, and pain.   ACTIVITY LIMITATIONS: sitting, sleeping, and  locomotion level  PARTICIPATION LIMITATIONS: meal prep, cleaning, laundry, driving, and community activity  PERSONAL FACTORS: 1-2 comorbidities: osteoporosis, bil neuropathy in feet  are also affecting patient's functional outcome.   REHAB POTENTIAL: Good  CLINICAL DECISION MAKING: Stable/uncomplicated  EVALUATION COMPLEXITY: Low   GOALS: Goals reviewed with patient? Yes  SHORT TERM GOALS: Target date: 12/05/2023    The patient will demonstrate knowledge of basic self care strategies and exercises to promote healing  Baseline: Goal status: ongoing  2.  The patient will report a 40% improvement in pain levels with functional activities which are currently difficult including walking, sitting Baseline:  Goal status: INITIAL  3.  The patient will have improved hip strength to at least 4/5 needed for standing, walking longer distances and descending stairs at home and in the community  Baseline:  Goal status: INITIAL    LONG TERM GOALS: Target date: 01/02/2024   The  patient will be independent in a safe self progression of a home exercise program to promote further recovery of function   Baseline:  Goal status: INITIAL  2.  The patient will report a 75% improvement in pain levels with functional activities which are currently difficult including walking, sitting, as the day goes on Baseline:  Goal status: INITIAL  3.  The patient will have improved hip strength to at least 4+/5 needed for standing, walking longer distances and descending stairs at home and in the community  Baseline:  Goal status: INITIAL  4.  Able to walk 2.5 miles with pain level 2/10 Baseline:  Goal status: INITIAL  5.  Modified Oswestry score improved to 7% Baseline:  Goal status: INITIAL   PLAN:  PT FREQUENCY: 2x/week  PT DURATION: 8 weeks  PLANNED INTERVENTIONS: 97164- PT Re-evaluation, 97110-Therapeutic exercises, 97530- Therapeutic activity, 97112- Neuromuscular re-education, 97535-  Self Care, 16109- Manual therapy, [id number]- Aquatic Therapy, 97014- Electrical stimulation (unattended), [id number]- Electrical stimulation (manual), [id number]- Vasopneumatic device, [id number]- Ultrasound, [id number]- Traction (mechanical), [id number]- Ionotophoresis 4mg /ml Dexamethasone, Patient/Family education, Balance training, Stair training, Taping, Dry Needling, Joint mobilization, Spinal mobilization, Cryotherapy, and Moist heat.  PLAN FOR NEXT SESSION:   DN with ES;   Rt LE 2" or 4"; single leg [place] dead lift to stool height;   emphasis on Rt gluteal strength (has + Trendelenburg and hip abd/ext weakness)  Lavinia Sharps, PT 11/30/23 12:56 PM Phone: [phone] Fax: [phone]

## 2023-12-05 ENCOUNTER — Ambulatory Visit: Payer: Medicare HMO | Attending: Sports Medicine | Admitting: Physical Therapy

## 2023-12-05 DIAGNOSIS — M545 Low back pain, unspecified: Secondary | ICD-10-CM | POA: Insufficient documentation

## 2023-12-05 DIAGNOSIS — R293 Abnormal posture: Secondary | ICD-10-CM | POA: Diagnosis not present

## 2023-12-05 DIAGNOSIS — M6281 Muscle weakness (generalized): Secondary | ICD-10-CM | POA: Insufficient documentation

## 2023-12-05 NOTE — Therapy (Signed)
 OUTPATIENT PHYSICAL THERAPY THORACOLUMBAR TREATMENT   Patient Name: Terry Edwards MRN: [id number] DOB:10/27/1935, 88 y.o., female Today's Date: 12/05/2023  END OF SESSION:  PT End of Session - 12/05/23 0930     Visit Number 8    Date for PT Re-Evaluation 01/02/24    Authorization Type [employer]    Progress Note Due on Visit 10    PT Start Time 0930    PT Stop Time 1010    PT Time Calculation (min) 40 min    Activity Tolerance Patient tolerated treatment well                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   Cataract    removed bilateral eyes   Chronic hepatitis B without hepatic coma (HCC) 1970s   chronic elev AFP   Cyst of pineal gland    incidental on MRI   Heart murmur    Hemorrhoids    History of chicken pox    History of depression 2006   situational, on Lexapro for 1 year   Hyperlipidemia    Osteopenia    hx fx 5th MT R foot 03/2013   Plantar fasciitis of left foot    Per [facility] new patient packet   Right rotator cuff tendonitis 2013   resolved with PT (injections not helpful)   Urine incontinence    Vitamin D deficiency    Past Surgical History:  Procedure Laterality Date   APPENDECTOMY  1957   BLEPHAROPLASTY     Per [facility] new patient packet   COLONOSCOPY     EYE SURGERY Bilateral 03/09/15, 03/23/15   cataract removal   TONSILLECTOMY  1940's   Patient Active Problem List   Diagnosis Date Noted   Hardening of the aorta (main artery of the heart) (HCC) 06/14/2023   Thoracic aortic aneurysm (HCC) 07/27/2022   Elevated blood pressure reading without diagnosis of hypertension 11/13/2021   Right hand pain 11/13/2021   Impingement syndrome of shoulder region 10/25/2021   AF (amaurosis fugax) 07/09/2021   Drug-induced skin rash 04/23/2021   Cyst of joint of hand, right 01/26/2021   Thinning hair 10/12/2020   Murmur, cardiac  10/12/2020   Traumatic incomplete tear of right rotator cuff 10/12/2020   Urge incontinence 10/12/2020   Routine general medical examination at a health care facility 01/09/2015   Chronic toe pain, right foot 09/03/2014   Cyst of pineal gland    Vitamin D deficiency    Osteoporosis    Hx of type B viral hepatitis    Hyperlipidemia     PCP: Jackelyn Hoehn MD  REFERRING PROVIDER: Jackelyn Hoehn MD  REFERRING DIAG: M54.50 acute right sided low back pain without sciatica  Rationale for Evaluation and Treatment: Rehabilitation  THERAPY DIAG:  Back pain; weakness ONSET DATE: 3 months  SUBJECTIVE:  SUBJECTIVE STATEMENT: It gets worked up with the exercises but then it settles. "Tolerable"  Intake history from 11/06/22:  Symptoms began 2-3 months ago for no apparent reason.  Pain in bil low back, right SI region, right posterior thigh (upper to mid way) Does upper body weights at home, doesn't do lower body per se Had previous pelvic floor PT at this facility Had acupuncture (helpful)  PERTINENT HISTORY:  Osteoporosis; thoracic aortic aneurysm; bil foot neuropathy (numbness); right rotator cuff tear  PAIN:   Are you having pain? Yes NPRS scale: 2/10 earlier today it was irritated in the buttock but sitting in the car was OK Pain location: right buttock, distal lateral HS, ITB, low back Pain orientation: Right  PAIN TYPE: aching Pain description: intermittent  Aggravating factors: as the day goes on, sitting on a hard chair, sitting with legs crossed, sometimes walking; sidelying with legs bent Relieving factors: Tylenol, AM; PRECAUTIONS: None  WEIGHT BEARING RESTRICTIONS: No  FALLS:  Has patient fallen in last 6 months? No LIVING ENVIRONMENT: Lives with: lives alone Lives  in: House/apartment OCCUPATION: retired  PLOF: Independent  PATIENT GOALS: get rid of this discomfort, be able to walk without pain;  (sitting) do art work, sit for reading , cooking  NEXT MD VISIT: as needed   OBJECTIVE:  Note: Objective measures were completed at Evaluation unless otherwise noted.  DIAGNOSTIC FINDINGS:  2022: Results:   Lumbar spine L1-L4 Femoral neck (FN) 33% distal radius  T-score -2.6 RFN: -1.6 LFN: -2.0 n/a  Change in BMD from previous DXA test (%) Down 0.7% Down 2.0% n/a  (*) statistically significant  PATIENT SURVEYS:  Modified Oswestry 10%   COGNITION: Overall cognitive status: Within functional limits for tasks assessed     MUSCLE LENGTH:  neural tension right with slump test and SLR;  decreased right hip flexor length  POSTURE: decreased lumbar lordosis  PALPATION: Tender points in gluteals and piriformis on right; minimal tenderness ischial tuberosity and proximal HS  LUMBAR ROM:   AROM eval  Flexion WFLs  Extension 15   Right lateral flexion 20  Left lateral flexion 20  Right rotation   Left rotation    (Blank rows = not tested)  TRUNK STRENGTH:  Decreased activation of transverse abdominus muscles; abdominals 4-/5; decreased activation of lumbar multifidi; trunk extensors 4-/5   LOWER EXTREMITY ROM:   WFLs   LOWER EXTREMITY MMT:  decreased right hip abduction 4-/5; left SLS 8 sec with ease, right SLS 5 sec with pelvic drop and inc difficulty stabilizing 3/4: right hip abduction 4/5  LUMBAR SPECIAL TESTS:  + neural tension on right with slump test and SLR  FUNCTIONAL TESTS:  5x STS no Ues 10.3 sec  GAIT: Comments:   TREATMENT DATE:  12/05/23 Review of current HEP and response Discussion of upcoming flight to [facility] Manual therapy: soft tissue mobilization to right gluteals, right lumbar, right ITB, right lateral HS Trigger Point Dry Needling Subsequent Treatment: Instructions provided previously at initial dry needling  treatment.  Patient Verbal Consent Given: Yes Education Handout Provided: Previously Provided Muscles Treated: prone right gluteals, right lumbar multifidi, right ITB, right lateral HS Electrical Stimulation Performed: yes; 80 pps, 1.5 ma 20 min to muscles above Treatment Response/Outcome: Utilized skilled palpation to identify bony landmarks and trigger points.  Able to illicit twitch response and muscle elongation.   11/30/23 Prone glute squeeze x 10 Supine double bridge 7x Single leg bridge with towel roll hold in bend of the hip 2 sets  of 5 SLS with 1-2 finger support encouraged longer hold times > 5 sec Standing hip extension isometric into wall (progressed with foot off floor and single arm support) 5 sec hold 7x right/left Standing hip abduction ball on wall (feels more in quad instead of glute on right) 2 inch lateral step up/down 10x right/left single hand support (added to HEP) better activation of glutes Single leg [place] dead lift with reach down to knee level stool 10x right/left 2 finger and 1 finger support (added to HeP)   11/28/23 Hold on prone hip extension for now Prone gluteal squeezes 3 sec hold 10x(added to HEP) SLS at the counter 5 sec hold 10x (added to HEP) WB on right with left 3 ways 5x (added to HEP) Manual therapy: soft tissue mobilization to right gluteals, right lumbar, right ITB, right lateral HS Trigger Point Dry Needling Subsequent Treatment: Instructions provided previously at initial dry needling treatment.  Patient Verbal Consent Given: Yes Education Handout Provided: Previously Provided Muscles Treated: prone right gluteals, right lumbar multifidi, right ITB, right lateral HS Electrical Stimulation Performed: yes; 80 pps, 1.5 ma 8 min to muscles above Treatment Response/Outcome: Utilized skilled palpation to identify bony landmarks and trigger points.  Able to illicit twitch response and muscle elongation.    11/24/23 Attempted hip flexor stretch  on stairs  - WNL Pelvic press series; unilateral knee flex x 5 ea, B knee flex x 5 Prone hip ext 2x 5 B, then with pillow under waist 2x 5 B Prone hip IR/ER x 10 Prone upper trunk ext over pillow x 10 Manual: UPA mobs to decrease muscle tension: B lumbar - more on right due to tightness Supine hip flexor stretch B 2x 30 sec with pelvic tilt Sequential march with TA contraction - pulling in back right away even with TA contraction Supine TA contraction with SLR x 5 B Attempted hip flexor strengthening supine and sidelying plank - too difficult Standing isometric hip flexion 5 sec hold 2x 10 Seated marching x 10 pulling in R hip  PATIENT EDUCATION:  Education details: Educated patient on anatomy and physiology of current symptoms, prognosis, plan of care as well as initial self care strategies to promote recovery Person educated: Patient Education method: Explanation Education comprehension: verbalized understanding  HOME EXERCISE PROGRAM: Access Code: [email or web address] URL: [email or web address] Date: 11/30/2023 Prepared by: Lavinia Sharps  Exercises - Clamshell with Resistance  - 1 x daily - 7 x weekly - 1 sets - 5 hold - Supine Bridge with Resistance Band  - 1 x daily - 7 x weekly - 1 sets - 7-10 reps - Supine March  - 1 x daily - 7 x weekly - 1 sets - 7-10 reps - Sit to Stand  - 1 x daily - 7 x weekly - 2 sets - 5 reps - Hooklying Single Knee to Chest Stretch  - 1 x daily - 7 x weekly - 1 sets - 10 reps - 5 hold - Supine Piriformis Stretch with Foot on Ground  - 1 x daily - 7 x weekly - 1 sets - 2 reps - 30 hold - Supine Sciatic Nerve Glide  - 1 x daily - 7 x weekly - 2 sets - 10 reps - Supine Lower Trunk Rotation  - 1 x daily - 7 x weekly - 1 sets - 10 reps - Modified Thomas Stretch  - 1 x daily - 7 x weekly - 1 sets - 2 reps - 30 hold - Supine Posterior Pelvic  Tilt  - 1 x daily - 7 x weekly - 1 sets - 10 reps - wall isometrics  - 1 x daily - 7 x weekly - 2 sets - 5 reps -  5 hold - Standing Isometric Hip Abduction with Ball on Wall  - 1 x daily - 7 x weekly - 2 sets - 5 reps - 5 hold - Hooklying Isometric Hip Flexion with Opposite Arm  - 1 x daily - 7 x weekly - 2 sets - 5 reps - 5 hold - Supine Pelvic Tilt with Straight Leg Raise  - 1 x daily - 3 x weekly - 2 sets - 10 reps - Hooklying Sequential Leg March and Lower  - 1 x daily - 3 x weekly - 2 sets - 10 reps - Standing Isometric Hip Flexion with Ball at [employer] (Mirrored)  - 1 x daily - 7 x weekly - 2 sets - 10 reps - 5-10 sec hold - Prone Hip Extension with Pillow Under Abdomen  - 1 x daily - 3 x weekly - 2 sets - 10 reps - Prone Gluteal Sets  - 1 x daily - 7 x weekly - 10 reps - 3 hold - Standing Single Leg Stance with Counter Support  - 1 x daily - 7 x weekly - 1 sets - 10 reps - 5 hold - Standing 4-Way Leg Reach with Counter Support  - 1 x daily - 7 x weekly - 1 sets - 5 reps - Lateral Step Up with Counter Support  - 1 x daily - 7 x weekly - 1 sets - 10 reps - Forward T with Counter Support  - 1 x daily - 7 x weekly - 1 sets - 10 reps  ASSESSMENT:  CLINICAL IMPRESSION: Reggie responds well to DN combined with electrical stimulation to indwelling needles for further impacts on pain relief and neuromuscular changes.  She continues to have tender points particularly in lateral HS and ITB.  Some (expected) achiness post session which should improve with movement. She plans to do a walk later today.  Therapist monitoring response and modifying treatment accordingly.     OBJECTIVE IMPAIRMENTS: decreased activity tolerance, difficulty walking, decreased strength, increased fascial restrictions, impaired perceived functional ability, and pain.   ACTIVITY LIMITATIONS: sitting, sleeping, and locomotion level  PARTICIPATION LIMITATIONS: meal prep, cleaning, laundry, driving, and community activity  PERSONAL FACTORS: 1-2 comorbidities: osteoporosis, bil neuropathy in feet  are also affecting patient's functional  outcome.   REHAB POTENTIAL: Good  CLINICAL DECISION MAKING: Stable/uncomplicated  EVALUATION COMPLEXITY: Low   GOALS: Goals reviewed with patient? Yes  SHORT TERM GOALS: Target date: 12/05/2023    The patient will demonstrate knowledge of basic self care strategies and exercises to promote healing  Baseline: Goal status: ongoing  2.  The patient will report a 40% improvement in pain levels with functional activities which are currently difficult including walking, sitting Baseline:  Goal status: INITIAL  3.  The patient will have improved hip strength to at least 4/5 needed for standing, walking longer distances and descending stairs at home and in the community  Baseline:  Goal status: met 3/4    LONG TERM GOALS: Target date: 01/02/2024   The patient will be independent in a safe self progression of a home exercise program to promote further recovery of function   Baseline:  Goal status: INITIAL  2.  The patient will report a 75% improvement in pain levels with functional activities which are currently difficult  including walking, sitting, as the day goes on Baseline:  Goal status: INITIAL  3.  The patient will have improved hip strength to at least 4+/5 needed for standing, walking longer distances and descending stairs at home and in the community  Baseline:  Goal status: INITIAL  4.  Able to walk 2.5 miles with pain level 2/10 Baseline:  Goal status: INITIAL  5.  Modified Oswestry score improved to 7% Baseline:  Goal status: INITIAL   PLAN:  PT FREQUENCY: 2x/week  PT DURATION: 8 weeks  PLANNED INTERVENTIONS: 97164- PT Re-evaluation, 97110-Therapeutic exercises, 97530- Therapeutic activity, 97112- Neuromuscular re-education, 97535- Self Care, 16109- Manual therapy, [id number]- Aquatic Therapy, 97014- Electrical stimulation (unattended), [id number]- Electrical stimulation (manual), [id number]- Vasopneumatic device, [id number]- Ultrasound, [id number]- Traction (mechanical), [id number]-  Ionotophoresis 4mg /ml Dexamethasone, Patient/Family education, Balance training, Stair training, Taping, Dry Needling, Joint mobilization, Spinal mobilization, Cryotherapy, and Moist heat.  PLAN FOR NEXT SESSION:  assess response to DN with ES;   Rt LE 2" or 4"; add weight to single leg [place] dead lift to stool height;   emphasis on Rt gluteal strength (has + Trendelenburg and hip abd/ext weakness); going to [facility]  Lavinia Sharps, PT 12/05/23 5:53 PM Phone: [phone] Fax: [phone]

## 2023-12-07 ENCOUNTER — Ambulatory Visit: Payer: Medicare HMO | Admitting: Physical Therapy

## 2023-12-07 DIAGNOSIS — M545 Low back pain, unspecified: Secondary | ICD-10-CM | POA: Diagnosis not present

## 2023-12-07 DIAGNOSIS — M6281 Muscle weakness (generalized): Secondary | ICD-10-CM | POA: Diagnosis not present

## 2023-12-07 DIAGNOSIS — R293 Abnormal posture: Secondary | ICD-10-CM | POA: Diagnosis not present

## 2023-12-07 NOTE — Therapy (Signed)
 OUTPATIENT PHYSICAL THERAPY THORACOLUMBAR TREATMENT   Patient Name: Terry Edwards [place] MRN: [id number] DOB:09-Jun-1936, 88 y.o., female Today's Date: 12/07/2023  END OF SESSION:  PT End of Session - 12/07/23 0928     Visit Number 9    Date for PT Re-Evaluation 01/02/24    Authorization Type [employer]    Progress Note Due on Visit 10    PT Start Time 0929    PT Stop Time 1010    PT Time Calculation (min) 41 min    Activity Tolerance Patient tolerated treatment well                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   Cataract    removed bilateral eyes   Chronic hepatitis B without hepatic coma (HCC) 1970s   chronic elev AFP   Cyst of pineal gland    incidental on MRI   Heart murmur    Hemorrhoids    History of chicken pox    History of depression 2006   situational, on Lexapro for 1 year   Hyperlipidemia    Osteopenia    hx fx 5th MT R foot 03/2013   Plantar fasciitis of left foot    Per [facility] new patient packet   Right rotator cuff tendonitis 2013   resolved with PT (injections not helpful)   Urine incontinence    Vitamin D deficiency    Past Surgical History:  Procedure Laterality Date   APPENDECTOMY  1957   BLEPHAROPLASTY     Per [facility] new patient packet   COLONOSCOPY     EYE SURGERY Bilateral 03/09/15, 03/23/15   cataract removal   TONSILLECTOMY  1940's   Patient Active Problem List   Diagnosis Date Noted   Hardening of the aorta (main artery of the heart) (HCC) 06/14/2023   Thoracic aortic aneurysm (HCC) 07/27/2022   Elevated blood pressure reading without diagnosis of hypertension 11/13/2021   Right hand pain 11/13/2021   Impingement syndrome of shoulder region 10/25/2021   AF (amaurosis fugax) 07/09/2021   Drug-induced skin rash 04/23/2021   Cyst of joint of hand, right 01/26/2021   Thinning hair 10/12/2020   Murmur, cardiac  10/12/2020   Traumatic incomplete tear of right rotator cuff 10/12/2020   Urge incontinence 10/12/2020   Routine general medical examination at a health care facility 01/09/2015   Chronic toe pain, right foot 09/03/2014   Cyst of pineal gland    Vitamin D deficiency    Osteoporosis    Hx of type B viral hepatitis    Hyperlipidemia     PCP: Jackelyn Hoehn MD  REFERRING PROVIDER: Jackelyn Hoehn MD  REFERRING DIAG: M54.50 acute right sided low back pain without sciatica  Rationale for Evaluation and Treatment: Rehabilitation  THERAPY DIAG:  Back pain; weakness ONSET DATE: 3 months  SUBJECTIVE:  SUBJECTIVE STATEMENT: Good but very sore after DN.  It doesn't wake me up anymore.  Sat at the theater last night and got very achy.   Intake history from 11/06/22:  Symptoms began 2-3 months ago for no apparent reason.  Pain in bil low back, right SI region, right posterior thigh (upper to mid way) Does upper body weights at home, doesn't do lower body per se Had previous pelvic floor PT at this facility Had acupuncture (helpful)  PERTINENT HISTORY:  Osteoporosis; thoracic aortic aneurysm; bil foot neuropathy (numbness); right rotator cuff tear  PAIN:   Are you having pain? Yes NPRS scale: 2/10 earlier today it was irritated in the buttock but sitting in the car was OK Pain location: right buttock, distal lateral HS, ITB, low back Pain orientation: Right  PAIN TYPE: aching Pain description: intermittent  Aggravating factors: as the day goes on, sitting on a hard chair, sitting with legs crossed, sometimes walking; sidelying with legs bent Relieving factors: Tylenol, AM; PRECAUTIONS: None  WEIGHT BEARING RESTRICTIONS: No  FALLS:  Has patient fallen in last 6 months? No LIVING  ENVIRONMENT: Lives with: lives alone Lives in: House/apartment OCCUPATION: retired  PLOF: Independent  PATIENT GOALS: get rid of this discomfort, be able to walk without pain;  (sitting) do art work, sit for reading , cooking  NEXT MD VISIT: as needed   OBJECTIVE:  Note: Objective measures were completed at Evaluation unless otherwise noted.  DIAGNOSTIC FINDINGS:  2022: Results:   Lumbar spine L1-L4 Femoral neck (FN) 33% distal radius  T-score -2.6 RFN: -1.6 LFN: -2.0 n/a  Change in BMD from previous DXA test (%) Down 0.7% Down 2.0% n/a  (*) statistically significant  PATIENT SURVEYS:  Modified Oswestry 10%   COGNITION: Overall cognitive status: Within functional limits for tasks assessed     MUSCLE LENGTH:  neural tension right with slump test and SLR;  decreased right hip flexor length  POSTURE: decreased lumbar lordosis  PALPATION: Tender points in gluteals and piriformis on right; minimal tenderness ischial tuberosity and proximal HS  LUMBAR ROM:   AROM eval  Flexion WFLs  Extension 15   Right lateral flexion 20  Left lateral flexion 20  Right rotation   Left rotation    (Blank rows = not tested)  TRUNK STRENGTH:  Decreased activation of transverse abdominus muscles; abdominals 4-/5; decreased activation of lumbar multifidi; trunk extensors 4-/5   LOWER EXTREMITY ROM:   WFLs   LOWER EXTREMITY MMT:  decreased right hip abduction 4-/5; left SLS 8 sec with ease, right SLS 5 sec with pelvic drop and inc difficulty stabilizing 3/4: right hip abduction 4/5  LUMBAR SPECIAL TESTS:  + neural tension on right with slump test and SLR  FUNCTIONAL TESTS:  5x STS no Ues 10.3 sec  GAIT: Comments:   TREATMENT DATE:  12/07/23 Supine double bridge 5x Single leg bridge with towel roll hold in bend of the hip 2 sets of 5 (added to HEP) 4 inch lateral step up/down 15x right/left single hand support  Single leg [place] dead lift with 5# reach down to knee level stool  10x right/left 2 finger and 1 finger support (added weight to HEP) 4 inch forward step up 5 sec hold at the top 10x  WB on right with left hip flexion, holding 5# weight left hand 15x (added to HEP) Staggered standing with heavy pink loop around ankles isometric hip extension 5 sec hold 10x right/left (pain 1-2/10)   12/05/23 Review of current  HEP and response Discussion of upcoming flight to [employer] Manual therapy: soft tissue mobilization to right gluteals, right lumbar, right ITB, right lateral HS Trigger Point Dry Needling Subsequent Treatment: Instructions provided previously at initial dry needling treatment.  Patient Verbal Consent Given: Yes Education Handout Provided: Previously Provided Muscles Treated: prone right gluteals, right lumbar multifidi, right ITB, right lateral HS Electrical Stimulation Performed: yes; 80 pps, 1.5 ma 20 min to muscles above Treatment Response/Outcome: Utilized skilled palpation to identify bony landmarks and trigger points.  Able to illicit twitch response and muscle elongation.   11/30/23 Prone glute squeeze x 10 Supine double bridge 7x Single leg bridge with towel roll hold in bend of the hip 2 sets of 5 SLS with 1-2 finger support encouraged longer hold times > 5 sec Standing hip extension isometric into wall (progressed with foot off floor and single arm support) 5 sec hold 7x right/left Standing hip abduction ball on wall (feels more in quad instead of glute on right) 2 inch lateral step up/down 10x right/left single hand support (added to HEP) better activation of glutes Single leg [place] dead lift with reach down to knee level stool 10x right/left 2 finger and 1 finger support (added to HeP)   11/28/23 Hold on prone hip extension for now Prone gluteal squeezes 3 sec hold 10x(added to HEP) SLS at the counter 5 sec hold 10x (added to HEP) WB on right with left 3 ways 5x (added to HEP) Manual therapy: soft tissue mobilization to right  gluteals, right lumbar, right ITB, right lateral HS Trigger Point Dry Needling Subsequent Treatment: Instructions provided previously at initial dry needling treatment.  Patient Verbal Consent Given: Yes Education Handout Provided: Previously Provided Muscles Treated: prone right gluteals, right lumbar multifidi, right ITB, right lateral HS Electrical Stimulation Performed: yes; 80 pps, 1.5 ma 8 min to muscles above Treatment Response/Outcome: Utilized skilled palpation to identify bony landmarks and trigger points.  Able to illicit twitch response and muscle elongation.    11/24/23 Attempted hip flexor stretch on stairs  - WNL Pelvic press series; unilateral knee flex x 5 ea, B knee flex x 5 Prone hip ext 2x 5 B, then with pillow under waist 2x 5 B Prone hip IR/ER x 10 Prone upper trunk ext over pillow x 10 Manual: UPA mobs to decrease muscle tension: B lumbar - more on right due to tightness Supine hip flexor stretch B 2x 30 sec with pelvic tilt Sequential march with TA contraction - pulling in back right away even with TA contraction Supine TA contraction with SLR x 5 B Attempted hip flexor strengthening supine and sidelying plank - too difficult Standing isometric hip flexion 5 sec hold 2x 10 Seated marching x 10 pulling in R hip  PATIENT EDUCATION:  Education details: Educated patient on anatomy and physiology of current symptoms, prognosis, plan of care as well as initial self care strategies to promote recovery Person educated: Patient Education method: Explanation Education comprehension: verbalized understanding  HOME EXERCISE PROGRAM: Access Code: [email or web address] URL: [email or web address] Date: 12/07/2023 Prepared by: Lavinia Sharps  Exercises - Clamshell with Resistance  - 1 x daily - 7 x weekly - 1 sets - 5 hold - Supine Bridge with Resistance Band  - 1 x daily - 7 x weekly - 1 sets - 7-10 reps - Supine March  - 1 x daily - 7 x weekly - 1 sets - 7-10 reps -  Sit to Stand  - 1 x daily - 7 x weekly -  2 sets - 5 reps - Hooklying Single Knee to Chest Stretch  - 1 x daily - 7 x weekly - 1 sets - 10 reps - 5 hold - Supine Piriformis Stretch with Foot on Ground  - 1 x daily - 7 x weekly - 1 sets - 2 reps - 30 hold - Supine Sciatic Nerve Glide  - 1 x daily - 7 x weekly - 2 sets - 10 reps - Supine Lower Trunk Rotation  - 1 x daily - 7 x weekly - 1 sets - 10 reps - Modified Thomas Stretch  - 1 x daily - 7 x weekly - 1 sets - 2 reps - 30 hold - Supine Posterior Pelvic Tilt  - 1 x daily - 7 x weekly - 1 sets - 10 reps - wall isometrics  - 1 x daily - 7 x weekly - 2 sets - 5 reps - 5 hold - Standing Isometric Hip Abduction with Ball on Wall  - 1 x daily - 7 x weekly - 2 sets - 5 reps - 5 hold - Hooklying Isometric Hip Flexion with Opposite Arm  - 1 x daily - 7 x weekly - 2 sets - 5 reps - 5 hold - Supine Pelvic Tilt with Straight Leg Raise  - 1 x daily - 3 x weekly - 2 sets - 10 reps - Hooklying Sequential Leg March and Lower  - 1 x daily - 3 x weekly - 2 sets - 10 reps - Standing Isometric Hip Flexion with Ball at [employer] (Mirrored)  - 1 x daily - 7 x weekly - 2 sets - 10 reps - 5-10 sec hold - Prone Hip Extension with Pillow Under Abdomen  - 1 x daily - 3 x weekly - 2 sets - 10 reps - Prone Gluteal Sets  - 1 x daily - 7 x weekly - 10 reps - 3 hold - Standing Single Leg Stance with Counter Support  - 1 x daily - 7 x weekly - 1 sets - 10 reps - 5 hold - Standing 4-Way Leg Reach with Counter Support  - 1 x daily - 7 x weekly - 1 sets - 5 reps - Lateral Step Up with Counter Support  - 1 x daily - 7 x weekly - 1 sets - 10 reps - Forward T with Counter Support  - 1 x daily - 7 x weekly - 1 sets - 10 reps - Single Leg Bridge  - 1 x daily - 7 x weekly - 2 sets - 5 reps - Standing Marching  - 1 x daily - 7 x weekly - 1 sets - 15 reps ASSESSMENT:  CLINICAL IMPRESSION: Dorlis is able to progress exercises with added resistance or repetitions as pain intensity continues  to lessen.  She has fatigue in gluteal muscles with higher repetitions (approx 15 reps) with weight bearing exercises. She is compliant with her HEP which improved long term prognosis. Verbal cues for technique to cue activation of gluteals.      OBJECTIVE IMPAIRMENTS: decreased activity tolerance, difficulty walking, decreased strength, increased fascial restrictions, impaired perceived functional ability, and pain.   ACTIVITY LIMITATIONS: sitting, sleeping, and locomotion level  PARTICIPATION LIMITATIONS: meal prep, cleaning, laundry, driving, and community activity  PERSONAL FACTORS: 1-2 comorbidities: osteoporosis, bil neuropathy in feet  are also affecting patient's functional outcome.   REHAB POTENTIAL: Good  CLINICAL DECISION MAKING: Stable/uncomplicated  EVALUATION COMPLEXITY: Low   GOALS: Goals reviewed with patient? Yes  SHORT  TERM GOALS: Target date: 12/05/2023    The patient will demonstrate knowledge of basic self care strategies and exercises to promote healing  Baseline: Goal status: ongoing  2.  The patient will report a 40% improvement in pain levels with functional activities which are currently difficult including walking, sitting Baseline:  Goal status: INITIAL  3.  The patient will have improved hip strength to at least 4/5 needed for standing, walking longer distances and descending stairs at home and in the community  Baseline:  Goal status: met 3/4    LONG TERM GOALS: Target date: 01/02/2024   The patient will be independent in a safe self progression of a home exercise program to promote further recovery of function   Baseline:  Goal status: INITIAL  2.  The patient will report a 75% improvement in pain levels with functional activities which are currently difficult including walking, sitting, as the day goes on Baseline:  Goal status: INITIAL  3.  The patient will have improved hip strength to at least 4+/5 needed for standing, walking longer  distances and descending stairs at home and in the community  Baseline:  Goal status: INITIAL  4.  Able to walk 2.5 miles with pain level 2/10 Baseline:  Goal status: INITIAL  5.  Modified Oswestry score improved to 7% Baseline:  Goal status: INITIAL   PLAN:  PT FREQUENCY: 2x/week  PT DURATION: 8 weeks  PLANNED INTERVENTIONS: 97164- PT Re-evaluation, 97110-Therapeutic exercises, 97530- Therapeutic activity, 97112- Neuromuscular re-education, 97535- Self Care, [id number]- Manual therapy, [id number]- Aquatic Therapy, 97014- Electrical stimulation (unattended), [id number]- Electrical stimulation (manual), [id number]- Vasopneumatic device, [id number]- Ultrasound, [id number]- Traction (mechanical), [id number]- Ionotophoresis 4mg /ml Dexamethasone, Patient/Family education, Balance training, Stair training, Taping, Dry Needling, Joint mobilization, Spinal mobilization, Cryotherapy, and Moist heat.  PLAN FOR NEXT SESSION:  10th visit prog note; check % improvement; DN with ES;   Rt LE 2" or 4"; add weight to single leg [place] dead lift to stool height;   emphasis on Rt gluteal strength (has + Trendelenburg and hip abd/ext weakness); going to [place];  has 3# and 5# at home; looking for a small step  Lavinia Sharps, PT 12/07/23 3:08 PM Phone: [phone] Fax: [phone]

## 2023-12-12 ENCOUNTER — Ambulatory Visit: Payer: Medicare HMO | Admitting: Physical Therapy

## 2023-12-12 DIAGNOSIS — M545 Low back pain, unspecified: Secondary | ICD-10-CM

## 2023-12-12 DIAGNOSIS — R293 Abnormal posture: Secondary | ICD-10-CM | POA: Diagnosis not present

## 2023-12-12 DIAGNOSIS — M6281 Muscle weakness (generalized): Secondary | ICD-10-CM

## 2023-12-12 NOTE — Therapy (Signed)
 OUTPATIENT PHYSICAL THERAPY THORACOLUMBAR TREATMENT   Patient Name: Terry Edwards [place] MRN: [id number] DOB:Oct 11, 1935, 88 y.o., female Today's Date: 12/12/2023  Progress Note Reporting Period 11/07/23 to 12/12/23  See note below for Objective Data and Assessment of Progress/Goals.      END OF SESSION:  PT End of Session - 12/12/23 0938     Visit Number 10    Date for PT Re-Evaluation 01/02/24    Authorization Type [employer]    Progress Note Due on Visit 20    PT Start Time 0931    PT Stop Time 1013    PT Time Calculation (min) 42 min    Activity Tolerance Patient tolerated treatment well                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   Cataract    removed bilateral eyes   Chronic hepatitis B without hepatic coma (HCC) 1970s   chronic elev AFP   Cyst of pineal gland    incidental on MRI   Heart murmur    Hemorrhoids    History of chicken pox    History of depression 2006   situational, on Lexapro for 1 year   Hyperlipidemia    Osteopenia    hx fx 5th MT R foot 03/2013   Plantar fasciitis of left foot    Per [facility] new patient packet   Right rotator cuff tendonitis 2013   resolved with PT (injections not helpful)   Urine incontinence    Vitamin D deficiency    Past Surgical History:  Procedure Laterality Date   APPENDECTOMY  1957   BLEPHAROPLASTY     Per [facility] new patient packet   COLONOSCOPY     EYE SURGERY Bilateral 03/09/15, 03/23/15   cataract removal   TONSILLECTOMY  1940's   Patient Active Problem List   Diagnosis Date Noted   Hardening of the aorta (main artery of the heart) (HCC) 06/14/2023   Thoracic aortic aneurysm (HCC) 07/27/2022   Elevated blood pressure reading without diagnosis of hypertension 11/13/2021   Right hand pain 11/13/2021   Impingement syndrome of shoulder region 10/25/2021   AF (amaurosis fugax)  07/09/2021   Drug-induced skin rash 04/23/2021   Cyst of joint of hand, right 01/26/2021   Thinning hair 10/12/2020   Murmur, cardiac 10/12/2020   Traumatic incomplete tear of right rotator cuff 10/12/2020   Urge incontinence 10/12/2020   Routine general medical examination at a health care facility 01/09/2015   Chronic toe pain, right foot 09/03/2014   Cyst of pineal gland    Vitamin D deficiency    Osteoporosis    Hx of type B viral hepatitis    Hyperlipidemia     PCP: Jackelyn Hoehn MD  REFERRING PROVIDER: Jackelyn Hoehn MD  REFERRING DIAG: M54.50 acute right sided low back pain without sciatica  Rationale for Evaluation and Treatment: Rehabilitation  THERAPY DIAG:  Back pain; weakness ONSET DATE: 3 months  SUBJECTIVE:  SUBJECTIVE STATEMENT: I'm about 75% better. Can walk about 3 miles and do OK while walking but will be achy afterwards.   Intake history from 11/06/22:  Symptoms began 2-3 months ago for no apparent reason.  Pain in bil low back, right SI region, right posterior thigh (upper to mid way) Does upper body weights at home, doesn't do lower body per se Had previous pelvic floor PT at this facility Had acupuncture (helpful)  PERTINENT HISTORY:  Osteoporosis; thoracic aortic aneurysm; bil foot neuropathy (numbness); right rotator cuff tear  PAIN:   Are you having pain? Yes NPRS scale: 1-2/10 right lateral thigh, buttock Pain location: right buttock, distal lateral HS, ITB, low back Pain orientation: Right  PAIN TYPE: aching Pain description: intermittent  Aggravating factors: as the day goes on, sitting on a hard chair, sitting with legs crossed, sometimes walking; sidelying with legs bent Relieving factors: Tylenol, AM; PRECAUTIONS: None  WEIGHT BEARING  RESTRICTIONS: No  FALLS:  Has patient fallen in last 6 months? No LIVING ENVIRONMENT: Lives with: lives alone Lives in: House/apartment OCCUPATION: retired  PLOF: Independent  PATIENT GOALS: get rid of this discomfort, be able to walk without pain;  (sitting) do art work, sit for reading , cooking  NEXT MD VISIT: as needed   OBJECTIVE:  Note: Objective measures were completed at Evaluation unless otherwise noted.  DIAGNOSTIC FINDINGS:  2022: Results:   Lumbar spine L1-L4 Femoral neck (FN) 33% distal radius  T-score -2.6 RFN: -1.6 LFN: -2.0 n/a  Change in BMD from previous DXA test (%) Down 0.7% Down 2.0% n/a  (*) statistically significant  PATIENT SURVEYS:  Modified Oswestry 10%   COGNITION: Overall cognitive status: Within functional limits for tasks assessed     MUSCLE LENGTH:  neural tension right with slump test and SLR;  decreased right hip flexor length  POSTURE: decreased lumbar lordosis  PALPATION: Tender points in gluteals and piriformis on right; minimal tenderness ischial tuberosity and proximal HS  LUMBAR ROM:   AROM eval 3/11  Flexion WFLs   Extension 15    Right lateral flexion 20   Left lateral flexion 20   Right rotation    Left rotation     (Blank rows = not tested)  TRUNK STRENGTH:  Decreased activation of transverse abdominus muscles; abdominals 4-/5; decreased activation of lumbar multifidi; trunk extensors 4-/5   LOWER EXTREMITY ROM:   WFLs   LOWER EXTREMITY MMT:  decreased right hip abduction 4-/5; left SLS 8 sec with ease, right SLS 5 sec with pelvic drop and inc difficulty stabilizing 3/4: right hip abduction 4/5 3/11: right hip abduction 4/5  LUMBAR SPECIAL TESTS:  + neural tension on right with slump test and SLR  FUNCTIONAL TESTS:  5x STS no Ues 10.3 sec  GAIT: Comments:   TREATMENT DATE:  12/12/23 Review of current HEP and technique (back of the shoe push rather than bottom of shoe) Response to treatment and progress  toward goals Manual therapy: soft tissue mobilization to right gluteals, right lumbar, right ITB, right lateral HS Trigger Point Dry Needling Subsequent Treatment: Instructions provided previously at initial dry needling treatment.  Patient Verbal Consent Given: Yes Education Handout Provided: Previously Provided Muscles Treated: prone right gluteals, right lumbar multifidi, right ITB, right lateral HS Electrical Stimulation Performed: yes; 80 pps, 1.5 ma 20 min to muscles above Treatment Response/Outcome: Utilized skilled palpation to identify bony landmarks and trigger points.  Able to illicit twitch response and muscle elongation.   12/07/23 Supine double bridge  5x Single leg bridge with towel roll hold in bend of the hip 2 sets of 5 (added to HEP) 4 inch lateral step up/down 15x right/left single hand support  Single leg [place] dead lift with 5# reach down to knee level stool 10x right/left 2 finger and 1 finger support (added weight to HEP) 4 inch forward step up 5 sec hold at the top 10x  WB on right with left hip flexion, holding 5# weight left hand 15x (added to HEP) Staggered standing with heavy pink loop around ankles isometric hip extension 5 sec hold 10x right/left (pain 1-2/10)   12/05/23 Review of current HEP and response Discussion of upcoming flight to [facility] Manual therapy: soft tissue mobilization to right gluteals, right lumbar, right ITB, right lateral HS Trigger Point Dry Needling Subsequent Treatment: Instructions provided previously at initial dry needling treatment.  Patient Verbal Consent Given: Yes Education Handout Provided: Previously Provided Muscles Treated: prone right gluteals, right lumbar multifidi, right ITB, right lateral HS Electrical Stimulation Performed: yes; 80 pps, 1.5 ma 20 min to muscles above Treatment Response/Outcome: Utilized skilled palpation to identify bony landmarks and trigger points.  Able to illicit twitch response and muscle  elongation.   11/30/23 Prone glute squeeze x 10 Supine double bridge 7x Single leg bridge with towel roll hold in bend of the hip 2 sets of 5 SLS with 1-2 finger support encouraged longer hold times > 5 sec Standing hip extension isometric into wall (progressed with foot off floor and single arm support) 5 sec hold 7x right/left Standing hip abduction ball on wall (feels more in quad instead of glute on right) 2 inch lateral step up/down 10x right/left single hand support (added to HEP) better activation of glutes Single leg [place] dead lift with reach down to knee level stool 10x right/left 2 finger and 1 finger support (added to HeP)   PATIENT EDUCATION:  Education details: Educated patient on anatomy and physiology of current symptoms, prognosis, plan of care as well as initial self care strategies to promote recovery Person educated: Patient Education method: Explanation Education comprehension: verbalized understanding  HOME EXERCISE PROGRAM: Access Code: [email or web address] URL: [email or web address] Date: 12/07/2023 Prepared by: Lavinia Sharps  Exercises - Clamshell with Resistance  - 1 x daily - 7 x weekly - 1 sets - 5 hold - Supine Bridge with Resistance Band  - 1 x daily - 7 x weekly - 1 sets - 7-10 reps - Supine March  - 1 x daily - 7 x weekly - 1 sets - 7-10 reps - Sit to Stand  - 1 x daily - 7 x weekly - 2 sets - 5 reps - Hooklying Single Knee to Chest Stretch  - 1 x daily - 7 x weekly - 1 sets - 10 reps - 5 hold - Supine Piriformis Stretch with Foot on Ground  - 1 x daily - 7 x weekly - 1 sets - 2 reps - 30 hold - Supine Sciatic Nerve Glide  - 1 x daily - 7 x weekly - 2 sets - 10 reps - Supine Lower Trunk Rotation  - 1 x daily - 7 x weekly - 1 sets - 10 reps - Modified Thomas Stretch  - 1 x daily - 7 x weekly - 1 sets - 2 reps - 30 hold - Supine Posterior Pelvic Tilt  - 1 x daily - 7 x weekly - 1 sets - 10 reps - wall isometrics  - 1 x daily - 7 x weekly -  2 sets  - 5 reps - 5 hold - Standing Isometric Hip Abduction with Ball on Wall  - 1 x daily - 7 x weekly - 2 sets - 5 reps - 5 hold - Hooklying Isometric Hip Flexion with Opposite Arm  - 1 x daily - 7 x weekly - 2 sets - 5 reps - 5 hold - Supine Pelvic Tilt with Straight Leg Raise  - 1 x daily - 3 x weekly - 2 sets - 10 reps - Hooklying Sequential Leg March and Lower  - 1 x daily - 3 x weekly - 2 sets - 10 reps - Standing Isometric Hip Flexion with Ball at [employer] (Mirrored)  - 1 x daily - 7 x weekly - 2 sets - 10 reps - 5-10 sec hold - Prone Hip Extension with Pillow Under Abdomen  - 1 x daily - 3 x weekly - 2 sets - 10 reps - Prone Gluteal Sets  - 1 x daily - 7 x weekly - 10 reps - 3 hold - Standing Single Leg Stance with Counter Support  - 1 x daily - 7 x weekly - 1 sets - 10 reps - 5 hold - Standing 4-Way Leg Reach with Counter Support  - 1 x daily - 7 x weekly - 1 sets - 5 reps - Lateral Step Up with Counter Support  - 1 x daily - 7 x weekly - 1 sets - 10 reps - Forward T with Counter Support  - 1 x daily - 7 x weekly - 1 sets - 10 reps - Single Leg Bridge  - 1 x daily - 7 x weekly - 2 sets - 5 reps - Standing Marching  - 1 x daily - 7 x weekly - 1 sets - 15 reps ASSESSMENT:  CLINICAL IMPRESSION: The patient benefits significantly from dry needling combined with ES and manual therapy to stimulate underlying myofascial trigger points and muscular tissue for management of neuromusculoskeletal pain and address movement impairments.  She is highly compliant with her HEP with improves long term prognosis.  Good progress with rehab goals and she would benefit from a continuation of skilled PT for a further progression of strengthening and functional mobility.      OBJECTIVE IMPAIRMENTS: decreased activity tolerance, difficulty walking, decreased strength, increased fascial restrictions, impaired perceived functional ability, and pain.   ACTIVITY LIMITATIONS: sitting, sleeping, and locomotion  level  PARTICIPATION LIMITATIONS: meal prep, cleaning, laundry, driving, and community activity  PERSONAL FACTORS: 1-2 comorbidities: osteoporosis, bil neuropathy in feet  are also affecting patient's functional outcome.   REHAB POTENTIAL: Good  CLINICAL DECISION MAKING: Stable/uncomplicated  EVALUATION COMPLEXITY: Low   GOALS: Goals reviewed with patient? Yes  SHORT TERM GOALS: Target date: 12/05/2023    The patient will demonstrate knowledge of basic self care strategies and exercises to promote healing  Baseline: Goal status: met 3/11  2.  The patient will report a 40% improvement in pain levels with functional activities which are currently difficult including walking, sitting Baseline:  Goal status: met 3/11  3.  The patient will have improved hip strength to at least 4/5 needed for standing, walking longer distances and descending stairs at home and in the community  Baseline:  Goal status: met 3/4    LONG TERM GOALS: Target date: 01/02/2024   The patient will be independent in a safe self progression of a home exercise program to promote further recovery of function   Baseline:  Goal status: INITIAL  2.  The patient will report a 75% improvement in pain levels with functional activities which are currently difficult including walking, sitting, as the day goes on Baseline:  Goal status: met 3/11  3.  The patient will have improved hip strength to at least 4+/5 needed for standing, walking longer distances and descending stairs at home and in the community  Baseline:  Goal status: INITIAL  4.  Able to walk 2.5 miles with pain level 2/10 Baseline:  Goal status: met 3/11 can do 3 miles a little achy after  5.  Modified Oswestry score improved to 7% Baseline:  Goal status: INITIAL   PLAN:  PT FREQUENCY: 2x/week  PT DURATION: 8 weeks  PLANNED INTERVENTIONS: 97164- PT Re-evaluation, 97110-Therapeutic exercises, 97530- Therapeutic activity, 97112-  Neuromuscular re-education, 97535- Self Care, 16109- Manual therapy, [id number]- Aquatic Therapy, 97014- Electrical stimulation (unattended), [id number]- Electrical stimulation (manual), [id number]- Vasopneumatic device, [id number]- Ultrasound, [id number]- Traction (mechanical), [id number]- Ionotophoresis 4mg /ml Dexamethasone, Patient/Family education, Balance training, Stair training, Taping, Dry Needling, Joint mobilization, Spinal mobilization, Cryotherapy, and Moist heat.  PLAN FOR NEXT SESSION: do 2nd page of Oswestry;  DN with ES;   Rt LE  4";  weighted to single leg [place] dead lift to stool height;   emphasis on Rt gluteal strength (has + Trendelenburg and hip abd/ext weakness); going to [place];  has 3# and 5# at home; looking for a small step  Lavinia Sharps, PT 12/12/23 7:36 PM Phone: [phone] Fax: [phone]

## 2023-12-14 ENCOUNTER — Ambulatory Visit: Payer: Medicare HMO | Admitting: Physical Therapy

## 2023-12-14 ENCOUNTER — Encounter: Payer: Medicare HMO | Admitting: Physical Therapy

## 2023-12-14 DIAGNOSIS — M545 Low back pain, unspecified: Secondary | ICD-10-CM

## 2023-12-14 DIAGNOSIS — M6281 Muscle weakness (generalized): Secondary | ICD-10-CM

## 2023-12-14 DIAGNOSIS — R293 Abnormal posture: Secondary | ICD-10-CM | POA: Diagnosis not present

## 2023-12-14 NOTE — Therapy (Signed)
 OUTPATIENT PHYSICAL THERAPY THORACOLUMBAR TREATMENT   Patient Name: Terry Edwards MRN: [id number] DOB:02-16-1936, 88 y.o., female Today's Date: 12/14/2023   END OF SESSION:  PT End of Session - 12/14/23 0934     Visit Number 11    Date for PT Re-Evaluation 01/02/24    Authorization Type [employer]    Progress Note Due on Visit 20    PT Start Time 0931    PT Stop Time 1015    PT Time Calculation (min) 44 min    Activity Tolerance Patient tolerated treatment well                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   Cataract    removed bilateral eyes   Chronic hepatitis B without hepatic coma (HCC) 1970s   chronic elev AFP   Cyst of pineal gland    incidental on MRI   Heart murmur    Hemorrhoids    History of chicken pox    History of depression 2006   situational, on Lexapro for 1 year   Hyperlipidemia    Osteopenia    hx fx 5th MT R foot 03/2013   Plantar fasciitis of left foot    Per [facility] new patient packet   Right rotator cuff tendonitis 2013   resolved with PT (injections not helpful)   Urine incontinence    Vitamin D deficiency    Past Surgical History:  Procedure Laterality Date   APPENDECTOMY  1957   BLEPHAROPLASTY     Per [facility] new patient packet   COLONOSCOPY     EYE SURGERY Bilateral 03/09/15, 03/23/15   cataract removal   TONSILLECTOMY  1940's   Patient Active Problem List   Diagnosis Date Noted   Hardening of the aorta (main artery of the heart) (HCC) 06/14/2023   Thoracic aortic aneurysm (HCC) 07/27/2022   Elevated blood pressure reading without diagnosis of hypertension 11/13/2021   Right hand pain 11/13/2021   Impingement syndrome of shoulder region 10/25/2021   AF (amaurosis fugax) 07/09/2021   Drug-induced skin rash 04/23/2021   Cyst of joint of hand, right 01/26/2021   Thinning hair 10/12/2020   Murmur,  cardiac 10/12/2020   Traumatic incomplete tear of right rotator cuff 10/12/2020   Urge incontinence 10/12/2020   Routine general medical examination at a health care facility 01/09/2015   Chronic toe pain, right foot 09/03/2014   Cyst of pineal gland    Vitamin D deficiency    Osteoporosis    Hx of type B viral hepatitis    Hyperlipidemia     PCP: Jackelyn Hoehn MD  REFERRING PROVIDER: Jackelyn Hoehn MD  REFERRING DIAG: M54.50 acute right sided low back pain without sciatica  Rationale for Evaluation and Treatment: Rehabilitation  THERAPY DIAG:  Back pain; weakness ONSET DATE: 3 months  SUBJECTIVE:  SUBJECTIVE STATEMENT: Achiness right buttock, ischial tuberosity  Intake history from 11/06/22:  Symptoms began 2-3 months ago for no apparent reason.  Pain in bil low back, right SI region, right posterior thigh (upper to mid way) Does upper body weights at home, doesn't do lower body per se Had previous pelvic floor PT at this facility Had acupuncture (helpful)   PERTINENT HISTORY:  Osteoporosis; thoracic aortic aneurysm; bil foot neuropathy (numbness); right rotator cuff tear; wrist fracture  PAIN:   Are you having pain? Yes NPRS scale: 2/10 right lateral thigh, buttock Pain location: right buttock, distal lateral HS, ITB, low back Pain orientation: Right  PAIN TYPE: aching Pain description: intermittent  Aggravating factors: as the day goes on, sitting on a hard chair, sitting with legs crossed, sometimes walking; sidelying with legs bent Relieving factors: Tylenol, AM; PRECAUTIONS: None  WEIGHT BEARING RESTRICTIONS: No  FALLS:  Has patient fallen in last 6 months? No LIVING ENVIRONMENT: Lives with: lives alone Lives in: House/apartment OCCUPATION: retired  PLOF:  Independent  PATIENT GOALS: get rid of this discomfort, be able to walk without pain;  (sitting) do art work, sit for reading , cooking  NEXT MD VISIT: as needed   OBJECTIVE:  Note: Objective measures were completed at Evaluation unless otherwise noted.  DIAGNOSTIC FINDINGS:  2022: Results:   Lumbar spine L1-L4 Femoral neck (FN) 33% distal radius  T-score -2.6 RFN: -1.6 LFN: -2.0 n/a  Change in BMD from previous DXA test (%) Down 0.7% Down 2.0% n/a  (*) statistically significant  PATIENT SURVEYS:  Modified Oswestry 10%   COGNITION: Overall cognitive status: Within functional limits for tasks assessed     MUSCLE LENGTH:  neural tension right with slump test and SLR;  decreased right hip flexor length  POSTURE: decreased lumbar lordosis  PALPATION: Tender points in gluteals and piriformis on right; minimal tenderness ischial tuberosity and proximal HS  LUMBAR ROM:   AROM eval 3/11  Flexion WFLs WFLS  Extension 15  20  Right lateral flexion 20 25  Left lateral flexion 20 25  Right rotation    Left rotation     (Blank rows = not tested)  TRUNK STRENGTH:  Decreased activation of transverse abdominus muscles; abdominals 4-/5; decreased activation of lumbar multifidi; trunk extensors 4-/5   LOWER EXTREMITY ROM:   WFLs   LOWER EXTREMITY MMT:  decreased right hip abduction 4-/5; left SLS 8 sec with ease, right SLS 5 sec with pelvic drop and inc difficulty stabilizing 3/4: right hip abduction 4/5 3/11: right hip abduction 4/5  LUMBAR SPECIAL TESTS:  + neural tension on right with slump test and SLR  FUNCTIONAL TESTS:  5x STS no Ues 10.3 sec  GAIT: Comments:   TREATMENT DATE:  12/14/23 Comprehensive review of HEP and determination of which ex's to focus on, which to select based on symptom irritability and which are more advanced Hip extension isometric against wall 5sec hold 5x right/left 4 inch lateral step up/down 15x right/left single hand support  4 inch  forward step up 5 sec hold at the top 20x right/left No pain following ex's Therapeutic activity: walking, standing, steps, curbs Discussion on limiting sitting while traveling to [place]: airport ex's, lumbar roll  12/12/23 Review of current HEP and technique (back of the shoe push rather than bottom of shoe) Response to treatment and progress toward goals Manual therapy: soft tissue mobilization to right gluteals, right lumbar, right ITB, right lateral HS Trigger Point Dry Needling Subsequent Treatment: Instructions provided previously  at initial dry needling treatment.  Patient Verbal Consent Given: Yes Education Handout Provided: Previously Provided Muscles Treated: prone right gluteals, right lumbar multifidi, right ITB, right lateral HS Electrical Stimulation Performed: yes; 80 pps, 1.5 ma 20 min to muscles above Treatment Response/Outcome: Utilized skilled palpation to identify bony landmarks and trigger points.  Able to illicit twitch response and muscle elongation.   12/07/23 Supine double bridge 5x Single leg bridge with towel roll hold in bend of the hip 2 sets of 5 (added to HEP) 4 inch lateral step up/down 15x right/left single hand support  Single leg [place] dead lift with 5# reach down to knee level stool 10x right/left 2 finger and 1 finger support (added weight to HEP) 4 inch forward step up 5 sec hold at the top 10x  WB on right with left hip flexion, holding 5# weight left hand 15x (added to HEP) Staggered standing with heavy pink loop around ankles isometric hip extension 5 sec hold 10x right/left (pain 1-2/10)   12/05/23 Review of current HEP and response Discussion of upcoming flight to [facility] Manual therapy: soft tissue mobilization to right gluteals, right lumbar, right ITB, right lateral HS Trigger Point Dry Needling Subsequent Treatment: Instructions provided previously at initial dry needling treatment.  Patient Verbal Consent Given: Yes Education Handout  Provided: Previously Provided Muscles Treated: prone right gluteals, right lumbar multifidi, right ITB, right lateral HS Electrical Stimulation Performed: yes; 80 pps, 1.5 ma 20 min to muscles above Treatment Response/Outcome: Utilized skilled palpation to identify bony landmarks and trigger points.  Able to illicit twitch response and muscle elongation.    PATIENT EDUCATION:  Education details: Educated patient on anatomy and physiology of current symptoms, prognosis, plan of care as well as initial self care strategies to promote recovery Person educated: Patient Education method: Explanation Education comprehension: verbalized understanding  HOME EXERCISE PROGRAM: Access Code: [email or web address] URL: [email or web address] Date: 12/07/2023 Prepared by: Lavinia Sharps  Exercises - Clamshell with Resistance  - 1 x daily - 7 x weekly - 1 sets - 5 hold - Supine Bridge with Resistance Band  - 1 x daily - 7 x weekly - 1 sets - 7-10 reps - Supine March  - 1 x daily - 7 x weekly - 1 sets - 7-10 reps - Sit to Stand  - 1 x daily - 7 x weekly - 2 sets - 5 reps - Hooklying Single Knee to Chest Stretch  - 1 x daily - 7 x weekly - 1 sets - 10 reps - 5 hold - Supine Piriformis Stretch with Foot on Ground  - 1 x daily - 7 x weekly - 1 sets - 2 reps - 30 hold - Supine Sciatic Nerve Glide  - 1 x daily - 7 x weekly - 2 sets - 10 reps - Supine Lower Trunk Rotation  - 1 x daily - 7 x weekly - 1 sets - 10 reps - Modified Thomas Stretch  - 1 x daily - 7 x weekly - 1 sets - 2 reps - 30 hold - Supine Posterior Pelvic Tilt  - 1 x daily - 7 x weekly - 1 sets - 10 reps - wall isometrics  - 1 x daily - 7 x weekly - 2 sets - 5 reps - 5 hold - Standing Isometric Hip Abduction with Ball on Wall  - 1 x daily - 7 x weekly - 2 sets - 5 reps - 5 hold - Hooklying Isometric Hip Flexion with Opposite Arm  -  1 x daily - 7 x weekly - 2 sets - 5 reps - 5 hold - Supine Pelvic Tilt with Straight Leg Raise  - 1 x daily - 3  x weekly - 2 sets - 10 reps - Hooklying Sequential Leg March and Lower  - 1 x daily - 3 x weekly - 2 sets - 10 reps - Standing Isometric Hip Flexion with Ball at [employer] (Mirrored)  - 1 x daily - 7 x weekly - 2 sets - 10 reps - 5-10 sec hold - Prone Hip Extension with Pillow Under Abdomen  - 1 x daily - 3 x weekly - 2 sets - 10 reps - Prone Gluteal Sets  - 1 x daily - 7 x weekly - 10 reps - 3 hold - Standing Single Leg Stance with Counter Support  - 1 x daily - 7 x weekly - 1 sets - 10 reps - 5 hold - Standing 4-Way Leg Reach with Counter Support  - 1 x daily - 7 x weekly - 1 sets - 5 reps - Lateral Step Up with Counter Support  - 1 x daily - 7 x weekly - 1 sets - 10 reps - Forward T with Counter Support  - 1 x daily - 7 x weekly - 1 sets - 10 reps - Single Leg Bridge  - 1 x daily - 7 x weekly - 2 sets - 5 reps - Standing Marching  - 1 x daily - 7 x weekly - 1 sets - 15 reps ASSESSMENT:  CLINICAL IMPRESSION:   Kannon remains highly compliant with her HEP with improves long term prognosis.  We did a comprehensive review of her HEP to update and progress based on symptoms.  Emphasized glute strengthening particularly isometrics which she has responded very well to even when symptoms are irritable. We discussed strategies for her upcoming trip to [place] and how to adjust exercises as needed.     OBJECTIVE IMPAIRMENTS: decreased activity tolerance, difficulty walking, decreased strength, increased fascial restrictions, impaired perceived functional ability, and pain.   ACTIVITY LIMITATIONS: sitting, sleeping, and locomotion level  PARTICIPATION LIMITATIONS: meal prep, cleaning, laundry, driving, and community activity  PERSONAL FACTORS: 1-2 comorbidities: osteoporosis, bil neuropathy in feet  are also affecting patient's functional outcome.   REHAB POTENTIAL: Good  CLINICAL DECISION MAKING: Stable/uncomplicated  EVALUATION COMPLEXITY: Low   GOALS: Goals reviewed with patient?  Yes  SHORT TERM GOALS: Target date: 12/05/2023    The patient will demonstrate knowledge of basic self care strategies and exercises to promote healing  Baseline: Goal status: met 3/11  2.  The patient will report a 40% improvement in pain levels with functional activities which are currently difficult including walking, sitting Baseline:  Goal status: met 3/11  3.  The patient will have improved hip strength to at least 4/5 needed for standing, walking longer distances and descending stairs at home and in the community  Baseline:  Goal status: met 3/4    LONG TERM GOALS: Target date: 01/02/2024   The patient will be independent in a safe self progression of a home exercise program to promote further recovery of function   Baseline:  Goal status: INITIAL  2.  The patient will report a 75% improvement in pain levels with functional activities which are currently difficult including walking, sitting, as the day goes on Baseline:  Goal status: met 3/11  3.  The patient will have improved hip strength to at least 4+/5 needed for standing, walking longer  distances and descending stairs at home and in the community  Baseline:  Goal status: INITIAL  4.  Able to walk 2.5 miles with pain level 2/10 Baseline:  Goal status: met 3/11 can do 3 miles a little achy after  5.  Modified Oswestry score improved to 7% Baseline:  Goal status: INITIAL   PLAN:  PT FREQUENCY: 2x/week  PT DURATION: 8 weeks  PLANNED INTERVENTIONS: 97164- PT Re-evaluation, 97110-Therapeutic exercises, 97530- Therapeutic activity, 97112- Neuromuscular re-education, 97535- Self Care, 09811- Manual therapy, [id number]- Aquatic Therapy, 97014- Electrical stimulation (unattended), [id number]- Electrical stimulation (manual), [id number]- Vasopneumatic device, [id number]- Ultrasound, [id number]- Traction (mechanical), [id number]- Ionotophoresis 4mg /ml Dexamethasone, Patient/Family education, Balance training, Stair training, Taping, Dry Needling,  Joint mobilization, Spinal mobilization, Cryotherapy, and Moist heat.  PLAN FOR NEXT SESSION: reduce treatment to biweekly after trip;  DN with ES;   Rt LE  4";  weighted to single leg [place] dead lift to stool height;   emphasis on Rt gluteal strength (has + Trendelenburg and hip abd/ext weakness); going to [place];  has 3# and 5# at home; looking for a small step  Lavinia Sharps, PT 12/14/23 6:44 PM Phone: [phone] Fax: [phone]

## 2023-12-19 ENCOUNTER — Encounter: Payer: Medicare HMO | Admitting: Physical Therapy

## 2023-12-21 ENCOUNTER — Encounter: Payer: Medicare HMO | Admitting: Physical Therapy

## 2023-12-26 ENCOUNTER — Encounter: Payer: Medicare HMO | Admitting: Physical Therapy

## 2023-12-28 ENCOUNTER — Encounter: Payer: Medicare HMO | Admitting: Physical Therapy

## 2023-12-29 ENCOUNTER — Ambulatory Visit: Payer: Medicare HMO | Admitting: Physical Therapy

## 2023-12-29 DIAGNOSIS — M6281 Muscle weakness (generalized): Secondary | ICD-10-CM | POA: Diagnosis not present

## 2023-12-29 DIAGNOSIS — R293 Abnormal posture: Secondary | ICD-10-CM | POA: Diagnosis not present

## 2023-12-29 DIAGNOSIS — M545 Low back pain, unspecified: Secondary | ICD-10-CM

## 2023-12-29 NOTE — Therapy (Signed)
 OUTPATIENT PHYSICAL THERAPY THORACOLUMBAR TREATMENT   Patient Name: Terry Edwards [place] MRN: [id number] DOB:[birth year], 88 y.o., female Today's Date: 12/29/2023   END OF SESSION:  PT End of Session - 12/29/23 1017     Visit Number 12    Date for PT Re-Evaluation 01/02/24    Authorization Type [employer]    Progress Note Due on Visit 20    PT Start Time 1015    PT Stop Time 1058    PT Time Calculation (min) 43 min    Activity Tolerance Patient tolerated treatment well                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   Cataract    removed bilateral eyes   Chronic hepatitis B without hepatic coma (HCC) 1970s   chronic elev AFP   Cyst of pineal gland    incidental on MRI   Heart murmur    Hemorrhoids    History of chicken pox    History of depression 2006   situational, on Lexapro for 1 year   Hyperlipidemia    Osteopenia    hx fx 5th MT R foot 03/2013   Plantar fasciitis of left foot    Per [facility] new patient packet   Right rotator cuff tendonitis 2013   resolved with PT (injections not helpful)   Urine incontinence    Vitamin D deficiency    Past Surgical History:  Procedure Laterality Date   APPENDECTOMY  1957   BLEPHAROPLASTY     Per [facility] new patient packet   COLONOSCOPY     EYE SURGERY Bilateral 03/09/15, 03/23/15   cataract removal   TONSILLECTOMY  1940's   Patient Active Problem List   Diagnosis Date Noted   Hardening of the aorta (main artery of the heart) (HCC) 06/14/2023   Thoracic aortic aneurysm (HCC) 07/27/2022   Elevated blood pressure reading without diagnosis of hypertension 11/13/2021   Right hand pain 11/13/2021   Impingement syndrome of shoulder region 10/25/2021   AF (amaurosis fugax) 07/09/2021   Drug-induced skin rash 04/23/2021   Cyst of joint of hand, right 01/26/2021   Thinning hair 10/12/2020   Murmur,  cardiac 10/12/2020   Traumatic incomplete tear of right rotator cuff 10/12/2020   Urge incontinence 10/12/2020   Routine general medical examination at a health care facility 01/09/2015   Chronic toe pain, right foot 09/03/2014   Cyst of pineal gland    Vitamin D deficiency    Osteoporosis    Hx of type B viral hepatitis    Hyperlipidemia     PCP: Jackelyn Hoehn MD  REFERRING PROVIDER: Jackelyn Hoehn MD  REFERRING DIAG: M54.50 acute right sided low back pain without sciatica  Rationale for Evaluation and Treatment: Rehabilitation  THERAPY DIAG:  Back pain; weakness ONSET DATE: 3 months  SUBJECTIVE:  SUBJECTIVE STATEMENT: Trip went OK, my lower back gets tired later on.  Walked 16 miles in 4 days. The pain is mostly low buttock (ischial tuberosity)  Intake history from 11/06/22:  Symptoms began 2-3 months ago for no apparent reason.  Pain in bil low back, right SI region, right posterior thigh (upper to mid way) Does upper body weights at home, doesn't do lower body per se Had previous pelvic floor PT at this facility Had acupuncture (helpful)   PERTINENT HISTORY:  Osteoporosis; thoracic aortic aneurysm; bil foot neuropathy (numbness); right rotator cuff tear; wrist fracture  PAIN:   Are you having pain? Yes NPRS scale: 1-2/10 right lateral thigh, buttock Pain location: right buttock, distal lateral HS, ITB, low back Pain orientation: Right  PAIN TYPE: aching Pain description: intermittent  Aggravating factors: as the day goes on, sitting on a hard chair, sitting with legs crossed, sometimes walking; sidelying with legs bent Relieving factors: Tylenol, AM; PRECAUTIONS: None  WEIGHT BEARING RESTRICTIONS: No  FALLS:  Has patient fallen in last 6 months? No LIVING  ENVIRONMENT: Lives with: lives alone Lives in: House/apartment OCCUPATION: retired  PLOF: Independent  PATIENT GOALS: get rid of this discomfort, be able to walk without pain;  (sitting) do art work, sit for reading , cooking  NEXT MD VISIT: as needed   OBJECTIVE:  Note: Objective measures were completed at Evaluation unless otherwise noted.  DIAGNOSTIC FINDINGS:  2022: Results:   Lumbar spine L1-L4 Femoral neck (FN) 33% distal radius  T-score -2.6 RFN: -1.6 LFN: -2.0 n/a  Change in BMD from previous DXA test (%) Down 0.7% Down 2.0% n/a  (*) statistically significant  PATIENT SURVEYS:  Modified Oswestry 10%   COGNITION: Overall cognitive status: Within functional limits for tasks assessed     MUSCLE LENGTH:  neural tension right with slump test and SLR;  decreased right hip flexor length  POSTURE: decreased lumbar lordosis  PALPATION: Tender points in gluteals and piriformis on right; minimal tenderness ischial tuberosity and proximal HS  LUMBAR ROM:   AROM eval 3/11  Flexion WFLs WFLS  Extension 15  20  Right lateral flexion 20 25  Left lateral flexion 20 25  Right rotation    Left rotation     (Blank rows = not tested)  TRUNK STRENGTH:  Decreased activation of transverse abdominus muscles; abdominals 4-/5; decreased activation of lumbar multifidi; trunk extensors 4-/5   LOWER EXTREMITY ROM:   WFLs   LOWER EXTREMITY MMT:  decreased right hip abduction 4-/5; left SLS 8 sec with ease, right SLS 5 sec with pelvic drop and inc difficulty stabilizing 3/4: right hip abduction 4/5 3/11: right hip abduction 4/5  LUMBAR SPECIAL TESTS:  + neural tension on right with slump test and SLR  FUNCTIONAL TESTS:  5x STS no Ues 10.3 sec    TREATMENT DATE:  12/29/23 Discussion on response from recent trip to [place] Discussion on plan of care  Manual therapy: soft tissue mobilization to right gluteals, right lumbar, right ITB, right lateral HS Trigger Point Dry  Needling Subsequent Treatment: Instructions provided previously at initial dry needling treatment.  Patient Verbal Consent Given: Yes Education Handout Provided: Previously Provided Muscles Treated: prone right gluteals, right lumbar multifidi, right ITB, right lateral HS Electrical Stimulation Performed: yes; 80 pps, 1.5 ma 20 min to muscles above Treatment Response/Outcome: Utilized skilled palpation to identify bony landmarks and trigger points.  Able to illicit twitch response and muscle elongation.   12/14/23 Comprehensive review of HEP and determination  of which ex's to focus on, which to select based on symptom irritability and which are more advanced Hip extension isometric against wall 5sec hold 5x right/left 4 inch lateral step up/down 15x right/left single hand support  4 inch forward step up 5 sec hold at the top 20x right/left No pain following ex's Therapeutic activity: walking, standing, steps, curbs Discussion on limiting sitting while traveling to [place]: airport ex's, lumbar roll  12/12/23 Review of current HEP and technique (back of the shoe push rather than bottom of shoe) Response to treatment and progress toward goals Manual therapy: soft tissue mobilization to right gluteals, right lumbar, right ITB, right lateral HS Trigger Point Dry Needling Subsequent Treatment: Instructions provided previously at initial dry needling treatment.  Patient Verbal Consent Given: Yes Education Handout Provided: Previously Provided Muscles Treated: prone right gluteals, right lumbar multifidi, right ITB, right lateral HS Electrical Stimulation Performed: yes; 80 pps, 1.5 ma 20 min to muscles above Treatment Response/Outcome: Utilized skilled palpation to identify bony landmarks and trigger points.  Able to illicit twitch response and muscle elongation.   12/07/23 Supine double bridge 5x Single leg bridge with towel roll hold in bend of the hip 2 sets of 5 (added to HEP) 4 inch  lateral step up/down 15x right/left single hand support  Single leg [place] dead lift with 5# reach down to knee level stool 10x right/left 2 finger and 1 finger support (added weight to HEP) 4 inch forward step up 5 sec hold at the top 10x  WB on right with left hip flexion, holding 5# weight left hand 15x (added to HEP) Staggered standing with heavy pink loop around ankles isometric hip extension 5 sec hold 10x right/left (pain 1-2/10)     PATIENT EDUCATION:  Education details: Educated patient on anatomy and physiology of current symptoms, prognosis, plan of care as well as initial self care strategies to promote recovery Person educated: Patient Education method: Explanation Education comprehension: verbalized understanding  HOME EXERCISE PROGRAM: Access Code: [email or web address] URL: [email or web address] Date: 12/07/2023 Prepared by: Lavinia Sharps  Exercises - Clamshell with Resistance  - 1 x daily - 7 x weekly - 1 sets - 5 hold - Supine Bridge with Resistance Band  - 1 x daily - 7 x weekly - 1 sets - 7-10 reps - Supine March  - 1 x daily - 7 x weekly - 1 sets - 7-10 reps - Sit to Stand  - 1 x daily - 7 x weekly - 2 sets - 5 reps - Hooklying Single Knee to Chest Stretch  - 1 x daily - 7 x weekly - 1 sets - 10 reps - 5 hold - Supine Piriformis Stretch with Foot on Ground  - 1 x daily - 7 x weekly - 1 sets - 2 reps - 30 hold - Supine Sciatic Nerve Glide  - 1 x daily - 7 x weekly - 2 sets - 10 reps - Supine Lower Trunk Rotation  - 1 x daily - 7 x weekly - 1 sets - 10 reps - Modified Thomas Stretch  - 1 x daily - 7 x weekly - 1 sets - 2 reps - 30 hold - Supine Posterior Pelvic Tilt  - 1 x daily - 7 x weekly - 1 sets - 10 reps - wall isometrics  - 1 x daily - 7 x weekly - 2 sets - 5 reps - 5 hold - Standing Isometric Hip Abduction with Ball on Wall  - 1 x daily -  7 x weekly - 2 sets - 5 reps - 5 hold - Hooklying Isometric Hip Flexion with Opposite Arm  - 1 x daily - 7 x weekly -  2 sets - 5 reps - 5 hold - Supine Pelvic Tilt with Straight Leg Raise  - 1 x daily - 3 x weekly - 2 sets - 10 reps - Hooklying Sequential Leg March and Lower  - 1 x daily - 3 x weekly - 2 sets - 10 reps - Standing Isometric Hip Flexion with Ball at [employer] (Mirrored)  - 1 x daily - 7 x weekly - 2 sets - 10 reps - 5-10 sec hold - Prone Hip Extension with Pillow Under Abdomen  - 1 x daily - 3 x weekly - 2 sets - 10 reps - Prone Gluteal Sets  - 1 x daily - 7 x weekly - 10 reps - 3 hold - Standing Single Leg Stance with Counter Support  - 1 x daily - 7 x weekly - 1 sets - 10 reps - 5 hold - Standing 4-Way Leg Reach with Counter Support  - 1 x daily - 7 x weekly - 1 sets - 5 reps - Lateral Step Up with Counter Support  - 1 x daily - 7 x weekly - 1 sets - 10 reps - Forward T with Counter Support  - 1 x daily - 7 x weekly - 1 sets - 10 reps - Single Leg Bridge  - 1 x daily - 7 x weekly - 2 sets - 5 reps - Standing Marching  - 1 x daily - 7 x weekly - 1 sets - 15 reps ASSESSMENT:  CLINICAL IMPRESSION: The patient benefits significantly from dry needling combined with ES and manual therapy to stimulate underlying myofascial trigger points and muscular tissue for management of neuromusculoskeletal pain and address movement impairments.  The patient is highly compliant with HEP which enhances long term benefit. Will reassess progress toward goals next visit and plan to decrease frequency of visits.   OBJECTIVE IMPAIRMENTS: decreased activity tolerance, difficulty walking, decreased strength, increased fascial restrictions, impaired perceived functional ability, and pain.   ACTIVITY LIMITATIONS: sitting, sleeping, and locomotion level  PARTICIPATION LIMITATIONS: meal prep, cleaning, laundry, driving, and community activity  PERSONAL FACTORS: 1-2 comorbidities: osteoporosis, bil neuropathy in feet  are also affecting patient's functional outcome.   REHAB POTENTIAL: Good  CLINICAL DECISION MAKING:  Stable/uncomplicated  EVALUATION COMPLEXITY: Low   GOALS: Goals reviewed with patient? Yes  SHORT TERM GOALS: Target date: 12/05/2023    The patient will demonstrate knowledge of basic self care strategies and exercises to promote healing  Baseline: Goal status: met 3/11  2.  The patient will report a 40% improvement in pain levels with functional activities which are currently difficult including walking, sitting Baseline:  Goal status: met 3/11  3.  The patient will have improved hip strength to at least 4/5 needed for standing, walking longer distances and descending stairs at home and in the community  Baseline:  Goal status: met 3/4    LONG TERM GOALS: Target date: 01/02/2024     The patient will be independent in a safe self progression of a home exercise program to promote further recovery of function   Baseline:  Goal status: INITIAL  2.  The patient will report a 75% improvement in pain levels with functional activities which are currently difficult including walking, sitting, as the day goes on Baseline:  Goal status: met 3/11  3.  The patient will have improved hip strength to at least 4+/5 needed for standing, walking longer distances and descending stairs at home and in the community  Baseline:  Goal status: INITIAL  4.  Able to walk 2.5 miles with pain level 2/10 Baseline:  Goal status: met 3/11 can do 3 miles a little achy after  5.  Modified Oswestry score improved to 7% Baseline:  Goal status: INITIAL   PLAN:  PT FREQUENCY: 2x/week  PT DURATION: 8 weeks  PLANNED INTERVENTIONS: 97164- PT Re-evaluation, 97110-Therapeutic exercises, 97530- Therapeutic activity, 97112- Neuromuscular re-education, 97535- Self Care, [id number]- Manual therapy, [id number]- Aquatic Therapy, 97014- Electrical stimulation (unattended), [id number]- Electrical stimulation (manual), [id number]- Vasopneumatic device, [id number]- Ultrasound, [id number]- Traction (mechanical), [id number]- Ionotophoresis 4mg /ml  Dexamethasone, Patient/Family education, Balance training, Stair training, Taping, Dry Needling, Joint mobilization, Spinal mobilization, Cryotherapy, and Moist heat.  PLAN FOR NEXT SESSION: ERO; reduce treatment to biweekly;  DN with ES;   Rt LE  4";  weighted to single leg [place] dead lift to stool height;   emphasis on Rt gluteal strength (has + Trendelenburg and hip abd/ext weakness);   has 3# and 5# at home; looking for a small step  Lavinia Sharps, PT 12/29/23 11:16 AM Phone: [phone] Fax: [phone]

## 2024-01-02 ENCOUNTER — Ambulatory Visit: Payer: Medicare HMO | Attending: Sports Medicine | Admitting: Physical Therapy

## 2024-01-02 DIAGNOSIS — M6281 Muscle weakness (generalized): Secondary | ICD-10-CM | POA: Diagnosis not present

## 2024-01-02 DIAGNOSIS — M545 Low back pain, unspecified: Secondary | ICD-10-CM | POA: Diagnosis not present

## 2024-01-02 NOTE — Therapy (Signed)
 OUTPATIENT PHYSICAL THERAPY THORACOLUMBAR TREATMENT/RECERTIFICATION   Patient Name: Terry Edwards MRN: [id number] DOB:1935-12-12, 88 y.o., female Today's Date: 01/02/2024   END OF SESSION:  PT End of Session - 01/02/24 0934     Visit Number 13    Date for PT Re-Evaluation 03/26/24    Authorization Type [employer]    Progress Note Due on Visit 20    PT Start Time 0930    PT Stop Time 1012    PT Time Calculation (min) 42 min    Activity Tolerance Patient tolerated treatment well                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   Cataract    removed bilateral eyes   Chronic hepatitis B without hepatic coma (HCC) 1970s   chronic elev AFP   Cyst of pineal gland    incidental on MRI   Heart murmur    Hemorrhoids    History of chicken pox    History of depression 2006   situational, on Lexapro for 1 year   Hyperlipidemia    Osteopenia    hx fx 5th MT R foot 03/2013   Plantar fasciitis of left foot    Per [facility] new patient packet   Right rotator cuff tendonitis 2013   resolved with PT (injections not helpful)   Urine incontinence    Vitamin D deficiency    Past Surgical History:  Procedure Laterality Date   APPENDECTOMY  1957   BLEPHAROPLASTY     Per [facility] new patient packet   COLONOSCOPY     EYE SURGERY Bilateral 03/09/15, 03/23/15   cataract removal   TONSILLECTOMY  1940's   Patient Active Problem List   Diagnosis Date Noted   Hardening of the aorta (main artery of the heart) (HCC) 06/14/2023   Thoracic aortic aneurysm (HCC) 07/27/2022   Elevated blood pressure reading without diagnosis of hypertension 11/13/2021   Right hand pain 11/13/2021   Impingement syndrome of shoulder region 10/25/2021   AF (amaurosis fugax) 07/09/2021   Drug-induced skin rash 04/23/2021   Cyst of joint of hand, right 01/26/2021   Thinning hair 10/12/2020    Murmur, cardiac 10/12/2020   Traumatic incomplete tear of right rotator cuff 10/12/2020   Urge incontinence 10/12/2020   Routine general medical examination at a health care facility 01/09/2015   Chronic toe pain, right foot 09/03/2014   Cyst of pineal gland    Vitamin D deficiency    Osteoporosis    Hx of type B viral hepatitis    Hyperlipidemia     PCP: Jackelyn Hoehn MD  REFERRING PROVIDER: Jackelyn Hoehn MD  REFERRING DIAG: M54.50 acute right sided low back pain without sciatica  Rationale for Evaluation and Treatment: Rehabilitation  THERAPY DIAG:  Back pain; weakness ONSET DATE: 3 months  SUBJECTIVE:  SUBJECTIVE STATEMENT: Doing at least 5 exercises/day.  One leg bridge is challenging. I don't have a step at home to do the side step ups. I can cross my legs now (couldn't do that before) and I can sit a little longer.   Intake history from 11/06/22:  Symptoms began 2-3 months ago for no apparent reason.  Pain in bil low back, right SI region, right posterior thigh (upper to mid way) Does upper body weights at home, doesn't do lower body per se Had previous pelvic floor PT at this facility Had acupuncture (helpful)   PERTINENT HISTORY:  Osteoporosis; thoracic aortic aneurysm; bil foot neuropathy (numbness); right rotator cuff tear; wrist fracture  PAIN:   Are you having pain? Yes NPRS scale: 1-2/10 right lateral thigh, buttock Pain location: right buttock, distal lateral HS, ITB, low back Pain orientation: Right  PAIN TYPE: aching Pain description: intermittent  Aggravating factors: as the day goes on, sitting on a hard chair, sitting with legs crossed, sometimes walking; sidelying with legs bent Relieving factors: Tylenol, AM; PRECAUTIONS: None  WEIGHT BEARING  RESTRICTIONS: No  FALLS:  Has patient fallen in last 6 months? No LIVING ENVIRONMENT: Lives with: lives alone Lives in: House/apartment OCCUPATION: retired  PLOF: Independent  PATIENT GOALS: get rid of this discomfort, be able to walk without pain;  (sitting) do art work, sit for reading , cooking  NEXT MD VISIT: as needed   OBJECTIVE:  Note: Objective measures were completed at Evaluation unless otherwise noted.  DIAGNOSTIC FINDINGS:  2022: Results:   Lumbar spine L1-L4 Femoral neck (FN) 33% distal radius  T-score -2.6 RFN: -1.6 LFN: -2.0 n/a  Change in BMD from previous DXA test (%) Down 0.7% Down 2.0% n/a  (*) statistically significant  PATIENT SURVEYS:  Modified Oswestry 10%  01/02/24: 18%  COGNITION: Overall cognitive status: Within functional limits for tasks assessed     MUSCLE LENGTH:  neural tension right with slump test and SLR;  decreased right hip flexor length  POSTURE: decreased lumbar lordosis  PALPATION: Tender points in gluteals and piriformis on right; minimal tenderness ischial tuberosity and proximal HS  LUMBAR ROM:   AROM eval 3/11 4/1  Flexion WFLs WFLS Fingers 6 inches from toes  Extension 15  20 20   Right lateral flexion 20 25 30   Left lateral flexion 20 25 25   Right rotation     Left rotation      (Blank rows = not tested)  TRUNK STRENGTH:  Decreased activation of transverse abdominus muscles; abdominals 4-/5; decreased activation of lumbar multifidi; trunk extensors 4-/5   LOWER EXTREMITY ROM:   WFLs   LOWER EXTREMITY MMT:  decreased right hip abduction 4-/5; left SLS 8 sec with ease, right SLS 5 sec with pelvic drop and inc difficulty stabilizing 3/4: right hip abduction 4/5 3/11: right hip abduction 4/5 4/1: grossly 4/5: SLS 45 sec on right with pelvic drop and knee internal rotation in last 10 sec; left 29 sec with internal rotation and more difficulty stabilizing  LUMBAR SPECIAL TESTS:  + neural tension on right with slump test  and SLR  FUNCTIONAL TESTS:  5x STS no Ues 10.3 sec 4/1:  8.18 sec  The Patient-Specific Functional Scale  Initial:  I am going to ask you to identify up to 3 important activities that you are unable to do or are having difficulty with as a result of this problem.  Today are there any activities that you are unable to do or having difficulty with  because of this?  (Patient shown scale and patient rated each activity)  Follow up: When you first came in you had difficulty performing these activities.  Today do you still have difficulty?  Patient-Specific activity scoring scheme (Point to one number):  0 1 2 3 4 5 6 7 8 9  10 Unable                                                                                                          Able to perform To perform                                                                                                    activity at the same Activity         Level as before                                                                                                                       Injury or problem  Activity       Working/sitting at the computer                                                                          Initial:        7                 2.            Standing to cook                                                                      Initial:  7                3.                  Walking 2-3 miles                                                                Initial:         7                   TREATMENT DATE:  01/02/24 Modified Oswestry PSFS SLS right/left Lumbar ROM 4 inch lateral step ups with 5 sec hold 15x right/left Discussed progression to single leg RDLs to a heavier weight Discussion on plan of care  Manual therapy: soft tissue mobilization to right gluteals, right lumbar, right ITB, right lateral HS Trigger Point Dry Needling Subsequent Treatment: Instructions provided previously at initial dry  needling treatment.  Patient Verbal Consent Given: Yes Education Handout Provided: Previously Provided Muscles Treated: prone right gluteals, right lumbar multifidi, right ITB, right lateral HS Electrical Stimulation Performed: yes; 80 pps, 1.5 ma 8 min to muscles above Treatment Response/Outcome: Utilized skilled palpation to identify bony landmarks and trigger points.  Able to illicit twitch response and muscle elongation.   12/29/23 Discussion on response from recent trip to [place] Discussion on plan of care  Manual therapy: soft tissue mobilization to right gluteals, right lumbar, right ITB, right lateral HS Trigger Point Dry Needling Subsequent Treatment: Instructions provided previously at initial dry needling treatment.  Patient Verbal Consent Given: Yes Education Handout Provided: Previously Provided Muscles Treated: prone right gluteals, right lumbar multifidi, right ITB, right lateral HS Electrical Stimulation Performed: yes; 80 pps, 1.5 ma 20 min to muscles above Treatment Response/Outcome: Utilized skilled palpation to identify bony landmarks and trigger points.  Able to illicit twitch response and muscle elongation.   12/14/23 Comprehensive review of HEP and determination of which ex's to focus on, which to select based on symptom irritability and which are more advanced Hip extension isometric against wall 5sec hold 5x right/left 4 inch lateral step up/down 15x right/left single hand support  4 inch forward step up 5 sec hold at the top 20x right/left No pain following ex's Therapeutic activity: walking, standing, steps, curbs Discussion on limiting sitting while traveling to [place]: airport ex's, lumbar roll  12/12/23 Review of current HEP and technique (back of the shoe push rather than bottom of shoe) Response to treatment and progress toward goals Manual therapy: soft tissue mobilization to right gluteals, right lumbar, right ITB, right lateral HS Trigger Point Dry  Needling Subsequent Treatment: Instructions provided previously at initial dry needling treatment.  Patient Verbal Consent Given: Yes Education Handout Provided: Previously Provided Muscles Treated: prone right gluteals, right lumbar multifidi, right ITB, right lateral HS Electrical Stimulation Performed: yes; 80 pps, 1.5 ma 20 min to muscles above Treatment Response/Outcome: Utilized skilled palpation to identify bony landmarks and trigger points.  Able to illicit twitch response and muscle elongation.    PATIENT EDUCATION:  Education details: Educated patient on anatomy and physiology of current symptoms, prognosis, plan of care as well as initial self care strategies to promote recovery Person educated: Patient Education method: Explanation Education comprehension: verbalized understanding  HOME EXERCISE PROGRAM: Access Code:  [email or web address] URL: [email or web address] Date: 12/07/2023 Prepared by: Lavinia Sharps  Exercises - Clamshell with Resistance  - 1 x daily - 7 x weekly - 1 sets - 5 hold - Supine Bridge with Resistance Band  - 1 x daily - 7 x weekly - 1 sets - 7-10 reps - Supine March  - 1 x daily - 7 x weekly - 1 sets - 7-10 reps - Sit to Stand  - 1 x daily - 7 x weekly - 2 sets - 5 reps - Hooklying Single Knee to Chest Stretch  - 1 x daily - 7 x weekly - 1 sets - 10 reps - 5 hold - Supine Piriformis Stretch with Foot on Ground  - 1 x daily - 7 x weekly - 1 sets - 2 reps - 30 hold - Supine Sciatic Nerve Glide  - 1 x daily - 7 x weekly - 2 sets - 10 reps - Supine Lower Trunk Rotation  - 1 x daily - 7 x weekly - 1 sets - 10 reps - Modified Thomas Stretch  - 1 x daily - 7 x weekly - 1 sets - 2 reps - 30 hold - Supine Posterior Pelvic Tilt  - 1 x daily - 7 x weekly - 1 sets - 10 reps - wall isometrics  - 1 x daily - 7 x weekly - 2 sets - 5 reps - 5 hold - Standing Isometric Hip Abduction with Ball on Wall  - 1 x daily - 7 x weekly - 2 sets - 5 reps - 5 hold - Hooklying  Isometric Hip Flexion with Opposite Arm  - 1 x daily - 7 x weekly - 2 sets - 5 reps - 5 hold - Supine Pelvic Tilt with Straight Leg Raise  - 1 x daily - 3 x weekly - 2 sets - 10 reps - Hooklying Sequential Leg March and Lower  - 1 x daily - 3 x weekly - 2 sets - 10 reps - Standing Isometric Hip Flexion with Ball at [employer] (Mirrored)  - 1 x daily - 7 x weekly - 2 sets - 10 reps - 5-10 sec hold - Prone Hip Extension with Pillow Under Abdomen  - 1 x daily - 3 x weekly - 2 sets - 10 reps - Prone Gluteal Sets  - 1 x daily - 7 x weekly - 10 reps - 3 hold - Standing Single Leg Stance with Counter Support  - 1 x daily - 7 x weekly - 1 sets - 10 reps - 5 hold - Standing 4-Way Leg Reach with Counter Support  - 1 x daily - 7 x weekly - 1 sets - 5 reps - Lateral Step Up with Counter Support  - 1 x daily - 7 x weekly - 1 sets - 10 reps - Forward T with Counter Support  - 1 x daily - 7 x weekly - 1 sets - 10 reps - Single Leg Bridge  - 1 x daily - 7 x weekly - 2 sets - 5 reps - Standing Marching  - 1 x daily - 7 x weekly - 1 sets - 15 reps ASSESSMENT:  CLINICAL IMPRESSION: Terry Edwards has improved functionally with ability to walk longer distances, sit for longer periods of time and cross her legs. She has responded well to a gradual progression of exercise and DN combined with ES.  The patient would benefit from a continuation of skilled PT for a further progression of strengthening and functional mobility.  Will continue to update and promote independence in a HEP needed for a return to the highest functional level possible with ADLs.  Will decrease treatment frequency to every 2 weeks to promote self efficacy.     OBJECTIVE IMPAIRMENTS: decreased activity tolerance, difficulty walking, decreased strength, increased fascial restrictions, impaired perceived functional ability, and pain.   ACTIVITY LIMITATIONS: sitting, sleeping, and locomotion level  PARTICIPATION LIMITATIONS: meal prep, cleaning, laundry,  driving, and community activity  PERSONAL FACTORS: 1-2 comorbidities: osteoporosis, bil neuropathy in feet  are also affecting patient's functional outcome.   REHAB POTENTIAL: Good  CLINICAL DECISION MAKING: Stable/uncomplicated  EVALUATION COMPLEXITY: Low   GOALS: Goals reviewed with patient? Yes  SHORT TERM GOALS: Target date: 12/05/2023    The patient will demonstrate knowledge of basic self care strategies and exercises to promote healing  Baseline: Goal status: met 3/11  2.  The patient will report a 40% improvement in pain levels with functional activities which are currently difficult including walking, sitting Baseline:  Goal status: met 3/11  3.  The patient will have improved hip strength to at least 4/5 needed for standing, walking longer distances and descending stairs at home and in the community  Baseline:  Goal status: met 3/4    LONG TERM GOALS: Target date: 03/26/2024     The patient will be independent in a safe self progression of a home exercise program to promote further recovery of function   Baseline:  Goal status: ongoing 2.  The patient will report a 75% improvement in pain levels with functional activities which are currently difficult including walking, sitting, as the day goes on Baseline:  Goal status: met 3/11  3.  The patient will have improved hip strength to at least 4+/5 needed for standing, walking longer distances and descending stairs at home and in the community  Baseline:  Goal status: ongoing  4.  Able to walk 2.5 miles with pain level 2/10 Baseline:  Goal status: met 3/11 can do 3 miles a little achy after  5.  Modified Oswestry score improved to 7% Baseline:  Goal status: ongoing  6. PSFS score with working on the computer improved to 8 New  7. PSFS score with walking 2-3 miles improved to 8 NEW  8. PSFS score with standing to cook improved to 8 NEW   PLAN:  PT FREQUENCY: every 2 weeks  PT DURATION: 12  weeks  PLANNED INTERVENTIONS: 97164- PT Re-evaluation, 97110-Therapeutic exercises, 97530- Therapeutic activity, 97112- Neuromuscular re-education, 97535- Self Care, [id number]- Manual therapy, [id number]- Aquatic Therapy, 97014- Electrical stimulation (unattended), [id number]- Electrical stimulation (manual), [id number]- Vasopneumatic device, [id number]- Ultrasound, [id number]- Traction (mechanical), [id number]- Ionotophoresis 4mg /ml Dexamethasone, Patient/Family education, Balance training, Stair training, Taping, Dry Needling, Joint mobilization, Spinal mobilization, Cryotherapy, and Moist heat.  PLAN FOR NEXT SESSION: reduce treatment to biweekly;  DN with ES;   Rt LE  4";  weighted to single leg [place] dead lift to stool height;   emphasis on Rt gluteal strength (has + Trendelenburg and hip abd/ext weakness);   has 3# and 5# at home; looking for a small step  Lavinia Sharps, PT 01/02/24 10:31 AM Phone: [phone] Fax: [phone]

## 2024-01-17 ENCOUNTER — Encounter: Payer: Self-pay | Admitting: Sports Medicine

## 2024-01-23 ENCOUNTER — Ambulatory Visit: Admitting: Obstetrics and Gynecology

## 2024-01-23 ENCOUNTER — Encounter: Payer: Self-pay | Admitting: Obstetrics and Gynecology

## 2024-01-23 VITALS — BP 132/78 | HR 82

## 2024-01-23 DIAGNOSIS — R350 Frequency of micturition: Secondary | ICD-10-CM

## 2024-01-23 DIAGNOSIS — N3281 Overactive bladder: Secondary | ICD-10-CM

## 2024-01-23 DIAGNOSIS — R3915 Urgency of urination: Secondary | ICD-10-CM

## 2024-01-23 NOTE — Progress Notes (Signed)
 [facility]  PTNS VISIT  CC:  Overactive bladder  88 y.o. with refractory overactive bladder who presents for percutaneous tibial nerve stimulation. The patient presents for PTNS Maintenance  session # 4.   Procedure: The patient was placed in the sitting position and the left lower extremity was prepped in the usual fashion. The PTNS needle was then inserted at a 60 degree angle, 5 cm cephalad and 2 cm posterior to the medial malleolus. The PTNS unit was then programmed and an optimal response was noted at 13 milliamps. The PTNS stimulation was then performed at this setting for 30 minutes without incident and the patient tolerated the procedure well. The needle was removed and hemostasis was noted.   The pt will return in 8 weeks for PTNS session # 5. All questions were answered.

## 2024-01-24 ENCOUNTER — Ambulatory Visit: Admitting: Physical Therapy

## 2024-01-24 DIAGNOSIS — M6281 Muscle weakness (generalized): Secondary | ICD-10-CM | POA: Diagnosis not present

## 2024-01-24 DIAGNOSIS — M545 Low back pain, unspecified: Secondary | ICD-10-CM

## 2024-01-24 NOTE — Therapy (Signed)
 OUTPATIENT PHYSICAL THERAPY THORACOLUMBAR TREATMENT   Patient Name: Terry Edwards MRN: [id number] DOB:January 24, 1936, 88 y.o., female Today's Date: 01/24/2024   END OF SESSION:  PT End of Session - 01/24/24 0931     Visit Number 14    Date for PT Re-Evaluation 03/26/24    Authorization Type [employer]    Progress Note Due on Visit 20    PT Start Time 0931    PT Stop Time 1014    PT Time Calculation (min) 43 min    Activity Tolerance Patient tolerated treatment well                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   Cataract    removed bilateral eyes   Chronic hepatitis B without hepatic coma (HCC) 1970s   chronic elev AFP   Cyst of pineal gland    incidental on MRI   Heart murmur    Hemorrhoids    History of bone density study 2022   History of chicken pox    History of depression 2006   situational, on Lexapro for 1 year   History of mammogram 2024   History of MRI 2024   Right shoulder   Hyperlipidemia    Osteopenia    hx fx 5th MT R foot 03/2013   Osteoporosis    Plantar fasciitis of left foot    Per [facility] new patient packet   Right rotator cuff tendonitis 2013   resolved with PT (injections not helpful)   Urine incontinence    Vitamin D  deficiency    Past Surgical History:  Procedure Laterality Date   APPENDECTOMY  1957   BLEPHAROPLASTY     Per [facility] new patient packet   COLONOSCOPY     EYE SURGERY Bilateral 03/09/15, 03/23/15   cataract removal   TONSILLECTOMY  1940's   Patient Active Problem List   Diagnosis Date Noted   Hardening of the aorta (main artery of the heart) (HCC) 06/14/2023   Thoracic aortic aneurysm (HCC) 07/27/2022   Elevated blood pressure reading without diagnosis of hypertension 11/13/2021   Right hand pain 11/13/2021   Impingement syndrome of shoulder region 10/25/2021   AF (amaurosis fugax) 07/09/2021    Drug-induced skin rash 04/23/2021   Cyst of joint of hand, right 01/26/2021   Thinning hair 10/12/2020   Murmur, cardiac 10/12/2020   Traumatic incomplete tear of right rotator cuff 10/12/2020   Urge incontinence 10/12/2020   Routine general medical examination at a health care facility 01/09/2015   Chronic toe pain, right foot 09/03/2014   Cyst of pineal gland    Vitamin D  deficiency    Osteoporosis    Hx of type B viral hepatitis    Hyperlipidemia     PCP: Moody Appl MD  REFERRING PROVIDER: Moody Appl MD  REFERRING DIAG: M54.50 acute right sided low back pain without sciatica  Rationale for Evaluation and Treatment: Rehabilitation  THERAPY DIAG:  Back pain; weakness ONSET DATE: 3 months  SUBJECTIVE:  SUBJECTIVE STATEMENT: I'm better.  When I walk inclines that kicks it up.  Localized to one area now lower right buttock and a little both sides of my low back.    Intake history from 11/06/22:  Symptoms began 2-3 months ago for no apparent reason.  Pain in bil low back, right SI region, right posterior thigh (upper to mid way) Does upper body weights at home, doesn't do lower body per se Had previous pelvic floor PT at this facility Had acupuncture (helpful)   PERTINENT HISTORY:  Osteoporosis; thoracic aortic aneurysm; bil foot neuropathy (numbness); right rotator cuff tear; wrist fracture  PAIN:   Are you having pain? Yes NPRS scale: 1-2/10 right lateral thigh, buttock Pain location: right buttock, distal lateral HS, ITB, low back Pain orientation: Right  PAIN TYPE: aching Pain description: intermittent  Aggravating factors: as the day goes on, sitting on a hard chair, sitting with legs crossed, sometimes walking; sidelying with legs bent Relieving factors:  Tylenol, AM; PRECAUTIONS: None  WEIGHT BEARING RESTRICTIONS: No  FALLS:  Has patient fallen in last 6 months? No LIVING ENVIRONMENT: Lives with: lives alone Lives in: House/apartment OCCUPATION: retired  PLOF: Independent  PATIENT GOALS: get rid of this discomfort, be able to walk without pain;  (sitting) do art work, sit for reading , cooking  NEXT MD VISIT: as needed   OBJECTIVE:  Note: Objective measures were completed at Evaluation unless otherwise noted.  DIAGNOSTIC FINDINGS:  2022: Results:   Lumbar spine L1-L4 Femoral neck (FN) 33% distal radius  T-score -2.6 RFN: -1.6 LFN: -2.0 n/a  Change in BMD from previous DXA test (%) Down 0.7% Down 2.0% n/a  (*) statistically significant  PATIENT SURVEYS:  Modified Oswestry 10%  01/02/24: 18%  COGNITION: Overall cognitive status: Within functional limits for tasks assessed     MUSCLE LENGTH:  neural tension right with slump test and SLR;  decreased right hip flexor length  POSTURE: decreased lumbar lordosis  PALPATION: Tender points in gluteals and piriformis on right; minimal tenderness ischial tuberosity and proximal HS  LUMBAR ROM:   AROM eval 3/11 4/1  Flexion WFLs WFLS Fingers 6 inches from toes  Extension 15  20 20   Right lateral flexion 20 25 30   Left lateral flexion 20 25 25   Right rotation     Left rotation      (Blank rows = not tested)  TRUNK STRENGTH:  Decreased activation of transverse abdominus muscles; abdominals 4-/5; decreased activation of lumbar multifidi; trunk extensors 4-/5   LOWER EXTREMITY ROM:   WFLs   LOWER EXTREMITY MMT:  decreased right hip abduction 4-/5; left SLS 8 sec with ease, right SLS 5 sec with pelvic drop and inc difficulty stabilizing 3/4: right hip abduction 4/5 3/11: right hip abduction 4/5 4/1: grossly 4/5: SLS 45 sec on right with pelvic drop and knee internal rotation in last 10 sec; left 29 sec with internal rotation and more difficulty stabilizing  LUMBAR SPECIAL  TESTS:  + neural tension on right with slump test and SLR  FUNCTIONAL TESTS:  5x STS no Ues 10.3 sec 4/1:  8.18 sec  The Patient-Specific Functional Scale  Initial:  I am going to ask you to identify up to 3 important activities that you are unable to do or are having difficulty with as a result of this problem.  Today are there any activities that you are unable to do or having difficulty with because of this?  (Patient shown scale and patient rated each  activity)  Follow up: When you first came in you had difficulty performing these activities.  Today do you still have difficulty?  Patient-Specific activity scoring scheme (Point to one number):  0 1 2 3 4 5 6 7 8 9  10 Unable                                                                                                          Able to perform To perform                                                                                                    activity at the same Activity         Level as before                                                                                                                       Injury or problem  Activity       Working/sitting at the computer                                                                          Initial:        7                 2.            Standing to cook                                                                      Initial:        7  3.                  Walking 2-3 miles                                                                Initial:         7                   TREATMENT DATE:  01/24/24 6 inch lateral step ups with 5 sec hold 10x right/left Review of current HEP and progression Manual therapy: soft tissue mobilization to right gluteals, bil lumbar paraspinals, right lateral HS Trigger Point Dry Needling Subsequent Treatment: Instructions provided previously at initial dry needling treatment.  Patient Verbal Consent Given:  Yes Education Handout Provided: Previously Provided Muscles Treated: prone right gluteals, right and left lower lumbar multifidi, right ITB, right lateral HS Electrical Stimulation Performed: yes; 80 pps, 1.5 ma 8 min to muscles above Treatment Response/Outcome: Utilized skilled palpation to identify bony landmarks and trigger points.  Able to illicit twitch response and muscle elongation.   01/02/24 Modified Oswestry PSFS SLS right/left Lumbar ROM 4 inch lateral step ups with 5 sec hold 15x right/left Discussed progression to single leg RDLs to a heavier weight Discussion on plan of care  Manual therapy: soft tissue mobilization to right gluteals, right lumbar, right ITB, right lateral HS Trigger Point Dry Needling Subsequent Treatment: Instructions provided previously at initial dry needling treatment.  Patient Verbal Consent Given: Yes Education Handout Provided: Previously Provided Muscles Treated: prone right gluteals, right lumbar multifidi, right ITB, right lateral HS Electrical Stimulation Performed: yes; 80 pps, 1.5 ma 8 min to muscles above Treatment Response/Outcome: Utilized skilled palpation to identify bony landmarks and trigger points.  Able to illicit twitch response and muscle elongation.   12/29/23 Discussion on response from recent trip to [place]  Discussion on plan of care  Manual therapy: soft tissue mobilization to right gluteals, right lumbar, right ITB, right lateral HS Trigger Point Dry Needling Subsequent Treatment: Instructions provided previously at initial dry needling treatment.  Patient Verbal Consent Given: Yes Education Handout Provided: Previously Provided Muscles Treated: prone right gluteals, right lumbar multifidi, right ITB, right lateral HS Electrical Stimulation Performed: yes; 80 pps, 1.5 ma 20 min to muscles above Treatment Response/Outcome: Utilized skilled palpation to identify bony landmarks and trigger points.  Able to illicit twitch  response and muscle elongation.   12/14/23 Comprehensive review of HEP and determination of which ex's to focus on, which to select based on symptom irritability and which are more advanced Hip extension isometric against wall 5sec hold 5x right/left 4 inch lateral step up/down 15x right/left single hand support  4 inch forward step up 5 sec hold at the top 20x right/left No pain following ex's Therapeutic activity: walking, standing, steps, curbs Discussion on limiting sitting while traveling to [place] : airport ex's, lumbar roll  PATIENT EDUCATION:  Education details: Educated patient on anatomy and physiology of current symptoms, prognosis, plan of care as well as initial self care strategies to promote recovery Person educated: Patient Education method: Explanation Education comprehension: verbalized understanding  HOME EXERCISE PROGRAM: Access Code: [email or web address] URL: [email or web address] Date: 12/07/2023 Prepared by: Darien Eden  Exercises - Clamshell with Resistance  - 1 x daily -  7 x weekly - 1 sets - 5 hold - Supine Bridge with Resistance Band  - 1 x daily - 7 x weekly - 1 sets - 7-10 reps - Supine March  - 1 x daily - 7 x weekly - 1 sets - 7-10 reps - Sit to Stand  - 1 x daily - 7 x weekly - 2 sets - 5 reps - Hooklying Single Knee to Chest Stretch  - 1 x daily - 7 x weekly - 1 sets - 10 reps - 5 hold - Supine Piriformis Stretch with Foot on Ground  - 1 x daily - 7 x weekly - 1 sets - 2 reps - 30 hold - Supine Sciatic Nerve Glide  - 1 x daily - 7 x weekly - 2 sets - 10 reps - Supine Lower Trunk Rotation  - 1 x daily - 7 x weekly - 1 sets - 10 reps - Modified Thomas Stretch  - 1 x daily - 7 x weekly - 1 sets - 2 reps - 30 hold - Supine Posterior Pelvic Tilt  - 1 x daily - 7 x weekly - 1 sets - 10 reps - wall isometrics  - 1 x daily - 7 x weekly - 2 sets - 5 reps - 5 hold - Standing Isometric Hip Abduction with Ball on Wall  - 1 x daily - 7 x weekly - 2 sets - 5  reps - 5 hold - Hooklying Isometric Hip Flexion with Opposite Arm  - 1 x daily - 7 x weekly - 2 sets - 5 reps - 5 hold - Supine Pelvic Tilt with Straight Leg Raise  - 1 x daily - 3 x weekly - 2 sets - 10 reps - Hooklying Sequential Leg March and Lower  - 1 x daily - 3 x weekly - 2 sets - 10 reps - Standing Isometric Hip Flexion with Ball at [employer] (Mirrored)  - 1 x daily - 7 x weekly - 2 sets - 10 reps - 5-10 sec hold - Prone Hip Extension with Pillow Under Abdomen  - 1 x daily - 3 x weekly - 2 sets - 10 reps - Prone Gluteal Sets  - 1 x daily - 7 x weekly - 10 reps - 3 hold - Standing Single Leg Stance with Counter Support  - 1 x daily - 7 x weekly - 1 sets - 10 reps - 5 hold - Standing 4-Way Leg Reach with Counter Support  - 1 x daily - 7 x weekly - 1 sets - 5 reps - Lateral Step Up with Counter Support  - 1 x daily - 7 x weekly - 1 sets - 10 reps - Forward T with Counter Support  - 1 x daily - 7 x weekly - 1 sets - 10 reps - Single Leg Bridge  - 1 x daily - 7 x weekly - 2 sets - 5 reps - Standing Marching  - 1 x daily - 7 x weekly - 1 sets - 15 reps ASSESSMENT:  CLINICAL IMPRESSION: Increased step height to 6 inches and she plans use her curb at home and light touch support of her mailbox.  Discussed other progression of her HEP to build strength in glutes needed for walking uphill.  She responds well to DN combined with ES.  Decreasing tender points and symptoms more centralized to lumbar and buttock region.  Therapist monitoring response throughout session and adjusting treatment parameters as needed.  OBJECTIVE IMPAIRMENTS: decreased activity tolerance, difficulty walking, decreased strength, increased fascial restrictions, impaired perceived functional ability, and pain.   ACTIVITY LIMITATIONS: sitting, sleeping, and locomotion level  PARTICIPATION LIMITATIONS: meal prep, cleaning, laundry, driving, and community activity  PERSONAL FACTORS: 1-2 comorbidities: osteoporosis, bil  neuropathy in feet  are also affecting patient's functional outcome.   REHAB POTENTIAL: Good  CLINICAL DECISION MAKING: Stable/uncomplicated  EVALUATION COMPLEXITY: Low   GOALS: Goals reviewed with patient? Yes  SHORT TERM GOALS: Target date: 12/05/2023    The patient will demonstrate knowledge of basic self care strategies and exercises to promote healing  Baseline: Goal status: met 3/11  2.  The patient will report a 40% improvement in pain levels with functional activities which are currently difficult including walking, sitting Baseline:  Goal status: met 3/11  3.  The patient will have improved hip strength to at least 4/5 needed for standing, walking longer distances and descending stairs at home and in the community  Baseline:  Goal status: met 3/4    LONG TERM GOALS: Target date: 03/26/2024     The patient will be independent in a safe self progression of a home exercise program to promote further recovery of function   Baseline:  Goal status: ongoing 2.  The patient will report a 75% improvement in pain levels with functional activities which are currently difficult including walking, sitting, as the day goes on Baseline:  Goal status: met 3/11  3.  The patient will have improved hip strength to at least 4+/5 needed for standing, walking longer distances and descending stairs at home and in the community  Baseline:  Goal status: ongoing  4.  Able to walk 2.5 miles with pain level 2/10 Baseline:  Goal status: met 3/11 can do 3 miles a little achy after  5.  Modified Oswestry score improved to 7% Baseline:  Goal status: ongoing  6. PSFS score with working on the computer improved to 8 New  7. PSFS score with walking 2-3 miles improved to 8 NEW  8. PSFS score with standing to cook improved to 8 NEW   PLAN:  PT FREQUENCY: every 2 weeks  PT DURATION: 12 weeks  PLANNED INTERVENTIONS: 97164- PT Re-evaluation, 97110-Therapeutic exercises, 97530-  Therapeutic activity, 97112- Neuromuscular re-education, 97535- Self Care, 29528- Manual therapy, [id number]- Aquatic Therapy, 97014- Electrical stimulation (unattended), [id number]- Electrical stimulation (manual), [id number]- Vasopneumatic device, [id number]- Ultrasound, [id number]- Traction (mechanical), [id number]- Ionotophoresis 4mg /ml Dexamethasone, Patient/Family education, Balance training, Stair training, Taping, Dry Needling, Joint mobilization, Spinal mobilization, Cryotherapy, and Moist heat.  PLAN FOR NEXT SESSION: reduce treatment to biweekly;  DN with ES;   Rt LE  4";  weighted to single leg [place] dead lift to stool height;   emphasis on Rt gluteal strength (has + Trendelenburg and hip abd/ext weakness);   has 3# and 5# at home; looking for a small step  Darien Eden, PT 01/24/24 6:20 PM Phone: [phone] Fax: [phone]

## 2024-01-25 ENCOUNTER — Ambulatory Visit: Payer: Medicare HMO | Admitting: Obstetrics and Gynecology

## 2024-02-07 ENCOUNTER — Ambulatory Visit: Attending: Sports Medicine | Admitting: Physical Therapy

## 2024-02-07 DIAGNOSIS — M6281 Muscle weakness (generalized): Secondary | ICD-10-CM | POA: Insufficient documentation

## 2024-02-07 DIAGNOSIS — M545 Low back pain, unspecified: Secondary | ICD-10-CM | POA: Diagnosis not present

## 2024-02-07 NOTE — Therapy (Signed)
 OUTPATIENT PHYSICAL THERAPY THORACOLUMBAR TREATMENT   Patient Name: Terry Edwards [place] MRN: [id number] DOB:18-Dec-1935, 88 y.o., female Today's Date: 02/07/2024   END OF SESSION:  PT End of Session - 02/07/24 0927     Visit Number 15    Date for PT Re-Evaluation 03/26/24    Authorization Type [employer]    Progress Note Due on Visit 20    PT Start Time 0930    PT Stop Time 1014    PT Time Calculation (min) 44 min    Activity Tolerance Patient tolerated treatment well                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   Cataract    removed bilateral eyes   Chronic hepatitis B without hepatic coma (HCC) 1970s   chronic elev AFP   Cyst of pineal gland    incidental on MRI   Heart murmur    Hemorrhoids    History of bone density study 2022   History of chicken pox    History of depression 2006   situational, on Lexapro for 1 year   History of mammogram 2024   History of MRI 2024   Right shoulder   Hyperlipidemia    Osteopenia    hx fx 5th MT R foot 03/2013   Osteoporosis    Plantar fasciitis of left foot    Per [facility] new patient packet   Right rotator cuff tendonitis 2013   resolved with PT (injections not helpful)   Urine incontinence    Vitamin D  deficiency    Past Surgical History:  Procedure Laterality Date   APPENDECTOMY  1957   BLEPHAROPLASTY     Per [facility] new patient packet   COLONOSCOPY     EYE SURGERY Bilateral 03/09/15, 03/23/15   cataract removal   TONSILLECTOMY  1940's   Patient Active Problem List   Diagnosis Date Noted   Hardening of the aorta (main artery of the heart) (HCC) 06/14/2023   Thoracic aortic aneurysm (HCC) 07/27/2022   Elevated blood pressure reading without diagnosis of hypertension 11/13/2021   Right hand pain 11/13/2021   Impingement syndrome of shoulder region 10/25/2021   AF (amaurosis fugax) 07/09/2021    Drug-induced skin rash 04/23/2021   Cyst of joint of hand, right 01/26/2021   Thinning hair 10/12/2020   Murmur, cardiac 10/12/2020   Traumatic incomplete tear of right rotator cuff 10/12/2020   Urge incontinence 10/12/2020   Routine general medical examination at a health care facility 01/09/2015   Chronic toe pain, right foot 09/03/2014   Cyst of pineal gland    Vitamin D  deficiency    Osteoporosis    Hx of type B viral hepatitis    Hyperlipidemia     PCP: Moody Appl MD  REFERRING PROVIDER: Moody Appl MD  REFERRING DIAG: M54.50 acute right sided low back pain without sciatica  Rationale for Evaluation and Treatment: Rehabilitation  THERAPY DIAG:  Back pain; weakness ONSET DATE: 3 months  SUBJECTIVE:  SUBJECTIVE STATEMENT: I'm pretty good except that bony area buttock, ischial tuberosity.  Driving 9 hours to [place]  on Saturday.    Intake history from 11/06/22:  Symptoms began 2-3 months ago for no apparent reason.  Pain in bil low back, right SI region, right posterior thigh (upper to mid way) Does upper body weights at home, doesn't do lower body per se Had previous pelvic floor PT at this facility Had acupuncture (helpful)   PERTINENT HISTORY:  Osteoporosis; thoracic aortic aneurysm; bil foot neuropathy (numbness); right rotator cuff tear; wrist fracture  PAIN:   Are you having pain? Yes NPRS scale: 1-2/10 right lateral thigh, buttock Pain location: right buttock, distal lateral HS, ITB, low back Pain orientation: Right  PAIN TYPE: aching Pain description: intermittent  Aggravating factors: as the day goes on, sitting on a hard chair, sitting with legs crossed, sometimes walking; sidelying with legs bent Relieving factors: Tylenol, AM; PRECAUTIONS:  None  WEIGHT BEARING RESTRICTIONS: No  FALLS:  Has patient fallen in last 6 months? No LIVING ENVIRONMENT: Lives with: lives alone Lives in: House/apartment OCCUPATION: retired  PLOF: Independent  PATIENT GOALS: get rid of this discomfort, be able to walk without pain;  (sitting) do art work, sit for reading , cooking  NEXT MD VISIT: as needed   OBJECTIVE:  Note: Objective measures were completed at Evaluation unless otherwise noted.  DIAGNOSTIC FINDINGS:  2022: Results:   Lumbar spine L1-L4 Femoral neck (FN) 33% distal radius  T-score -2.6 RFN: -1.6 LFN: -2.0 n/a  Change in BMD from previous DXA test (%) Down 0.7% Down 2.0% n/a  (*) statistically significant  PATIENT SURVEYS:  Modified Oswestry 10%  01/02/24: 18%  COGNITION: Overall cognitive status: Within functional limits for tasks assessed     MUSCLE LENGTH:  neural tension right with slump test and SLR;  decreased right hip flexor length  POSTURE: decreased lumbar lordosis  PALPATION: Tender points in gluteals and piriformis on right; minimal tenderness ischial tuberosity and proximal HS  LUMBAR ROM:   AROM eval 3/11 4/1  Flexion WFLs WFLS Fingers 6 inches from toes  Extension 15  20 20   Right lateral flexion 20 25 30   Left lateral flexion 20 25 25   Right rotation     Left rotation      (Blank rows = not tested)  TRUNK STRENGTH:  Decreased activation of transverse abdominus muscles; abdominals 4-/5; decreased activation of lumbar multifidi; trunk extensors 4-/5   LOWER EXTREMITY ROM:   WFLs   LOWER EXTREMITY MMT:  decreased right hip abduction 4-/5; left SLS 8 sec with ease, right SLS 5 sec with pelvic drop and inc difficulty stabilizing 3/4: right hip abduction 4/5 3/11: right hip abduction 4/5 4/1: grossly 4/5: SLS 45 sec on right with pelvic drop and knee internal rotation in last 10 sec; left 29 sec with internal rotation and more difficulty stabilizing  LUMBAR SPECIAL TESTS:  + neural tension  on right with slump test and SLR  FUNCTIONAL TESTS:  5x STS no Ues 10.3 sec 4/1:  8.18 sec  The Patient-Specific Functional Scale  Initial:  I am going to ask you to identify up to 3 important activities that you are unable to do or are having difficulty with as a result of this problem.  Today are there any activities that you are unable to do or having difficulty with because of this?  (Patient shown scale and patient rated each activity)  Follow up: When you first came in you  had difficulty performing these activities.  Today do you still have difficulty?  Patient-Specific activity scoring scheme (Point to one number):  0 1 2 3 4 5 6 7 8 9  10 Unable                                                                                                          Able to perform To perform                                                                                                    activity at the same Activity         Level as before                                                                                                                       Injury or problem  Activity       Working/sitting at the computer                                                                          Initial:        7                 2.            Standing to cook                                                                      Initial:        7                3.  Walking 2-3 miles                                                                Initial:         7                   TREATMENT DATE:  02/07/24 Discussion of clam ex with band (initially irritating) recommend doing it isometrically if wish to restart this one Review  of 6 inch lateral step ups with opposite hip flexion 5 sec hold 10x right/left Review of current HEP and response Manual therapy: soft tissue mobilization to right gluteals, bil lumbar paraspinals, right lateral HS Trigger Point Dry Needling Subsequent  Treatment: Instructions provided previously at initial dry needling treatment.  Patient Verbal Consent Given: Yes Education Handout Provided: Previously Provided Muscles Treated: prone right gluteals, right and left lower lumbar multifidi, right ITB, right lateral HS Electrical Stimulation Performed: yes; 80 pps, 1.5 ma 8 min to muscles above Treatment Response/Outcome: Utilized skilled palpation to identify bony landmarks and trigger points.  Able to illicit twitch response and muscle elongation.   01/24/24 6 inch lateral step ups with 5 sec hold 10x right/left Review of current HEP and progression Manual therapy: soft tissue mobilization to right gluteals, bil lumbar paraspinals, right lateral HS Trigger Point Dry Needling Subsequent Treatment: Instructions provided previously at initial dry needling treatment.  Patient Verbal Consent Given: Yes Education Handout Provided: Previously Provided Muscles Treated: prone right gluteals, right and left lower lumbar multifidi, right ITB, right lateral HS Electrical Stimulation Performed: yes; 80 pps, 1.5 ma 8 min to muscles above Treatment Response/Outcome: Utilized skilled palpation to identify bony landmarks and trigger points.  Able to illicit twitch response and muscle elongation.   01/02/24 Modified Oswestry PSFS SLS right/left Lumbar ROM 4 inch lateral step ups with 5 sec hold 15x right/left Discussed progression to single leg RDLs to a heavier weight Discussion on plan of care  Manual therapy: soft tissue mobilization to right gluteals, right lumbar, right ITB, right lateral HS Trigger Point Dry Needling Subsequent Treatment: Instructions provided previously at initial dry needling treatment.  Patient Verbal Consent Given: Yes Education Handout Provided: Previously Provided Muscles Treated: prone right gluteals, right lumbar multifidi, right ITB, right lateral HS Electrical Stimulation Performed: yes; 80 pps, 1.5 ma 8 min to muscles  above Treatment Response/Outcome: Utilized skilled palpation to identify bony landmarks and trigger points.  Able to illicit twitch response and muscle elongation.   12/29/23 Discussion on response from recent trip to [place]  Discussion on plan of care  Manual therapy: soft tissue mobilization to right gluteals, right lumbar, right ITB, right lateral HS Trigger Point Dry Needling Subsequent Treatment: Instructions provided previously at initial dry needling treatment.  Patient Verbal Consent Given: Yes Education Handout Provided: Previously Provided Muscles Treated: prone right gluteals, right lumbar multifidi, right ITB, right lateral HS Electrical Stimulation Performed: yes; 80 pps, 1.5 ma 20 min to muscles above Treatment Response/Outcome: Utilized skilled palpation to identify bony landmarks and trigger points.  Able to illicit twitch response and muscle elongation.   PATIENT EDUCATION:  Education details: Educated patient on anatomy and physiology of current symptoms, prognosis, plan of care as well as initial self care strategies to promote recovery Person educated: Patient  Education method: Explanation Education comprehension: verbalized understanding  HOME EXERCISE PROGRAM: Access Code: [email or web address] URL: [email or web address] Date: 12/07/2023 Prepared by: Darien Eden  Exercises - Clamshell with Resistance  - 1 x daily - 7 x weekly - 1 sets - 5 hold - Supine Bridge with Resistance Band  - 1 x daily - 7 x weekly - 1 sets - 7-10 reps - Supine March  - 1 x daily - 7 x weekly - 1 sets - 7-10 reps - Sit to Stand  - 1 x daily - 7 x weekly - 2 sets - 5 reps - Hooklying Single Knee to Chest Stretch  - 1 x daily - 7 x weekly - 1 sets - 10 reps - 5 hold - Supine Piriformis Stretch with Foot on Ground  - 1 x daily - 7 x weekly - 1 sets - 2 reps - 30 hold - Supine Sciatic Nerve Glide  - 1 x daily - 7 x weekly - 2 sets - 10 reps - Supine Lower Trunk Rotation  - 1 x daily - 7 x  weekly - 1 sets - 10 reps - Modified Thomas Stretch  - 1 x daily - 7 x weekly - 1 sets - 2 reps - 30 hold - Supine Posterior Pelvic Tilt  - 1 x daily - 7 x weekly - 1 sets - 10 reps - wall isometrics  - 1 x daily - 7 x weekly - 2 sets - 5 reps - 5 hold - Standing Isometric Hip Abduction with Ball on Wall  - 1 x daily - 7 x weekly - 2 sets - 5 reps - 5 hold - Hooklying Isometric Hip Flexion with Opposite Arm  - 1 x daily - 7 x weekly - 2 sets - 5 reps - 5 hold - Supine Pelvic Tilt with Straight Leg Raise  - 1 x daily - 3 x weekly - 2 sets - 10 reps - Hooklying Sequential Leg March and Lower  - 1 x daily - 3 x weekly - 2 sets - 10 reps - Standing Isometric Hip Flexion with Ball at [employer] (Mirrored)  - 1 x daily - 7 x weekly - 2 sets - 10 reps - 5-10 sec hold - Prone Hip Extension with Pillow Under Abdomen  - 1 x daily - 3 x weekly - 2 sets - 10 reps - Prone Gluteal Sets  - 1 x daily - 7 x weekly - 10 reps - 3 hold - Standing Single Leg Stance with Counter Support  - 1 x daily - 7 x weekly - 1 sets - 10 reps - 5 hold - Standing 4-Way Leg Reach with Counter Support  - 1 x daily - 7 x weekly - 1 sets - 5 reps - Lateral Step Up with Counter Support  - 1 x daily - 7 x weekly - 1 sets - 10 reps - Forward T with Counter Support  - 1 x daily - 7 x weekly - 1 sets - 10 reps - Single Leg Bridge  - 1 x daily - 7 x weekly - 2 sets - 5 reps - Standing Marching  - 1 x daily - 7 x weekly - 1 sets - 15 reps ASSESSMENT:  CLINICAL IMPRESSION: The patient benefits significantly from dry needling with electrical stimulation to compliment the effects of exercise.    Much improved soft tissue mobility and decreased tender point size and number following treatment session.  She is highly compliant with her HEP.  Will follow up after her drive to [place] and check progress toward goals and PSFS ratings to determine if additional PT visits are needed.      OBJECTIVE IMPAIRMENTS: decreased activity tolerance, difficulty  walking, decreased strength, increased fascial restrictions, impaired perceived functional ability, and pain.   ACTIVITY LIMITATIONS: sitting, sleeping, and locomotion level  PARTICIPATION LIMITATIONS: meal prep, cleaning, laundry, driving, and community activity  PERSONAL FACTORS: 1-2 comorbidities: osteoporosis, bil neuropathy in feet  are also affecting patient's functional outcome.   REHAB POTENTIAL: Good  CLINICAL DECISION MAKING: Stable/uncomplicated  EVALUATION COMPLEXITY: Low   GOALS: Goals reviewed with patient? Yes  SHORT TERM GOALS: Target date: 12/05/2023    The patient will demonstrate knowledge of basic self care strategies and exercises to promote healing  Baseline: Goal status: met 3/11  2.  The patient will report a 40% improvement in pain levels with functional activities which are currently difficult including walking, sitting Baseline:  Goal status: met 3/11  3.  The patient will have improved hip strength to at least 4/5 needed for standing, walking longer distances and descending stairs at home and in the community  Baseline:  Goal status: met 3/4    LONG TERM GOALS: Target date: 03/26/2024     The patient will be independent in a safe self progression of a home exercise program to promote further recovery of function   Baseline:  Goal status: ongoing 2.  The patient will report a 75% improvement in pain levels with functional activities which are currently difficult including walking, sitting, as the day goes on Baseline:  Goal status: met 3/11  3.  The patient will have improved hip strength to at least 4+/5 needed for standing, walking longer distances and descending stairs at home and in the community  Baseline:  Goal status: ongoing  4.  Able to walk 2.5 miles with pain level 2/10 Baseline:  Goal status: met 3/11 can do 3 miles a little achy after  5.  Modified Oswestry score improved to 7% Baseline:  Goal status: ongoing  6. PSFS  score with working on the computer improved to 8 New  7. PSFS score with walking 2-3 miles improved to 8 NEW  8. PSFS score with standing to cook improved to 8 NEW   PLAN:  PT FREQUENCY: every 2 weeks  PT DURATION: 12 weeks  PLANNED INTERVENTIONS: 97164- PT Re-evaluation, 97110-Therapeutic exercises, 97530- Therapeutic activity, 97112- Neuromuscular re-education, 97535- Self Care, 16109- Manual therapy, [id number]- Aquatic Therapy, 97014- Electrical stimulation (unattended), [id number]- Electrical stimulation (manual), [id number]- Vasopneumatic device, [id number]- Ultrasound, [id number]- Traction (mechanical), [id number]- Ionotophoresis 4mg /ml Dexamethasone, Patient/Family education, Balance training, Stair training, Taping, Dry Needling, Joint mobilization, Spinal mobilization, Cryotherapy, and Moist heat.  PLAN FOR NEXT SESSION: follow up after trip to [place];  DN with ES;   weighted to single leg [place] dead lift to stool height;   emphasis on Rt gluteal strength (has + Trendelenburg and hip abd/ext weakness);   has 3# and 5# at home; has a 6 inch step at home Darien Eden, PT 02/07/24 10:28 AM Phone: [phone] Fax: [phone]

## 2024-02-28 ENCOUNTER — Ambulatory Visit: Admitting: Physical Therapy

## 2024-02-28 DIAGNOSIS — M6281 Muscle weakness (generalized): Secondary | ICD-10-CM

## 2024-02-28 DIAGNOSIS — M545 Low back pain, unspecified: Secondary | ICD-10-CM | POA: Diagnosis not present

## 2024-02-28 NOTE — Therapy (Signed)
 OUTPATIENT PHYSICAL THERAPY THORACOLUMBAR TREATMENT/DISCHARGE SUMMARY   Patient Name: Terry Edwards [place] MRN: [id number] DOB:Dec 21, 1935, 88 y.o., female Today's Date: 02/28/2024   END OF SESSION:  PT End of Session - 02/28/24 0930     Visit Number 16    Date for PT Re-Evaluation 03/26/24    Authorization Type [employer]    Progress Note Due on Visit 20    PT Start Time 0930    PT Stop Time 1010    PT Time Calculation (min) 40 min    Activity Tolerance Patient tolerated treatment well                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   Cataract    removed bilateral eyes   Chronic hepatitis B without hepatic coma (HCC) 1970s   chronic elev AFP   Cyst of pineal gland    incidental on MRI   Heart murmur    Hemorrhoids    History of bone density study 2022   History of chicken pox    History of depression 2006   situational, on Lexapro for 1 year   History of mammogram 2024   History of MRI 2024   Right shoulder   Hyperlipidemia    Osteopenia    hx fx 5th MT R foot 03/2013   Osteoporosis    Plantar fasciitis of left foot    Per [facility] new patient packet   Right rotator cuff tendonitis 2013   resolved with PT (injections not helpful)   Urine incontinence    Vitamin D  deficiency    Past Surgical History:  Procedure Laterality Date   APPENDECTOMY  1957   BLEPHAROPLASTY     Per [facility] new patient packet   COLONOSCOPY     EYE SURGERY Bilateral 03/09/15, 03/23/15   cataract removal   TONSILLECTOMY  1940's   Patient Active Problem List   Diagnosis Date Noted   Hardening of the aorta (main artery of the heart) (HCC) 06/14/2023   Thoracic aortic aneurysm (HCC) 07/27/2022   Elevated blood pressure reading without diagnosis of hypertension 11/13/2021   Right hand pain 11/13/2021   Impingement syndrome of shoulder region 10/25/2021   AF (amaurosis  fugax) 07/09/2021   Drug-induced skin rash 04/23/2021   Cyst of joint of hand, right 01/26/2021   Thinning hair 10/12/2020   Murmur, cardiac 10/12/2020   Traumatic incomplete tear of right rotator cuff 10/12/2020   Urge incontinence 10/12/2020   Routine general medical examination at a health care facility 01/09/2015   Chronic toe pain, right foot 09/03/2014   Cyst of pineal gland    Vitamin D  deficiency    Osteoporosis    Hx of type B viral hepatitis    Hyperlipidemia     PCP: Moody Appl MD  REFERRING PROVIDER: Moody Appl MD  REFERRING DIAG: M54.50 acute right sided low back pain without sciatica  Rationale for Evaluation and Treatment: Rehabilitation  THERAPY DIAG:  Back pain; weakness ONSET DATE: 3 months  SUBJECTIVE:  SUBJECTIVE STATEMENT: Handled the drive to [place] just fine.  It has been feeling good.   Intake history from 11/06/22:  Symptoms began 2-3 months ago for no apparent reason.  Pain in bil low back, right SI region, right posterior thigh (upper to mid way) Does upper body weights at home, doesn't do lower body per se Had previous pelvic floor PT at this facility Had acupuncture (helpful)   PERTINENT HISTORY:  Osteoporosis; thoracic aortic aneurysm; bil foot neuropathy (numbness); right rotator cuff tear; wrist fracture  PAIN:   Are you having pain? Yes NPRS scale: 1/10 right ischial  tuberosity Pain location: right buttock, distal lateral HS, ITB, low back Pain orientation: Right  PAIN TYPE: aching Pain description: intermittent  Aggravating factors: as the day goes on, sitting on a hard chair, sitting with legs crossed, sometimes walking; sidelying with legs bent Relieving factors: Tylenol, AM; PRECAUTIONS: None  WEIGHT BEARING RESTRICTIONS:  No  FALLS:  Has patient fallen in last 6 months? No LIVING ENVIRONMENT: Lives with: lives alone Lives in: House/apartment OCCUPATION: retired  PLOF: Independent  PATIENT GOALS: get rid of this discomfort, be able to walk without pain;  (sitting) do art work, sit for reading , cooking  NEXT MD VISIT: as needed   OBJECTIVE:  Note: Objective measures were completed at Evaluation unless otherwise noted.  DIAGNOSTIC FINDINGS:  2022: Results:   Lumbar spine L1-L4 Femoral neck (FN) 33% distal radius  T-score -2.6 RFN: -1.6 LFN: -2.0 n/a  Change in BMD from previous DXA test (%) Down 0.7% Down 2.0% n/a  (*) statistically significant  PATIENT SURVEYS:  Modified Oswestry 10%  01/02/24: 18% 5/28: 10%   COGNITION: Overall cognitive status: Within functional limits for tasks assessed     MUSCLE LENGTH:  neural tension right with slump test and SLR;  decreased right hip flexor length  POSTURE: decreased lumbar lordosis  PALPATION: Tender points in gluteals and piriformis on right; minimal tenderness ischial tuberosity and proximal HS  LUMBAR ROM:   AROM eval 3/11 4/1 5/28  Flexion WFLs WFLS Fingers 6 inches from toes WNLs  Extension 15  20 20 25   Right lateral flexion 20 25 30 30   Left lateral flexion 20 25 25 30   Right rotation      Left rotation       (Blank rows = not tested)  TRUNK STRENGTH:  Decreased activation of transverse abdominus muscles; abdominals 4-/5; decreased activation of lumbar multifidi; trunk extensors 4-/5 5/28:  grossly 4+/5  LOWER EXTREMITY ROM:   WFLs   LOWER EXTREMITY MMT:  decreased right hip abduction 4-/5; left SLS 8 sec with ease, right SLS 5 sec with pelvic drop and inc difficulty stabilizing 3/4: right hip abduction 4/5 3/11: right hip abduction 4/5 4/1: grossly 4/5: SLS 45 sec on right with pelvic drop and knee internal rotation in last 10 sec; left 29 sec with internal rotation and more difficulty stabilizing 5/28: right hip 5/5 with no  pelvic drop with SLS   LUMBAR SPECIAL TESTS:  + neural tension on right with slump test and SLR  FUNCTIONAL TESTS:  5x STS no Ues 10.3 sec 4/1:  8.18 sec   The Patient-Specific Functional Scale  Initial:  I am going to ask you to identify up to 3 important activities that you are unable to do or are having difficulty with as a result of this problem.  Today are there any activities that you are unable to do or having difficulty with because of this?  (  Patient shown scale and patient rated each activity)  Follow up: When you first came in you had difficulty performing these activities.  Today do you still have difficulty?  Patient-Specific activity scoring scheme (Point to one number):  0 1 2 3 4 5 6 7 8 9  10 Unable                                                                                                          Able to perform To perform                                                                                                    activity at the same Activity         Level as before                                                                                                                       Injury or problem  Activity       Working/sitting at the computer                                                                          Initial:        7             5/28: 8-10    2.            Standing to cook                                                                      Initial:  7         5/28 : 10       3.                  Walking 2-3 miles                                                                Initial:         7          5/28:           TREATMENT DATE:  02/28/24: Review of HEP and discussion of continuation Discussed of pillow options for sidesleeping: memory foam  ROM and strength assessment Oswestry PSFS Manual therapy: soft tissue mobilization to right gluteals, bil lumbar paraspinals, right lateral HS Trigger Point Dry  Needling Subsequent Treatment: Instructions provided previously at initial dry needling treatment.  Patient Verbal Consent Given: Yes Education Handout Provided: Previously Provided Muscles Treated: prone right gluteals, right and left lower lumbar multifidi, right ITB, right lateral HS Electrical Stimulation Performed: yes; 80 pps, 1.5 ma 8 min to muscles above Treatment Response/Outcome: Utilized skilled palpation to identify bony landmarks and trigger points.  Able to illicit twitch response and muscle elongation.   02/07/24 Discussion of clam ex with band (initially irritating) recommend doing it isometrically if wish to restart this one Review  of 6 inch lateral step ups with opposite hip flexion 5 sec hold 10x right/left Review of current HEP and response Manual therapy: soft tissue mobilization to right gluteals, bil lumbar paraspinals, right lateral HS Trigger Point Dry Needling Subsequent Treatment: Instructions provided previously at initial dry needling treatment.  Patient Verbal Consent Given: Yes Education Handout Provided: Previously Provided Muscles Treated: prone right gluteals, right and left lower lumbar multifidi, right ITB, right lateral HS Electrical Stimulation Performed: yes; 80 pps, 1.5 ma 8 min to muscles above Treatment Response/Outcome: Utilized skilled palpation to identify bony landmarks and trigger points.  Able to illicit twitch response and muscle elongation.   01/24/24 6 inch lateral step ups with 5 sec hold 10x right/left Review of current HEP and progression Manual therapy: soft tissue mobilization to right gluteals, bil lumbar paraspinals, right lateral HS Trigger Point Dry Needling Subsequent Treatment: Instructions provided previously at initial dry needling treatment.  Patient Verbal Consent Given: Yes Education Handout Provided: Previously Provided Muscles Treated: prone right gluteals, right and left lower lumbar multifidi, right ITB, right lateral  HS Electrical Stimulation Performed: yes; 80 pps, 1.5 ma 8 min to muscles above Treatment Response/Outcome: Utilized skilled palpation to identify bony landmarks and trigger points.  Able to illicit twitch response and muscle elongation.   PATIENT EDUCATION:  Education details: Educated patient on anatomy and physiology of current symptoms, prognosis, plan of care as well as initial self care strategies to promote recovery Person educated: Patient Education method: Explanation Education comprehension: verbalized understanding  HOME EXERCISE PROGRAM: Access Code: [email or web address] URL: [email or web address] Date: 12/07/2023 Prepared by: Darien Eden  Exercises - Clamshell with Resistance  - 1 x daily - 7 x weekly - 1 sets - 5 hold - Supine Bridge with Resistance Band  - 1 x daily - 7 x weekly - 1 sets - 7-10 reps - Supine March  - 1 x daily - 7 x weekly -  1 sets - 7-10 reps - Sit to Stand  - 1 x daily - 7 x weekly - 2 sets - 5 reps - Hooklying Single Knee to Chest Stretch  - 1 x daily - 7 x weekly - 1 sets - 10 reps - 5 hold - Supine Piriformis Stretch with Foot on Ground  - 1 x daily - 7 x weekly - 1 sets - 2 reps - 30 hold - Supine Sciatic Nerve Glide  - 1 x daily - 7 x weekly - 2 sets - 10 reps - Supine Lower Trunk Rotation  - 1 x daily - 7 x weekly - 1 sets - 10 reps - Modified Thomas Stretch  - 1 x daily - 7 x weekly - 1 sets - 2 reps - 30 hold - Supine Posterior Pelvic Tilt  - 1 x daily - 7 x weekly - 1 sets - 10 reps - wall isometrics  - 1 x daily - 7 x weekly - 2 sets - 5 reps - 5 hold - Standing Isometric Hip Abduction with Ball on Wall  - 1 x daily - 7 x weekly - 2 sets - 5 reps - 5 hold - Hooklying Isometric Hip Flexion with Opposite Arm  - 1 x daily - 7 x weekly - 2 sets - 5 reps - 5 hold - Supine Pelvic Tilt with Straight Leg Raise  - 1 x daily - 3 x weekly - 2 sets - 10 reps - Hooklying Sequential Leg March and Lower  - 1 x daily - 3 x weekly - 2 sets - 10 reps -  Standing Isometric Hip Flexion with Ball at [employer] (Mirrored)  - 1 x daily - 7 x weekly - 2 sets - 10 reps - 5-10 sec hold - Prone Hip Extension with Pillow Under Abdomen  - 1 x daily - 3 x weekly - 2 sets - 10 reps - Prone Gluteal Sets  - 1 x daily - 7 x weekly - 10 reps - 3 hold - Standing Single Leg Stance with Counter Support  - 1 x daily - 7 x weekly - 1 sets - 10 reps - 5 hold - Standing 4-Way Leg Reach with Counter Support  - 1 x daily - 7 x weekly - 1 sets - 5 reps - Lateral Step Up with Counter Support  - 1 x daily - 7 x weekly - 1 sets - 10 reps - Forward T with Counter Support  - 1 x daily - 7 x weekly - 1 sets - 10 reps - Single Leg Bridge  - 1 x daily - 7 x weekly - 2 sets - 5 reps - Standing Marching  - 1 x daily - 7 x weekly - 1 sets - 15 reps ASSESSMENT:  CLINICAL IMPRESSION: The patient has met the majority of rehab goals, with noted improvements in pain reduction, outcome score, ROM, strength and functional mobility.  A comprehensive HEP has been established and anticipate further improvements over time with regular performance of the program.  Recommend discharge from PT at this time.     OBJECTIVE IMPAIRMENTS: decreased activity tolerance, difficulty walking, decreased strength, increased fascial restrictions, impaired perceived functional ability, and pain.   ACTIVITY LIMITATIONS: sitting, sleeping, and locomotion level  PARTICIPATION LIMITATIONS: meal prep, cleaning, laundry, driving, and community activity  PERSONAL FACTORS: 1-2 comorbidities: osteoporosis, bil neuropathy in feet are also affecting patient's functional outcome.   REHAB POTENTIAL: Good  CLINICAL DECISION MAKING: Stable/uncomplicated  EVALUATION  COMPLEXITY: Low   GOALS: Goals reviewed with patient? Yes  SHORT TERM GOALS: Target date: 12/05/2023    The patient will demonstrate knowledge of basic self care strategies and exercises to promote healing  Baseline: Goal status: met 3/11  2.  The  patient will report a 40% improvement in pain levels with functional activities which are currently difficult including walking, sitting Baseline:  Goal status: met 3/11  3.  The patient will have improved hip strength to at least 4/5 needed for standing, walking longer distances and descending stairs at home and in the community  Baseline:  Goal status: met 3/4    LONG TERM GOALS: Target date: 03/26/2024     The patient will be independent in a safe self progression of a home exercise program to promote further recovery of function   Baseline:  Goal status: met 5/28  2.  The patient will report a 75% improvement in pain levels with functional activities which are currently difficult including walking, sitting, as the day goes on Baseline:  Goal status: met 3/11  3.  The patient will have improved hip strength to at least 4+/5 needed for standing, walking longer distances and descending stairs at home and in the community  Baseline:  Goal status:met 5/28 4.  Able to walk 2.5 miles with pain level 2/10 Baseline:  Goal status: met 3/11  5.  Modified Oswestry score improved to 7% Baseline:  Goal status: ongoing  6. PSFS score with working on the computer improved to 8 Met 5/28  7. PSFS score with walking 2-3 miles improved to 8 Met 5/28  8. PSFS score with standing to cook improved to 8 Met 5/28   PLAN: PHYSICAL THERAPY DISCHARGE SUMMARY  Visits from Start of Care: 16  Current functional level related to goals / functional outcomes: See clinical impressions above   Remaining deficits: As above   Education / Equipment: HEP   Patient agrees to discharge. Patient goals were met. Patient is being discharged due to meeting the stated rehab goals.  Darien Eden, PT 02/28/24 10:23 AM Phone: [phone] Fax: [phone]

## 2024-03-07 ENCOUNTER — Ambulatory Visit: Admitting: Podiatry

## 2024-03-07 ENCOUNTER — Encounter: Payer: Self-pay | Admitting: Podiatry

## 2024-03-07 VITALS — Ht 60.06 in | Wt 126.0 lb

## 2024-03-07 DIAGNOSIS — B351 Tinea unguium: Secondary | ICD-10-CM | POA: Diagnosis not present

## 2024-03-07 DIAGNOSIS — G629 Polyneuropathy, unspecified: Secondary | ICD-10-CM

## 2024-03-07 NOTE — Progress Notes (Signed)
 Subjective:   Patient ID: Terry Edwards [place], female   DOB: 88 y.o.   MRN: [id number]   HPI Patient presents with concerns about her nails of both feet stating she is noting discoloration and thickness of the fourth nails both feet fifth and slight discoloration of the big toe.  Patient states it has been going on for a while and if it not responded to topical.  Patient does not smoke likes to be active   Review of Systems  All other systems reviewed and are negative.       Objective:  Physical Exam Vitals and nursing note reviewed.  Constitutional:      Appearance: She is well-developed.  Pulmonary:     Effort: Pulmonary effort is normal.  Musculoskeletal:        General: Normal range of motion.  Skin:    General: Skin is warm.  Neurological:     Mental Status: She is alert.     Neurovascular status intact muscle strength adequate range of motion adequate with discoloration and thickness fourth nails bilateral fifth nails bilateral with rotation of the toes creating pressure against the nailbeds.  Good digital perfusion well-oriented     Assessment:  Probability for more trauma versus fungus of the nails with positional component pathology noted     Plan:  H&P reviewed at this point I went ahead and I discussed trauma versus fungus do not recommend current treatment but nail removal could be utilized in future depending on structural issues and pain

## 2024-03-18 ENCOUNTER — Ambulatory Visit: Admitting: Obstetrics and Gynecology

## 2024-03-18 ENCOUNTER — Encounter: Payer: Self-pay | Admitting: Obstetrics and Gynecology

## 2024-03-18 VITALS — BP 146/79 | HR 71

## 2024-03-18 DIAGNOSIS — N3281 Overactive bladder: Secondary | ICD-10-CM | POA: Diagnosis not present

## 2024-03-18 DIAGNOSIS — R350 Frequency of micturition: Secondary | ICD-10-CM

## 2024-03-18 DIAGNOSIS — R3915 Urgency of urination: Secondary | ICD-10-CM

## 2024-03-18 NOTE — Progress Notes (Signed)
 [facility]  PTNS VISIT  CC:  Overactive bladder  88 y.o. with refractory overactive bladder who presents for percutaneous tibial nerve stimulation. The patient presents for PTNS Maintenance session # 4.   Procedure: The patient was placed in the sitting position and the right lower extremity was prepped in the usual fashion. The PTNS needle was then inserted at a 60 degree angle, 5 cm cephalad and 2 cm posterior to the medial malleolus. The PTNS unit was then programmed and an optimal response was noted at 5 milliamps. The PTNS stimulation was then performed at this setting for 30 minutes without incident and the patient tolerated the procedure well. The needle was removed and hemostasis was noted.   The pt will return in 8 week for PTNS maintenance session # 5. All questions were answered.

## 2024-03-21 ENCOUNTER — Encounter: Admitting: Physical Therapy

## 2024-03-21 DIAGNOSIS — L821 Other seborrheic keratosis: Secondary | ICD-10-CM | POA: Diagnosis not present

## 2024-03-21 DIAGNOSIS — D1801 Hemangioma of skin and subcutaneous tissue: Secondary | ICD-10-CM | POA: Diagnosis not present

## 2024-03-21 DIAGNOSIS — L814 Other melanin hyperpigmentation: Secondary | ICD-10-CM | POA: Diagnosis not present

## 2024-03-21 DIAGNOSIS — Z85828 Personal history of other malignant neoplasm of skin: Secondary | ICD-10-CM | POA: Diagnosis not present

## 2024-03-21 DIAGNOSIS — L309 Dermatitis, unspecified: Secondary | ICD-10-CM | POA: Diagnosis not present

## 2024-03-21 DIAGNOSIS — D2239 Melanocytic nevi of other parts of face: Secondary | ICD-10-CM | POA: Diagnosis not present

## 2024-03-22 ENCOUNTER — Encounter: Admitting: Physical Therapy

## 2024-04-03 ENCOUNTER — Telehealth: Payer: Self-pay | Admitting: Sports Medicine

## 2024-04-03 MED ORDER — GABAPENTIN 100 MG PO CAPS: 100.0000 mg | ORAL_CAPSULE | Freq: Every day | ORAL | 5 refills | Status: DC

## 2024-04-03 NOTE — Telephone Encounter (Signed)
 She can try gabapentin 100mg  at bedtime for neuropathy before going to neurology, let me know if patient is interested , I can send in a prescription, thanks.

## 2024-04-03 NOTE — Telephone Encounter (Signed)
 Patient Notified agreed to try Gabapentin.  Pended Rx and sent to Dr. Sherlynn for approval.

## 2024-04-03 NOTE — Telephone Encounter (Signed)
 Called patient and she stated that she has a lot of Neuropathy in her feet. Has not ever seen Neurology before has seen a Podiatrist for this symptom but they told her that it was just age.

## 2024-04-03 NOTE — Telephone Encounter (Signed)
 Can you please call the patient and ask what symptoms she is experiencing , thank you

## 2024-04-16 ENCOUNTER — Encounter: Admitting: Physical Therapy

## 2024-04-22 ENCOUNTER — Other Ambulatory Visit

## 2024-04-22 DIAGNOSIS — E785 Hyperlipidemia, unspecified: Secondary | ICD-10-CM | POA: Diagnosis not present

## 2024-04-22 LAB — LIPID PANEL
Cholesterol: 223 mg/dL — ABNORMAL HIGH (ref <200)
HDL: 82 mg/dL (ref >=50)
LDL Cholesterol (Calc): 125 mg/dL — ABNORMAL HIGH
Non-HDL Cholesterol (Calc): 141 mg/dL — ABNORMAL HIGH (ref <130)
Total CHOL/HDL Ratio: 2.7 (calc) (ref <5.0)
Triglycerides: 69 mg/dL (ref <150)

## 2024-04-25 ENCOUNTER — Ambulatory Visit: Payer: Self-pay | Admitting: Sports Medicine

## 2024-04-30 ENCOUNTER — Ambulatory Visit (INDEPENDENT_AMBULATORY_CARE_PROVIDER_SITE_OTHER): Payer: Medicare HMO | Admitting: Sports Medicine

## 2024-04-30 ENCOUNTER — Other Ambulatory Visit: Payer: Self-pay | Admitting: Family Medicine

## 2024-04-30 ENCOUNTER — Ambulatory Visit: Payer: Medicare HMO | Admitting: Sports Medicine

## 2024-04-30 ENCOUNTER — Telehealth: Payer: Self-pay

## 2024-04-30 ENCOUNTER — Encounter: Payer: Self-pay | Admitting: Sports Medicine

## 2024-04-30 VITALS — BP 122/50 | HR 72 | Temp 97.4°F | Resp 12 | Ht 60.6 in | Wt 121.2 lb

## 2024-04-30 DIAGNOSIS — R002 Palpitations: Secondary | ICD-10-CM | POA: Diagnosis not present

## 2024-04-30 DIAGNOSIS — R0982 Postnasal drip: Secondary | ICD-10-CM | POA: Diagnosis not present

## 2024-04-30 DIAGNOSIS — Z780 Asymptomatic menopausal state: Secondary | ICD-10-CM

## 2024-04-30 DIAGNOSIS — G8929 Other chronic pain: Secondary | ICD-10-CM

## 2024-04-30 DIAGNOSIS — M545 Low back pain, unspecified: Secondary | ICD-10-CM | POA: Diagnosis not present

## 2024-04-30 DIAGNOSIS — F411 Generalized anxiety disorder: Secondary | ICD-10-CM

## 2024-04-30 DIAGNOSIS — G629 Polyneuropathy, unspecified: Secondary | ICD-10-CM

## 2024-04-30 DIAGNOSIS — Z0000 Encounter for general adult medical examination without abnormal findings: Secondary | ICD-10-CM

## 2024-04-30 DIAGNOSIS — E785 Hyperlipidemia, unspecified: Secondary | ICD-10-CM

## 2024-04-30 NOTE — Telephone Encounter (Signed)
 Copied from CRM [id number]. Topic: Clinical - Request for Lab/Test Order >> Apr 30, 2024  3:10 PM Cherylann RAMAN wrote: Reason for CRM: Patient is requesting a Calcium CT Scan. Patient is aware that her insurance does not cover the exam but she will cover the cost. Please contact patient at [phone]

## 2024-04-30 NOTE — Telephone Encounter (Signed)
 Copied from CRM [id number]. Topic: Clinical - Request for Lab/Test Order >> Apr 30, 2024 11:01 AM Cherylann RAMAN wrote: Reason for CRM: Patient is requesting a bone density test. Last was 07/12. Please contact patient at [phone] for additional information.

## 2024-04-30 NOTE — Progress Notes (Signed)
 Careteam: Patient Care Team: Sherlynn Madden, MD as PCP - General (Internal Medicine) Lynnell Nottingham, MD as Consulting Physician (Dermatology) Court Dorn PARAS, MD as Consulting Physician (Cardiology) Glendia Simmonds, OD as Referring Physician (Optometry)  PLACE OF SERVICE:  [facility]  Advanced Directive information    Allergies  Allergen Reactions   Aricept  [Donepezil ] Rash   Fosamax  [Alendronate ] Rash    Chief Complaint  Patient presents with   Medical Management of Chronic Issues    6 month follow up.      Discussed the use of AI scribe software for clinical note transcription with the patient, who gave verbal consent to proceed.  History of Present Illness  Terry Edwards is an 88 year old female with neuropathy who presents with concerns about medication side effects and neuropathic symptoms.  She experiences tingling and numbness primarily affecting the bottom of both feet, with the right foot being worse. The symptoms are localized to the feet and do not radiate upwards. She has not taken gabapentin  due to concerns about potential side effects, as she has experienced adverse reactions to medications in the past. She has tried lidocaine cream for relief, but found it uncomfortable and ineffective as she does not experience pain, only numbness.  She has a history of low back pain, which has improved significantly with physical therapy, including electrical stimulation. The pain is intermittent and does not radiate down her legs. She experiences occasional urinary incontinence, which is being managed with nerve stimulation therapy through her gynecologist.  She reports occasional heart palpitations occurring about once a week, lasting a few minutes, without associated chest pain, shortness of breath, or dizziness. She does not consume caffeine and has a history of   Previous cardiac evaluations, including a Holter monitor and echocardiogram, showed no abnormal heart  rhythms. Need records.  She experiences mild anxiety, with symptoms occurring three to four times a week, depending on her circumstances. She does not find it difficult to relax and does not feel irritable or worried about something awful happening.  She has a history of seasonal allergies, particularly in the fall, and uses over-the-counter medications for management. She denies any recent respiratory symptoms, gastrointestinal issues, or changes in appetite. She mentions a change in her voice, describing it as lower and foggy. She describes a sensation of something dripping down her throat when she lies down and reports her voice is lower and foggy. She does not smoke and has a good appetite.    Review of Systems:  Review of Systems  Constitutional:  Negative for chills and fever.  HENT:  Negative for congestion and sore throat.   Eyes:  Negative for double vision.  Respiratory:  Negative for cough, sputum production and shortness of breath.   Cardiovascular:  Positive for palpitations. Negative for chest pain and leg swelling.  Gastrointestinal:  Negative for abdominal pain, heartburn and nausea.  Genitourinary:  Negative for dysuria, frequency and hematuria.  Musculoskeletal:  Positive for back pain. Negative for falls and myalgias.  Neurological:  Negative for dizziness.   Negative unless indicated in HPI.   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   Heart murmur    Hemorrhoids    History of bone density study 2022   History of chicken pox    History of depression 2006   situational, on Lexapro for 1 year   History of mammogram 2024   History of MRI 2024   Right shoulder   Hyperlipidemia    Osteopenia     hx fx 5th MT R foot 03/2013   Osteoporosis    Plantar fasciitis of left foot    Per [facility] new patient packet   Right rotator cuff tendonitis 2013   resolved with PT (injections not helpful)   Urine incontinence    Vitamin D  deficiency    Past Surgical History:  Procedure Laterality Date   APPENDECTOMY  1957   BLEPHAROPLASTY     Per [facility] new patient packet   COLONOSCOPY     EYE SURGERY Bilateral 03/09/15, 03/23/15   cataract removal   TONSILLECTOMY  1940's   Social History:   reports that she has never smoked. She has never used smokeless tobacco. She reports that she does not currently use alcohol. She reports that she does not use drugs.  Family History  Problem Relation Age of Onset   Hypertension Mother    Hyperlipidemia Mother    Myelodysplastic syndrome Mother    Pneumonia Father    Hypertension Father    Hyperlipidemia Father    Diabetes Father    Clotting disorder Father    Suicidality Brother    Breast cancer Maternal Grandmother    Pneumonia Son    Parkinson's disease Son    Colon cancer Neg Hx    Esophageal cancer Neg Hx    Rectal cancer Neg Hx    Stomach cancer Neg Hx    Bladder Cancer Neg Hx    Uterine cancer Neg Hx    Renal cancer Neg Hx     Medications: Patient's Medications  New Prescriptions   No medications on file  Previous Medications   ASCORBIC ACID (VITAMIN C) 1000 MG TABLET    Take 1,000 mg by mouth daily.   BIOTIN 5000 MCG TABS    Take by mouth daily.    COLLAGEN-VITAMIN C-BIOTIN (COLLAGEN PO)    Take 20 g by mouth daily. Vital Proteins Collagen Peptides   GABAPENTIN  (NEURONTIN ) 100 MG CAPSULE    Take 1 capsule (100 mg total) by mouth at bedtime.   MAGNESIUM GLYCINATE PO    Take 400 mg by mouth 3 (three) times daily.   OVER THE COUNTER MEDICATION    Take 2 tablets by mouth 2 (two) times daily. Standard Process (BIOST) Skeletal & Cellular Health   OVER THE COUNTER MEDICATION    Take 1 tablet by mouth in the morning and at bedtime. Standard  Process Calcium Metabolism Health (Cal-Ma Plus)   OVER THE COUNTER MEDICATION    Take 2 tablets by mouth 2 (two) times daily. Standard Process Inflammatory Response Support (Cataplex-F)   OVER THE COUNTER MEDICATION    Take 1 tablet by mouth daily. Standard Process Supports energy cell production (Organically bound minerals)   OVER THE COUNTER MEDICATION    Take 2 tablets by mouth 2 (two) times daily. Standard Process For strong, Healthy bones ( Calcifood)   OVER THE COUNTER MEDICATION    Take 2 tablets by mouth daily. Standard Process Detoxification (Chlorophyll Complex)   OVER THE COUNTER MEDICATION    Take 2 tablets by mouth in the morning and at bedtime. With a  meal [ Enzyme Digestive Support ] ( Zypan )   OVER THE COUNTER MEDICATION    Take 2 Pieces by mouth daily. Inflammatory response support ( Cod Liver Oil ) Softgels   OVER THE COUNTER MEDICATION    Take 3 mg by mouth. Cyruta   TRIAMCINOLONE  CREAM (KENALOG ) 0.1 %    Apply 1 Application topically as needed.   TURMERIC PO    Take 4,500 mg by mouth daily.   VITAMIN D  PO    Take 5,000 Units by mouth daily. Softgels  Modified Medications   No medications on file  Discontinued Medications   No medications on file    Physical Exam: There were no vitals filed for this visit. There is no height or weight on file to calculate BMI. BP Readings from Last 3 Encounters:  03/18/24 (!) 146/79  01/23/24 132/78  11/28/23 131/78   Wt Readings from Last 3 Encounters:  03/07/24 126 lb (57.2 kg)  11/01/23 126 lb 9.6 oz (57.4 kg)  12/27/22 129 lb 6.4 oz (58.7 kg)    Physical Exam Constitutional:      Appearance: Normal appearance.  HENT:     Head: Normocephalic and atraumatic.  Cardiovascular:     Rate and Rhythm: Normal rate and regular rhythm.  Pulmonary:     Effort: Pulmonary effort is normal. No respiratory distress.     Breath sounds: Normal breath sounds. No wheezing.  Abdominal:     General: Bowel sounds are normal. There is no  distension.     Tenderness: There is no abdominal tenderness. There is no guarding or rebound.     Comments:    Musculoskeletal:        General: No swelling or tenderness.  Neurological:     Mental Status: She is alert. Mental status is at baseline.     Motor: No weakness.     Labs reviewed: Basic Metabolic Panel: Recent Labs    11/03/23 0848  NA 143  K 4.2  CL 108  CO2 28  GLUCOSE 87  BUN 22  CREATININE 0.67  CALCIUM 9.7  TSH 2.49   Liver Function Tests: Recent Labs    11/03/23 0848  AST 18  ALT 11  BILITOT 0.5  PROT 6.4   No results for input(s): LIPASE, AMYLASE in the last 8760 hours. No results for input(s): AMMONIA in the last 8760 hours. CBC: Recent Labs    11/03/23 0848  WBC 4.8  HGB 13.7  HCT 41.2  MCV 92.6  PLT 217   Lipid Panel: Recent Labs    11/03/23 0848 04/22/24 1443  CHOL 262* 223*  HDL 88 82  LDLCALC 158* 125*  TRIG 66 69  CHOLHDL 3.0 2.7   TSH: Recent Labs    11/03/23 0848  TSH 2.49   A1C: Lab Results  Component Value Date   HGBA1C 5.4 11/03/2023    Assessment and Plan Assessment & Plan   1. Chronic bilateral low back pain without sciatica (Primary)  No red flag signs  Cont with home exercise regimen Cont with tylenol prn  2. Neuropathy  Pt does want to try gabapentin  due to side effects Says  Lidocaine cream uncomfortable and ineffective. She wants a referral to neurology  3. Palpitations  Intermittent palpitations weekly, lasting minutes without chest pain, shortness of breath, or dizziness.   will get EKG   - Advise keeping a diary of palpitations, noting frequency, duration, associated symptoms, and activities. -Follow up with cardiology - EKG 12-Lead -  sinus rhythm   4. GAD (generalized anxiety disorder)  gAD 7  Declined behavioral therapy  5. Post-nasal drip  Instructed to start flonase, claritin   Other orders - OVER THE COUNTER MEDICATION; Take 3 mg by mouth. Cyruta - triamcinolone  cream  (KENALOG ) 0.1 %; Apply 1 Application topically as needed.

## 2024-04-30 NOTE — Telephone Encounter (Signed)
 Informed patient that she needs to speak with her cardiology for the test this morning

## 2024-05-01 ENCOUNTER — Encounter: Payer: Self-pay | Admitting: Sports Medicine

## 2024-05-02 ENCOUNTER — Ambulatory Visit
Admission: RE | Admit: 2024-05-02 | Discharge: 2024-05-02 | Disposition: A | Discharge status: Home / Self Care | Source: Ambulatory Visit | Attending: Family Medicine | Admitting: Family Medicine

## 2024-05-02 DIAGNOSIS — Z0000 Encounter for general adult medical examination without abnormal findings: Secondary | ICD-10-CM

## 2024-05-02 DIAGNOSIS — Z1231 Encounter for screening mammogram for malignant neoplasm of breast: Secondary | ICD-10-CM | POA: Diagnosis not present

## 2024-05-02 NOTE — Telephone Encounter (Signed)
 CAC testing is generally most useful for individuals at intermediate (10-year ASCVD risk 7.5-20%) or borderline (5-7.5%) risk, but its utility in those >=85 years is limited,  There is limited evidence specifically addressing the value of CAC scoring in asymptomatic individuals >=85 years, and further research may be needed for this age group.

## 2024-05-02 NOTE — Addendum Note (Signed)
 Addended by: Rozlyn Yerby on: 05/02/2024 03:30 PM   Modules accepted: Orders

## 2024-05-07 ENCOUNTER — Encounter: Admitting: Physical Therapy

## 2024-05-14 ENCOUNTER — Telehealth: Payer: Self-pay

## 2024-05-14 DIAGNOSIS — M810 Age-related osteoporosis without current pathological fracture: Secondary | ICD-10-CM

## 2024-05-14 NOTE — Telephone Encounter (Signed)
 Message routed to covering provider Roxan Plough, NP. Please place order for [facility]. I will send to referral coordinator after so she can follow up with patient.

## 2024-05-14 NOTE — Telephone Encounter (Signed)
 Copied from CRM [id number]. Topic: Clinical - Request for Lab/Test Order >> May 14, 2024 11:50 AM Susanna ORN wrote: Reason for CRM: Patient called stating that Dr. Sherlynn ordered a bone density scan for her but she can't get an appt until April 2026. Patient states she called [facility] and they are able to get her in next week. Patient wants to know if Dr. Sherlynn can send the orders to [facility] so she can have it done next week. The fax number for [facility] is [phone].

## 2024-05-14 NOTE — Telephone Encounter (Signed)
 Bone density ordered to be send to [facility]

## 2024-05-14 NOTE — Telephone Encounter (Signed)
 Message routed to referral coordinator Tobias Ally.

## 2024-05-21 ENCOUNTER — Ambulatory Visit (HOSPITAL_COMMUNITY)
Admission: RE | Admit: 2024-05-21 | Discharge: 2024-05-21 | Disposition: A | Discharge status: Home / Self Care | Payer: Self-pay | Source: Ambulatory Visit | Attending: Sports Medicine | Admitting: Sports Medicine

## 2024-05-21 DIAGNOSIS — Z136 Encounter for screening for cardiovascular disorders: Secondary | ICD-10-CM | POA: Insufficient documentation

## 2024-05-21 DIAGNOSIS — E785 Hyperlipidemia, unspecified: Secondary | ICD-10-CM | POA: Insufficient documentation

## 2024-05-22 ENCOUNTER — Encounter: Payer: Self-pay | Admitting: Obstetrics and Gynecology

## 2024-05-22 ENCOUNTER — Ambulatory Visit: Admitting: Obstetrics and Gynecology

## 2024-05-22 VITALS — BP 135/69 | HR 69

## 2024-05-22 DIAGNOSIS — R350 Frequency of micturition: Secondary | ICD-10-CM

## 2024-05-22 DIAGNOSIS — R3915 Urgency of urination: Secondary | ICD-10-CM

## 2024-05-22 DIAGNOSIS — N3281 Overactive bladder: Secondary | ICD-10-CM

## 2024-05-22 NOTE — Progress Notes (Signed)
 [facility]  PTNS VISIT  CC:  Overactive bladder  88 y.o. with refractory overactive bladder who presents for percutaneous tibial nerve stimulation. The patient presents for PTNS maintenance session # 5.   Procedure: The patient was placed in the sitting position and the right lower extremity was prepped in the usual fashion. The PTNS needle was then inserted at a 60 degree angle, 5 cm cephalad and 2 cm posterior to the medial malleolus. The PTNS unit was then programmed and an optimal response was noted at 8 milliamps. The PTNS stimulation was then performed at this setting for 30 minutes without incident and the patient tolerated the procedure well. The needle was removed and hemostasis was noted.   The pt will return in 6 weeks for PTNS maintenance session #6 . All questions were answered.

## 2024-05-24 ENCOUNTER — Ambulatory Visit: Payer: Self-pay | Admitting: Sports Medicine

## 2024-06-11 DIAGNOSIS — M810 Age-related osteoporosis without current pathological fracture: Secondary | ICD-10-CM | POA: Diagnosis not present

## 2024-06-11 LAB — HM DEXA SCAN

## 2024-06-14 ENCOUNTER — Encounter: Payer: Self-pay | Admitting: Sports Medicine

## 2024-06-19 DIAGNOSIS — H52223 Regular astigmatism, bilateral: Secondary | ICD-10-CM | POA: Diagnosis not present

## 2024-06-24 ENCOUNTER — Ambulatory Visit: Payer: Self-pay | Admitting: Sports Medicine

## 2024-06-25 DIAGNOSIS — Z0100 Encounter for examination of eyes and vision without abnormal findings: Secondary | ICD-10-CM | POA: Diagnosis not present

## 2024-06-27 ENCOUNTER — Encounter: Payer: Self-pay | Admitting: Sports Medicine

## 2024-06-28 ENCOUNTER — Other Ambulatory Visit: Payer: Self-pay | Admitting: Sports Medicine

## 2024-06-28 MED ORDER — ALENDRONATE SODIUM 70 MG PO TABS: 70.0000 mg | ORAL_TABLET | ORAL | 11 refills | Status: AC

## 2024-07-01 ENCOUNTER — Encounter: Payer: Self-pay | Admitting: Sports Medicine

## 2024-07-02 ENCOUNTER — Encounter: Payer: Self-pay | Admitting: Obstetrics and Gynecology

## 2024-07-02 ENCOUNTER — Ambulatory Visit: Admitting: Obstetrics and Gynecology

## 2024-07-02 VITALS — BP 132/78 | HR 60

## 2024-07-02 DIAGNOSIS — N3941 Urge incontinence: Secondary | ICD-10-CM | POA: Diagnosis not present

## 2024-07-02 DIAGNOSIS — N3281 Overactive bladder: Secondary | ICD-10-CM

## 2024-07-02 DIAGNOSIS — R350 Frequency of micturition: Secondary | ICD-10-CM | POA: Diagnosis not present

## 2024-07-02 DIAGNOSIS — R3915 Urgency of urination: Secondary | ICD-10-CM

## 2024-07-02 MED ORDER — VIBEGRON 75 MG PO TABS: 75.0000 mg | ORAL_TABLET | Freq: Every day | ORAL | 5 refills | Status: AC

## 2024-07-02 NOTE — Progress Notes (Signed)
 [facility] Return Visit  SUBJECTIVE  History of Present Illness: Terry Edwards is a 88 y.o. female seen in follow-up for OAB. Plan at last visit was continue PTNS at 6 week intervals if working. Patient reports her urgency and frequency has not been well controlled. She has been going 12 times during the day with urgency and 1-2 times nightly.      Past Medical History: Patient  has a past medical history of Allergy, Arthritis, Cancer (HCC), Cataract, Chronic hepatitis B without hepatic coma (HCC) (1970s), Cyst of pineal gland, Heart murmur, Hemorrhoids, History of bone density study (2022), History of chicken pox, History of depression (2006), History of mammogram (2024), History of MRI (2024), Hyperlipidemia, Osteopenia, Osteoporosis, Plantar fasciitis of left foot, Right rotator cuff tendonitis (2013), Urine incontinence, and Vitamin D  deficiency.   Past Surgical History: She  has a past surgical history that includes Appendectomy (8042); Tonsillectomy (1940's); Eye surgery (Bilateral, 03/09/15, 03/23/15); Blepharoplasty; and Colonoscopy.   Medications: She has a current medication list which includes the following prescription(s): alendronate , vitamin c, magnesium glycinate, OVER THE COUNTER MEDICATION, OVER THE COUNTER MEDICATION, OVER THE COUNTER MEDICATION, OVER THE COUNTER MEDICATION, OVER THE COUNTER MEDICATION, OVER THE COUNTER MEDICATION, OVER THE COUNTER MEDICATION, OVER THE COUNTER MEDICATION, OVER THE COUNTER MEDICATION, triamcinolone  cream, vibegron, and vitamin d .   Allergies: Patient is allergic to aricept  [donepezil ] and fosamax  [alendronate ].   Social History: Patient  reports that she has never smoked. She has never used smokeless tobacco. She reports that she does not currently use alcohol. She reports that she does not use drugs.     OBJECTIVE     Physical Exam: Vitals:   07/02/24 1005  BP: 132/78  Pulse: 60   Gen: No apparent distress, A&O x  3.  Detailed Urogynecologic Evaluation:  Deferred.    ASSESSMENT AND PLAN    [name] is a 88 y.o. with:  1. Overactive bladder   2. Urinary urgency   3. Urinary frequency   4. Urge incontinence    Patient has tried PTNS for over a year now, she had some mild improvement after the initial 12 weeks but has since had an increase in urgency frequency.  Patient is open to trying PT again with another provider. Will send a referral to see if she can see Channing Pereyra.  Patient is open to trying medications. She is not a candidate for anticholinergic medications due to her age and risk for dizziness and cognitive impairment. Options between Gemtesa 75mg  and Myrbetriq 50mg  daily. Gemtesa has less of a side effect profile, will send this in and give patient 2 weeks of samples to start with. She can also consider pumpkin seed extract up to 5gm daily.  We did discuss tertiary therapies such as bladder botox and SNM and she is not currently interested in either option.   Patient to return in 6 weeks for medication check.   Orien Mayhall G Jasime Westergren, NP

## 2024-07-02 NOTE — Patient Instructions (Signed)
 Consider adding in pumpkin seed extract up to 5gm daily.   I have sent a new referral for PT for Terry Edwards.  Consider starting Gemtesa 75mg  daily for OAB symptoms.

## 2024-07-04 DIAGNOSIS — M545 Low back pain, unspecified: Secondary | ICD-10-CM | POA: Diagnosis not present

## 2024-07-04 DIAGNOSIS — R011 Cardiac murmur, unspecified: Secondary | ICD-10-CM | POA: Diagnosis not present

## 2024-07-04 DIAGNOSIS — E785 Hyperlipidemia, unspecified: Secondary | ICD-10-CM | POA: Diagnosis not present

## 2024-07-04 DIAGNOSIS — Z833 Family history of diabetes mellitus: Secondary | ICD-10-CM | POA: Diagnosis not present

## 2024-07-04 DIAGNOSIS — G629 Polyneuropathy, unspecified: Secondary | ICD-10-CM | POA: Diagnosis not present

## 2024-07-04 DIAGNOSIS — M810 Age-related osteoporosis without current pathological fracture: Secondary | ICD-10-CM | POA: Diagnosis not present

## 2024-07-04 DIAGNOSIS — N3941 Urge incontinence: Secondary | ICD-10-CM | POA: Diagnosis not present

## 2024-07-04 DIAGNOSIS — R030 Elevated blood-pressure reading, without diagnosis of hypertension: Secondary | ICD-10-CM | POA: Diagnosis not present

## 2024-07-04 DIAGNOSIS — Z85828 Personal history of other malignant neoplasm of skin: Secondary | ICD-10-CM | POA: Diagnosis not present

## 2024-07-04 DIAGNOSIS — F411 Generalized anxiety disorder: Secondary | ICD-10-CM | POA: Diagnosis not present

## 2024-07-04 DIAGNOSIS — B182 Chronic viral hepatitis C: Secondary | ICD-10-CM | POA: Diagnosis not present

## 2024-07-04 DIAGNOSIS — Z8249 Family history of ischemic heart disease and other diseases of the circulatory system: Secondary | ICD-10-CM | POA: Diagnosis not present

## 2024-07-12 ENCOUNTER — Encounter: Payer: Self-pay | Admitting: Obstetrics and Gynecology

## 2024-07-12 ENCOUNTER — Encounter: Payer: Self-pay | Admitting: Sports Medicine

## 2024-07-12 DIAGNOSIS — M75121 Complete rotator cuff tear or rupture of right shoulder, not specified as traumatic: Secondary | ICD-10-CM | POA: Diagnosis not present

## 2024-07-16 DIAGNOSIS — M5441 Lumbago with sciatica, right side: Secondary | ICD-10-CM | POA: Diagnosis not present

## 2024-07-22 ENCOUNTER — Encounter: Payer: Self-pay | Admitting: Obstetrics and Gynecology

## 2024-07-26 DIAGNOSIS — M545 Low back pain, unspecified: Secondary | ICD-10-CM | POA: Diagnosis not present

## 2024-08-07 DIAGNOSIS — M25611 Stiffness of right shoulder, not elsewhere classified: Secondary | ICD-10-CM | POA: Diagnosis not present

## 2024-08-07 DIAGNOSIS — R29898 Other symptoms and signs involving the musculoskeletal system: Secondary | ICD-10-CM | POA: Diagnosis not present

## 2024-08-07 DIAGNOSIS — M25511 Pain in right shoulder: Secondary | ICD-10-CM | POA: Diagnosis not present

## 2024-08-12 ENCOUNTER — Ambulatory Visit: Admitting: Obstetrics and Gynecology

## 2024-08-12 VITALS — BP 124/74 | HR 92

## 2024-08-12 DIAGNOSIS — R350 Frequency of micturition: Secondary | ICD-10-CM

## 2024-08-12 DIAGNOSIS — R3915 Urgency of urination: Secondary | ICD-10-CM

## 2024-08-12 DIAGNOSIS — N3281 Overactive bladder: Secondary | ICD-10-CM | POA: Diagnosis not present

## 2024-08-12 NOTE — Progress Notes (Signed)
 [facility] Return Visit  SUBJECTIVE  History of Present Illness: Terry Edwards is a 88 y.o. female seen in follow-up for OAB. Plan at last visit was start Gemtesa 75mg  daily. She did two weeks of samples which she reports really seemed to alleviate her symptoms. She states she did well for a few weeks and then paid the >300$ cost for her medication. She states she started over a week ago having symptoms of pain in her abdomen and a constant runny nose.   Patient reports she also just restarted Fosamax  in the last few weeks.   Past Medical History: Patient  has a past medical history of Allergy, Arthritis, Cancer (HCC), Cataract, Chronic hepatitis B without hepatic coma (HCC) (1970s), Cyst of pineal gland, Heart murmur, Hemorrhoids, History of bone density study (2022), History of chicken pox, History of depression (2006), History of mammogram (2024), History of MRI (2024), Hyperlipidemia, Osteopenia, Osteoporosis, Plantar fasciitis of left foot, Right rotator cuff tendonitis (2013), Urine incontinence, and Vitamin D  deficiency.   Past Surgical History: She  has a past surgical history that includes Appendectomy (8042); Tonsillectomy (1940's); Eye surgery (Bilateral, 03/09/15, 03/23/15); Blepharoplasty; and Colonoscopy.   Medications: She has a current medication list which includes the following prescription(s): alendronate , vitamin c, magnesium glycinate, OVER THE COUNTER MEDICATION, OVER THE COUNTER MEDICATION, OVER THE COUNTER MEDICATION, OVER THE COUNTER MEDICATION, OVER THE COUNTER MEDICATION, OVER THE COUNTER MEDICATION, OVER THE COUNTER MEDICATION, OVER THE COUNTER MEDICATION, OVER THE COUNTER MEDICATION, triamcinolone  cream, vibegron, and vitamin d .   Allergies: Patient is allergic to aricept  [donepezil ] and fosamax  [alendronate ].   Social History: Patient  reports that she has never smoked. She has never used smokeless tobacco. She reports that she does not currently use  alcohol. She reports that she does not use drugs.     OBJECTIVE     Physical Exam: Vitals:   08/12/24 1254  BP: 124/74  Pulse: 92   Gen: No apparent distress, A&O x 3.  Detailed Urogynecologic Evaluation:  Deferred.    ASSESSMENT AND PLAN    Terry Edwards is a 88 y.o. with:  1. Overactive bladder   2. Urinary urgency   3. Urinary frequency    We discussed options of trialing the medication in the evening so it is separate from the Fosamax , since that is when her stomach pains started. She is open to continuing to try the medication and taking it at a different time. My concern is that the culprit of the abdominal pain is her Fosamax .  If the Louanne is not affordable with tier exception that we have requested, or she continues to have symptoms even with change in medication time, then we will consider options between botox and SNM.  Will plan for patient to have a follow up scheduled with Dr. Marilynne in case she needs to discuss other tertiary therapies and best direction for her care.    Patient to follow up in 6 weeks with Dr. Marilynne or sooner if needed.   Terry Claw G Bentlie Withem, NP

## 2024-08-15 ENCOUNTER — Other Ambulatory Visit: Payer: Self-pay

## 2024-08-15 ENCOUNTER — Emergency Department (HOSPITAL_BASED_OUTPATIENT_CLINIC_OR_DEPARTMENT_OTHER)
Admission: EM | Admit: 2024-08-15 | Discharge: 2024-08-15 | Disposition: A | Discharge status: Home / Self Care | Attending: Emergency Medicine | Admitting: Emergency Medicine

## 2024-08-15 ENCOUNTER — Emergency Department (HOSPITAL_BASED_OUTPATIENT_CLINIC_OR_DEPARTMENT_OTHER)

## 2024-08-15 DIAGNOSIS — R1013 Epigastric pain: Secondary | ICD-10-CM | POA: Diagnosis not present

## 2024-08-15 DIAGNOSIS — R10811 Right upper quadrant abdominal tenderness: Secondary | ICD-10-CM | POA: Diagnosis not present

## 2024-08-15 DIAGNOSIS — R1011 Right upper quadrant pain: Secondary | ICD-10-CM | POA: Diagnosis not present

## 2024-08-15 DIAGNOSIS — R110 Nausea: Secondary | ICD-10-CM | POA: Diagnosis not present

## 2024-08-15 DIAGNOSIS — Z6820 Body mass index (BMI) 20.0-20.9, adult: Secondary | ICD-10-CM | POA: Diagnosis not present

## 2024-08-15 LAB — COMPREHENSIVE METABOLIC PANEL WITH GFR
ALT: 48 U/L — ABNORMAL HIGH (ref 0–44)
AST: 34 U/L (ref 15–41)
Albumin: 3.8 g/dL (ref 3.5–5.0)
Alkaline Phosphatase: 102 U/L (ref 38–126)
Anion gap: 10 (ref 5–15)
BUN: 16 mg/dL (ref 8–23)
CO2: 26 mmol/L (ref 22–32)
Calcium: 9.5 mg/dL (ref 8.9–10.3)
Chloride: 106 mmol/L (ref 98–111)
Creatinine, Ser: 0.63 mg/dL (ref 0.44–1.00)
GFR, Estimated: >60 mL/min (ref >60)
Glucose, Bld: 129 mg/dL — ABNORMAL HIGH (ref 70–99)
Potassium: 4.1 mmol/L (ref 3.5–5.1)
Sodium: 142 mmol/L (ref 135–145)
Total Bilirubin: 0.4 mg/dL (ref 0.0–1.2)
Total Protein: 6.7 g/dL (ref 6.5–8.1)

## 2024-08-15 LAB — URINALYSIS, ROUTINE W REFLEX MICROSCOPIC
Bilirubin Urine: NEGATIVE
Glucose, UA: NEGATIVE mg/dL
Ketones, ur: NEGATIVE mg/dL
Nitrite: NEGATIVE
Protein, ur: NEGATIVE mg/dL
Specific Gravity, Urine: 1.023 (ref 1.005–1.030)
pH: 6.5 (ref 5.0–8.0)

## 2024-08-15 LAB — CBC
HCT: 37.8 % (ref 36.0–46.0)
Hemoglobin: 12.5 g/dL (ref 12.0–15.0)
MCH: 30.1 pg (ref 26.0–34.0)
MCHC: 33.1 g/dL (ref 30.0–36.0)
MCV: 91.1 fL (ref 80.0–100.0)
Platelets: 253 K/uL (ref 150–400)
RBC: 4.15 MIL/uL (ref 3.87–5.11)
RDW: 13.6 % (ref 11.5–15.5)
WBC: 5 K/uL (ref 4.0–10.5)
nRBC: 0 % (ref 0.0–0.2)

## 2024-08-15 LAB — LIPASE, BLOOD: Lipase: 20 U/L (ref 11–51)

## 2024-08-15 MED ORDER — ALUM & MAG HYDROXIDE-SIMETH 200-200-20 MG/5ML PO SUSP
30.0000 mL | Freq: Once | ORAL | Status: AC
  Administered 2024-08-15: 30 mL via ORAL
  Filled 2024-08-15: qty 30

## 2024-08-15 MED ORDER — PANTOPRAZOLE SODIUM 20 MG PO TBEC: 20.0000 mg | DELAYED_RELEASE_TABLET | Freq: Every day | ORAL | 0 refills | Status: AC

## 2024-08-15 MED ORDER — PANTOPRAZOLE SODIUM 40 MG PO TBEC
40.0000 mg | DELAYED_RELEASE_TABLET | Freq: Once | ORAL | Status: AC
  Administered 2024-08-15: 40 mg via ORAL
  Filled 2024-08-15: qty 1

## 2024-08-15 NOTE — ED Triage Notes (Addendum)
 Patient reports RUQ pain for the past week with nausea. Went to urgent care and they were concerned for gallbladder issues. Patient also states she has lost 20 lbs this year without trying

## 2024-08-15 NOTE — Progress Notes (Signed)
 [facility] Visit Note    Terry Edwards is a 88 y.o. female who presents with self.   History obtained from patient.   Assessment & Plan:    Assessment & Plan Nausea     Right upper quadrant abdominal tenderness, unspecified whether rebound tenderness present Patient referred to a higher level of care. Discussed exam findings. Rationale for referral discussed. Discussed needs imaging and labs to evaluate cause of her symptoms. She verbalized understanding. PCP was not able to see her      No follow-ups on file.  Subjective     Started fosamax  x 3 weeks and gemtesa 10/16.   GI Symptoms Duration of current symptoms:  10 weeks Abdominal pain: No (upset stomach, no pain)   Associated symptoms: nausea   Associated symptoms: no abdominal pain (upset stomach, no pain), no belching, no bloating, no myalgias, no change in bowel habits, no chills, no constipation, no cough, no appetite change, no diarrhea, no trouble swallowing, no ear pain, no fatigue, no fever, no flank pain, no flatus, no headaches, no breathy voice, no malaise, no congestion, no odynophagia, no rash, no reflux/regurgitation, no rhinorrhea, no sore throat, no diaphoresis, no swollen glands, no vomiting and no other   Red flag features: no blood in stool, no unintentional weight loss and no chest pain   Treatments tried: stopped fosamax  and gemtesa (urogyncologist said symptoms likely caused by fosamax  per pt) Response to treatment:  Relieved symptoms Relief: mild relief      Allergies[1]  Review of Systems  Constitutional:  Negative for appetite change, chills, diaphoresis, fatigue and fever.  HENT:  Negative for congestion, ear pain, rhinorrhea, sore throat and trouble swallowing.   Respiratory:  Negative for cough.   Gastrointestinal:  Positive for nausea. Negative for abdominal pain (upset stomach, no pain), bloating, constipation, diarrhea, flatus and vomiting.  Genitourinary:  Negative for flank pain.   Musculoskeletal:  Negative for myalgias.  Skin:  Negative for rash.  Neurological:  Negative for headaches.     Objective     Vital Signs: BP 142/78   Pulse 95   Temp 97.1 F (36.2 C) (Tympanic)   Resp 20   Ht 5' 1.25 (1.556 m)   Wt 112 lb (50.8 kg)   SpO2 95%   BMI 20.99 kg/m    Physical Exam Constitutional:      General: She is not in acute distress.    Appearance: Normal appearance. She is not ill-appearing, toxic-appearing or diaphoretic.  HENT:     Head: Normocephalic and atraumatic.  Cardiovascular:     Rate and Rhythm: Normal rate and regular rhythm.     Heart sounds: Normal heart sounds.  Pulmonary:     Effort: Pulmonary effort is normal.     Breath sounds: Normal breath sounds and air entry.  Abdominal:     General: Abdomen is flat. Bowel sounds are normal.     Palpations: Abdomen is soft.     Tenderness: There is abdominal tenderness in the right upper quadrant.      Comments: Reports when she gets an upset belly she feels tenderness on LUQ Denies that pain radiates Area felt firm when palpated  Skin:    General: Skin is warm.     Capillary Refill: Capillary refill takes less than 2 seconds.  Neurological:     Mental Status: She is alert.                         [  1] Allergies Allergen Reactions  . Donepezil  Hives and Rash  . Alendronate  Sodium Hives  . Mold Other (See Comments)  . Alendronate  Rash

## 2024-08-15 NOTE — Telephone Encounter (Signed)
 [facility] is currently waiting on a appeal response.

## 2024-08-15 NOTE — Discharge Instructions (Signed)
 It was a pleasure taking care of you today. You were seen in the Emergency Department for evaluation of abdominal pain. Your work-up was reassuring. Your ultrasound showed no evidence of gallstones or gallbladder wall thickening.  Your labs were unremarkable.  Your urine is not infected.  I suspect the Fosamax  was the culprit of your abdominal pain.  However, given that your symptoms are consistent with acid reflux/GERD, I am sending you a short-term prescription for Protonix, you will take this medication once a day for a month. Refer to the attached documentation for further management of your symptoms. Follow up with your PCP if your symptoms continue.  Please return to the ER if you experience chest pain, trouble breathing, intractable nausea/vomiting or any other life threatening illnesses.

## 2024-08-15 NOTE — ED Provider Notes (Signed)
 [place] EMERGENCY DEPARTMENT AT [facility] Provider Note   CSN: [id number] Arrival date & time: 08/15/24  1342     Patient presents with: Abdominal Pain   Terry Edwards is a 88 y.o. female with past medical history of OAB, hyperlipidemia, osteoporosis, urge incontinence, thoracic aortic aneurysm who presents the emergency department for evaluation of abdominal pain.  Patient states she recently started Fosamax  for osteoporosis and Gemtesa for OAB and correlates her symptoms to those medications start dates.  She started Fosamax  on 10/13 and Gemtesa on 10/26.  She reports nausea and abdominal pain.  She describes the pain as burning primarily epigastric and left upper quadrant.  She stopped taking both these medications 4 days ago, on 11/9 due to the stomach upset.  Patient reports she has had issues with GERD in the past, but does not currently take any medication.  She [facility] this morning and was told this may be gallbladder related so she was sent to the ED for further evaluation.  Patient does have a history of appendectomy.   Abdominal Pain      Prior to Admission medications   Medication Sig Start Date End Date Taking? Authorizing Provider  pantoprazole (PROTONIX) 20 MG tablet Take 1 tablet (20 mg total) by mouth daily. 08/15/24  Yes Bell Carbo, Marry RAMAN, PA-C  alendronate  (FOSAMAX ) 70 MG tablet Take 1 tablet (70 mg total) by mouth every 7 (seven) days. Take with a full glass of water on an empty stomach. 06/28/24   Sherlynn Madden, MD  Ascorbic Acid (VITAMIN C) 1000 MG tablet Take 1,000 mg by mouth daily.    [provider]  MAGNESIUM GLYCINATE PO Take 400 mg by mouth 3 (three) times daily.    [provider]  OVER THE COUNTER MEDICATION Take 2 tablets by mouth 2 (two) times daily. Standard Process (BIOST) Skeletal & Cellular Health    [provider]  OVER THE COUNTER MEDICATION Take 1 tablet by mouth in the  morning and at bedtime. Standard Process Calcium Metabolism Health (Cal-Ma Plus)    [provider]  OVER THE COUNTER MEDICATION Take 2 tablets by mouth 2 (two) times daily. Standard Process Inflammatory Response Support (Cataplex-F)    [provider]  OVER THE COUNTER MEDICATION Take 1 tablet by mouth daily. Standard Process Supports energy cell production (Organically bound minerals)    [provider]  OVER THE COUNTER MEDICATION Take 2 tablets by mouth 2 (two) times daily. Standard Process For strong, Healthy bones ( Calcifood)    [provider]  OVER THE COUNTER MEDICATION Take 2 tablets by mouth daily. Standard Process Detoxification (Chlorophyll Complex)    [provider]  OVER THE COUNTER MEDICATION Take 2 tablets by mouth in the morning and at bedtime. With a meal [ Enzyme Digestive Support ] ( Zypan )    [provider]  OVER THE COUNTER MEDICATION Take 2 Pieces by mouth daily. Inflammatory response support ( Cod Liver Oil ) Softgels    [provider]  OVER THE COUNTER MEDICATION Take 3 mg by mouth. Cyruta    [provider]  triamcinolone  cream (KENALOG ) 0.1 % Apply 1 Application topically as needed. 03/21/24   [provider]  Vibegron 75 MG TABS Take 1 tablet (75 mg total) by mouth daily. 07/02/24   Zuleta, Kaitlin G, NP  VITAMIN D  PO Take 5,000 Units by mouth daily. Softgels    [provider]  Allergies: Aricept  [donepezil ] and Fosamax  [alendronate ]    Review of Systems  Gastrointestinal:  Positive for abdominal pain.    Updated Vital Signs BP (!) 146/82   Pulse 90   Temp 97.7 F (36.5 C) (Oral)   Resp 15   SpO2 97%   Physical Exam Vitals and nursing note reviewed.  Constitutional:      Appearance: Normal appearance.  HENT:     Head: Normocephalic and atraumatic.     Mouth/Throat:     Mouth: Mucous membranes are moist.  Eyes:     General: No scleral icterus.       Right  eye: No discharge.        Left eye: No discharge.     Conjunctiva/sclera: Conjunctivae normal.  Cardiovascular:     Rate and Rhythm: Normal rate and regular rhythm.     Pulses: Normal pulses.  Pulmonary:     Effort: Pulmonary effort is normal.     Breath sounds: Normal breath sounds.  Abdominal:     General: There is no distension.     Tenderness: There is no abdominal tenderness.     Comments: Abdomen nontender in all 4 quadrants and epigastrium.  Musculoskeletal:        General: No deformity.     Cervical back: Normal range of motion.  Skin:    General: Skin is warm and dry.     Capillary Refill: Capillary refill takes less than 2 seconds.  Neurological:     Mental Status: She is alert.     Motor: No weakness.  Psychiatric:        Mood and Affect: Mood normal.     (all labs ordered are listed, but only abnormal results are displayed) Labs Reviewed  COMPREHENSIVE METABOLIC PANEL WITH GFR - Abnormal; Notable for the following components:      Result Value   Glucose, Bld 129 (*)    ALT 48 (*)    All other components within normal limits  URINALYSIS, ROUTINE W REFLEX MICROSCOPIC - Abnormal; Notable for the following components:   Hgb urine dipstick TRACE (*)    Leukocytes,Ua TRACE (*)    Bacteria, UA RARE (*)    All other components within normal limits  LIPASE, BLOOD  CBC    EKG: None  Radiology: US  Abdomen Limited RUQ (LIVER/GB) Result Date: 08/15/2024 CLINICAL DATA:  Right upper quadrant abdominal pain. EXAM: ULTRASOUND ABDOMEN LIMITED RIGHT UPPER QUADRANT COMPARISON:  None Available. FINDINGS: Gallbladder: No gallstones or wall thickening visualized. No sonographic Murphy sign noted by sonographer. Common bile duct: Diameter: 8 mm Liver: No focal lesion identified. Within normal limits in parenchymal echogenicity. Portal vein is patent on color Doppler imaging with normal direction of blood flow towards the liver. Other: None. IMPRESSION: Unremarkable right upper  quadrant ultrasound. Electronically Signed   By: Vanetta Chou M.D.   On: 08/15/2024 15:49    Procedures   Medications Ordered in the ED  alum & mag hydroxide-simeth (MAALOX/MYLANTA) 200-200-20 MG/5ML suspension 30 mL (30 mLs Oral Given 08/15/24 1647)  pantoprazole (PROTONIX) EC tablet 40 mg (40 mg Oral Given 08/15/24 1647)                                  Medical Decision Making Amount and/or Complexity of Data Reviewed Labs: ordered.  Risk OTC drugs. Prescription drug management.   This patient presents to the ED for concern of abdominal pain, this  involves an extensive number of treatment options, and is a complaint that carries with it a high risk of complications and morbidity.   Differential diagnosis includes: Mesenteric ischemia, GERD, pancreatitis, cholecystitis, symptomatic cholelithiasis, gastroenteritis  Co morbidities:  history of osteoporosis, OAB   Additional history:  Patient was seen for OAB follow-up on 11/10 where recommendation was given for her to trial taking Gemtesa in the evening and Fosamax  in the morning.  However provider was concerned at that time is that Fosamax  is the culprit of her abdominal pain.  Lab Tests:  I Ordered, and personally interpreted labs.  The pertinent results include: No acute abnormalities  Imaging Studies:  I ordered imaging studies including RUQ ultrasound I independently visualized and interpreted imaging which showed no evidence of gallstones or gallbladder wall thickening I agree with the radiologist interpretation  Cardiac Monitoring/ECG:  The patient was maintained on a cardiac monitor.  I personally viewed and interpreted the cardiac monitored which showed an underlying rhythm of: Sinus tachycardia  Medicines ordered and prescription drug management:  I ordered medication including  Medications  alum & mag hydroxide-simeth (MAALOX/MYLANTA) 200-200-20 MG/5ML suspension 30 mL (30 mLs Oral Given 08/15/24 1647)   pantoprazole (PROTONIX) EC tablet 40 mg (40 mg Oral Given 08/15/24 1647)   for reflux symptoms Reevaluation of the patient after these medicines showed that the patient improved I have reviewed the patients home medicines and have made adjustments as needed  Test Considered:   CT abdomen pelvis  Critical Interventions:   none  Consultations Obtained: None  Problem List / ED Course:     ICD-10-CM   1. Epigastric pain  R10.13       MDM: 88 year old female who presents emergency department for evaluation of abdominal pain and nausea.  Patient started Fosamax  and Gemtesa approximately 1 month ago and correlated her symptom onset to the start of these medications.  She reports burning in her epigastrium and left upper quadrant.  Physical exam is unremarkable and patient has a nontender abdomen.  Of note, she was evaluated in a [facility] this morning who told her this may involve her gallbladder.  She had a right upper quadrant ultrasound which showed no evidence of gallstones or wall thickening.  Patient history and physical exam is consistent with GERD.  I ordered a GI cocktail and dose of Protonix.  Upon reevaluation, patient reported she was feeling significantly better and symptoms resolved.  Shared decision-making was utilized when determining whether patient would undergo CT scan.  Given that patient's exam was unremarkable and patient improved with GI cocktail, I decided not to pursue CT scan.  I am currently recommending patient not resume her Fosamax  as this is likely the culprit of her abdominal pain.  Additionally, I am giving her a 1 month prescription of Protonix for GERD symptoms.  Patient is agreeable to this plan and has verbalized her understanding.  I recommended she follow-up with her PCP if her symptoms do not improve over the next month.  Vital signs are stable.  Patient is appropriate for discharge at this time.   Dispostion:  After consideration of the diagnostic  results and the patients response to treatment, I feel that the patient would benefit from short course of Protonix and PCP follow-up if symptoms do not improve.    Final diagnoses:  Epigastric pain    ED Discharge Orders          Ordered    pantoprazole (PROTONIX) 20 MG tablet  Daily  08/15/24 1757               Torrence Marry RAMAN, PA-C 08/15/24 1802    Neysa Caron PARAS, DO 08/15/24 2308

## 2024-08-19 ENCOUNTER — Encounter: Payer: Self-pay | Admitting: Obstetrics and Gynecology

## 2024-08-21 NOTE — Telephone Encounter (Signed)
 I've spoken with patient in regards to the medication denial. Insurance does not allow tier exceptions for that medication. I will send her prescription to [facility] pharmacy.

## 2024-08-23 DIAGNOSIS — M75121 Complete rotator cuff tear or rupture of right shoulder, not specified as traumatic: Secondary | ICD-10-CM | POA: Diagnosis not present

## 2024-08-24 DIAGNOSIS — M545 Low back pain, unspecified: Secondary | ICD-10-CM | POA: Diagnosis not present

## 2024-08-24 DIAGNOSIS — M5441 Lumbago with sciatica, right side: Secondary | ICD-10-CM | POA: Diagnosis not present

## 2024-08-28 ENCOUNTER — Ambulatory Visit: Admitting: Physical Therapy

## 2024-09-03 ENCOUNTER — Ambulatory Visit: Admitting: Sports Medicine

## 2024-09-03 NOTE — Therapy (Unsigned)
 OUTPATIENT PHYSICAL THERAPY FEMALE PELVIC EVALUATION   Patient Name: Terry Edwards MRN: [id number] DOB:08-11-1936, 88 y.o., female Today's Date: 09/04/2024  END OF SESSION:  PT End of Session - 09/04/24 1022     Visit Number 1    Date for Recertification  11/27/24    Authorization Type [employer]    Authorization - Visit Number 1    Authorization - Number of Visits 10    PT Start Time 1015    PT Stop Time 1055    PT Time Calculation (min) 40 min          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   Cataract    removed bilateral eyes   Chronic hepatitis B without hepatic coma (HCC) 1970s   chronic elev AFP   Cyst of pineal gland    incidental on MRI   Heart murmur    Hemorrhoids    History of bone density study 2022   History of chicken pox    History of depression 2006   situational, on Lexapro for 1 year   History of mammogram 2024   History of MRI 2024   Right shoulder   Hyperlipidemia    Osteopenia    hx fx 5th MT R foot 03/2013   Osteoporosis    Plantar fasciitis of left foot    Per [facility] new patient packet   Right rotator cuff tendonitis 2013   resolved with PT (injections not helpful)   Urine incontinence    Vitamin D  deficiency    Past Surgical History:  Procedure Laterality Date   APPENDECTOMY  1957   BLEPHAROPLASTY     Per [facility] new patient packet   COLONOSCOPY     EYE SURGERY Bilateral 03/09/15, 03/23/15   cataract removal   TONSILLECTOMY  1940's   Patient Active Problem List   Diagnosis Date Noted   Hardening of the aorta (main artery of the heart) 06/14/2023   Thoracic aortic aneurysm 07/27/2022   Elevated blood pressure reading without diagnosis of hypertension 11/13/2021   Right hand pain 11/13/2021   Impingement syndrome of shoulder region 10/25/2021   AF (amaurosis fugax) 07/09/2021   Drug-induced skin rash 04/23/2021    Cyst of joint of hand, right 01/26/2021   Thinning hair 10/12/2020   Murmur, cardiac 10/12/2020   Traumatic incomplete tear of right rotator cuff 10/12/2020   Urge incontinence 10/12/2020   Routine general medical examination at a health care facility 01/09/2015   Chronic toe pain, right foot 09/03/2014   Cyst of pineal gland    Vitamin D  deficiency    Osteoporosis    Hx of type B viral hepatitis    Hyperlipidemia     PCP: None  REFERRING PROVIDER: Zuleta, Kaitlin G, NP   REFERRING DIAG:  N32.81 (ICD-10-CM) - Overactive bladder  R39.15 (ICD-10-CM) - Urinary urgency  R35.0 (ICD-10-CM) - Urinary frequency  N39.41 (ICD-10-CM) - Urge incontinence    THERAPY DIAG:  Muscle weakness (generalized) - Plan: PT plan of care cert/re-cert  Unspecified lack of coordination - Plan: PT plan of care cert/re-cert  Rationale for Evaluation and Treatment: Rehabilitation  ONSET DATE: 2020  SUBJECTIVE:  SUBJECTIVE STATEMENT: She has had PTNS . It helped in the beginning then did not. No vaginal moistruzer Fluid intake: water and herbal tea. NO caffeine.   FUNCTIONAL LIMITATIONS: urinary urgency  PERTINENT HISTORY:  Medications for current condition: none Surgeries: Appendectomy Other: Cancer; Osteoporosis Sexual abuse: No  PAIN:  Are you having pain? No   PRECAUTIONS: None  RED FLAGS: None   WEIGHT BEARING RESTRICTIONS: No  FALLS:  Has patient fallen in last 6 months? No  OCCUPATION: retired  ACTIVITY LEVEL : walk for exercise  PLOF: Independent  PATIENT GOALS: control the urgency   BOWEL MOVEMENT:no issues  URINATION: Pain with urination: No Fully empty bladder: Yes: sometimes due to a weak flow                                         Post-void dribble: No Stream:  Weak Urgency: Yes , once she has the urge to urinate it is very strong.  Frequency:during the day AM goes every hour; after lunch goes 2-3 hours                                                        Nocturia: No1 time   Leakage: none Pads/briefs: No  INTERCOURSE:not active    PREGNANCY: Vaginal deliveries 4 Tearing Yes: first child she tore Episiotomy No  PROLAPSE: None   OBJECTIVE:  Note: Objective measures were completed at Evaluation unless otherwise noted.  PATIENT SURVEYS:  PFIQ-7: 22 UIQ-7 24   COGNITION: Overall cognitive status: Within functional limits for tasks assessed     SENSATION: Light touch: Appears intact  FUNCTIONAL TESTS:  Single leg stance:  Mu:pwrmzjdzi trunk sway  Ou:pwrmzjdzi trunk sway   GAIT: Assistive device utilized: None  POSTURE: rounded shoulders, forward head, and increased thoracic kyphosis   LUMBARAROM/PROM:  A/PROM A/PROM  Eval (% available)  Flexion 100  Extension 50  Right lateral flexion 75  Left lateral flexion 75  Right rotation 75  Left rotation 75   (Blank rows = not tested)  LOWER EXTREMITY ROM: full bilateral hip ROM   LOWER EXTREMITY MMT:  MMT Right eval Left eval  Hip abduction 4/5 4/5   (Blank rows = not tested) PALPATION: Pelvic Alignment: ASIS equal  Abdominal:   Breathing: good movement of the rib cage and able to perform diaphragmatic breathing Scar tissue: No                 External Perineal Exam: vulvar area is pale and dry.                              Internal Pelvic Floor: tenderness located on the urethra, tightness along the introitus, along the levator ane  Patient confirms identification and approves PT to assess internal pelvic floor and treatment Yes All internal or external pelvic floor assessments and/or treatments are completed with proper hand hygiene and gloves hands. If needed gloves are changed with hand hygiene during patient care time. No emotional/communication  barriers or cognitive limitation. Patient is motivated to learn. Patient understands and agrees with treatment goals and plan. PT explains patient will be examined in standing, sitting, and lying down  to see how their muscles and joints work. When they are ready, they will be asked to remove their underwear so PT can examine their perineum. The patient is also given the option of providing their own chaperone as one is not provided in our facility. The patient also has the right and is explained the right to defer or refuse any part of the evaluation or treatment including the internal exam. With the patient's consent, PT will use one gloved finger to gently assess the muscles of the pelvic floor, seeing how well it contracts and relaxes and if there is muscle symmetry. After, the patient will get dressed and PT and patient will discuss exam findings and plan of care. PT and patient discuss plan of care, schedule, attendance policy and HEP activities.   PELVIC MMT:   MMT eval  Vaginal 2/5 with weak circular hug of finger  (Blank rows = not tested)        TONE: Average   PROLAPSE: none  TODAY'S TREATMENT:                                                                                                                              DATE: 09/04/24  EVAL Examination completed, findings reviewed, pt educated on POC, HEP, and female pelvic floor anatomy, reasoning with pelvic floor assessment internally with pt consent. Pt motivated to participate in PT and agreeable to attempt recommendations.     PATIENT EDUCATION:  Education details: educated patient on how to use coconut oil around the vulvar area and urethra to reduce the dryness Person educated: Patient Education method: Explanation, Demonstration, Tactile cues, Verbal cues, and Handouts Education comprehension: verbalized understanding, returned demonstration, verbal cues required, tactile cues required, and needs further education  HOME  EXERCISE PROGRAM: See above  ASSESSMENT:  CLINICAL IMPRESSION: Patient is a 88 y.o. female who was seen today for physical therapy evaluation and treatment for overactive bladder. Patient presents with urinary urgency. She has to rush to the bathroom when she has the urgency. Pelvic strength is 2/5. Patient has tenderness located on the urethra. She has tightness along the introitus, along the introitus and levator ani. Patient has to go to the bathroom every hour in the morning then afternoon is every 2-3 hours. Pelvic floor strength is 2/5 with weak circular hug of therapist finger. She has tightness around the levator ani, introitus, and perineal body. Patient would benefit from skilled therapy to reduce restrictions around the pelvic floor and coordination to reduce the urgency.   OBJECTIVE IMPAIRMENTS: decreased activity tolerance, decreased coordination, decreased endurance, and increased fascial restrictions.   ACTIVITY LIMITATIONS: continence  PARTICIPATION LIMITATIONS: community activity  PERSONAL FACTORS: 1-2 comorbidities: Appendectomy, osteoporosis are also affecting patient's functional outcome.   REHAB POTENTIAL: Excellent  CLINICAL DECISION MAKING: Evolving/moderate complexity  EVALUATION COMPLEXITY: Moderate   GOALS: Goals reviewed with patient? Yes  SHORT TERM GOALS: Target date: 10/02/24  Patient educated on vaginal moisturizers to improve  the health of the vulvar tissue.  Baseline: Goal status: INITIAL  2.  Patient educated on the urge to void to relax the pelvic floor.  Baseline:  Goal status: INITIAL  3.  Patient is able to perform diaphragmatic breathing and feel the pelvic floor relax.  Baseline:  Goal status: INITIAL   LONG TERM GOALS: Target date: 11/27/24  Patient independent with advanced HEP for flexibility, down training of the pelvic floor muscles, and coordination of the pelvic floor muscles.  Baseline:  Goal status: INITIAL  2.  Patient is  able to have the urge to urinate and wait 5 minutes prior to walking to the bathroom due to using urge suppression.  Baseline:  Goal status: INITIAL  3.  Patient reports her urinary urgency has decreased >/=70% due to elongation of the tissue and using vaginal moisturizers.  Baseline:  Goal status: INITIAL  4.  PFIQ-7 decreased </= 10 compared to  22 due to the reduction of urgency symptoms.  Baseline:  Goal status: INITIAL   PLAN:  PT FREQUENCY: 1-2x/week  PT DURATION: 12 weeks  PLANNED INTERVENTIONS: 97110-Therapeutic exercises, 97530- Therapeutic activity, [id number]- Neuromuscular re-education, 97535- Self Care, [id number]- Manual therapy, [id number]- Electrical stimulation (unattended), [phone] (1-2 muscles), [id number] (3+ muscles)- Dry Needling, Patient/Family education, Cryotherapy, Moist heat, and Biofeedback  PLAN FOR NEXT SESSION: manual work to the pelvic floor introitus, levator ani and urethra; manual work to the urachus ligament; diaphragmatic breathing.    Channing Pereyra, PT 09/04/24 3:45 PM

## 2024-09-04 ENCOUNTER — Other Ambulatory Visit: Payer: Self-pay

## 2024-09-04 ENCOUNTER — Encounter: Payer: Self-pay | Admitting: Physical Therapy

## 2024-09-04 ENCOUNTER — Ambulatory Visit: Attending: Obstetrics and Gynecology | Admitting: Physical Therapy

## 2024-09-04 DIAGNOSIS — R279 Unspecified lack of coordination: Secondary | ICD-10-CM | POA: Insufficient documentation

## 2024-09-04 DIAGNOSIS — M6281 Muscle weakness (generalized): Secondary | ICD-10-CM | POA: Diagnosis present

## 2024-09-11 ENCOUNTER — Ambulatory Visit: Admitting: Physical Therapy

## 2024-09-12 ENCOUNTER — Encounter: Attending: Obstetrics and Gynecology | Admitting: Physical Therapy

## 2024-09-12 ENCOUNTER — Encounter: Payer: Self-pay | Admitting: Physical Therapy

## 2024-09-12 DIAGNOSIS — R279 Unspecified lack of coordination: Secondary | ICD-10-CM | POA: Insufficient documentation

## 2024-09-12 DIAGNOSIS — M6281 Muscle weakness (generalized): Secondary | ICD-10-CM | POA: Diagnosis present

## 2024-09-12 NOTE — Therapy (Signed)
 OUTPATIENT PHYSICAL THERAPY FEMALE PELVIC TREATMENT   Patient Name: Terry Edwards MRN: [id number] DOB:[birth year], 88 y.o., female Today's Date: 09/12/2024  END OF SESSION:  PT End of Session - 09/12/24 1139     Visit Number 2    Authorization Type [employer]    Authorization - Visit Number 2    Authorization - Number of Visits 10    PT Start Time 1130    PT Stop Time 1215    PT Time Calculation (min) 45 min    Activity Tolerance Patient tolerated treatment well    Behavior During Therapy WFL for tasks assessed/performed          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   Cataract    removed bilateral eyes   Chronic hepatitis B without hepatic coma (HCC) 1970s   chronic elev AFP   Cyst of pineal gland    incidental on MRI   Heart murmur    Hemorrhoids    History of bone density study 2022   History of chicken pox    History of depression 2006   situational, on Lexapro for 1 year   History of mammogram 2024   History of MRI 2024   Right shoulder   Hyperlipidemia    Osteopenia    hx fx 5th MT R foot 03/2013   Osteoporosis    Plantar fasciitis of left foot    Per [facility] new patient packet   Right rotator cuff tendonitis 2013   resolved with PT (injections not helpful)   Urine incontinence    Vitamin D  deficiency    Past Surgical History:  Procedure Laterality Date   APPENDECTOMY  1957   BLEPHAROPLASTY     Per [facility] new patient packet   COLONOSCOPY     EYE SURGERY Bilateral 03/09/15, 03/23/15   cataract removal   TONSILLECTOMY  1940's   Patient Active Problem List   Diagnosis Date Noted   Hardening of the aorta (main artery of the heart) 06/14/2023   Thoracic aortic aneurysm 07/27/2022   Elevated blood pressure reading without diagnosis of hypertension 11/13/2021   Right hand pain 11/13/2021   Impingement syndrome of shoulder region  10/25/2021   AF (amaurosis fugax) 07/09/2021   Drug-induced skin rash 04/23/2021   Cyst of joint of hand, right 01/26/2021   Thinning hair 10/12/2020   Murmur, cardiac 10/12/2020   Traumatic incomplete tear of right rotator cuff 10/12/2020   Urge incontinence 10/12/2020   Routine general medical examination at a health care facility 01/09/2015   Chronic toe pain, right foot 09/03/2014   Cyst of pineal gland    Vitamin D  deficiency    Osteoporosis    Hx of type B viral hepatitis    Hyperlipidemia     PCP: None  REFERRING PROVIDER: Zuleta, Kaitlin G, NP   REFERRING DIAG:  N32.81 (ICD-10-CM) - Overactive bladder  R39.15 (ICD-10-CM) - Urinary urgency  R35.0 (ICD-10-CM) - Urinary frequency  N39.41 (ICD-10-CM) - Urge incontinence    THERAPY DIAG:  Muscle weakness (generalized)  Unspecified lack of coordination  Rationale for Evaluation and Treatment: Rehabilitation  ONSET DATE: 2020  SUBJECTIVE:  SUBJECTIVE STATEMENT: I feel good after the initial eval.   Fluid intake: water and herbal tea. NO caffeine.   FUNCTIONAL LIMITATIONS: urinary urgency  PERTINENT HISTORY:  Medications for current condition: none Surgeries: Appendectomy Other: Cancer; Osteoporosis Sexual abuse: No  PAIN:  Are you having pain? No   PRECAUTIONS: None  RED FLAGS: None   WEIGHT BEARING RESTRICTIONS: No  FALLS:  Has patient fallen in last 6 months? No  OCCUPATION: retired  ACTIVITY LEVEL : walk for exercise  PLOF: Independent  PATIENT GOALS: control the urgency   BOWEL MOVEMENT:no issues  URINATION: Pain with urination: No Fully empty bladder: Yes: sometimes due to a weak flow                                         Post-void dribble: No Stream: Weak Urgency: Yes , once she has the urge to  urinate it is very strong.  Frequency:during the day AM goes every hour; after lunch goes 2-3 hours                                                        Nocturia: No1 time   Leakage: none Pads/briefs: No  INTERCOURSE:not active    PREGNANCY: Vaginal deliveries 4 Episiotomy  yes with first child  PROLAPSE: None   OBJECTIVE:  Note: Objective measures were completed at Evaluation unless otherwise noted.  PATIENT SURVEYS:  PFIQ-7: 22 UIQ-7 24   COGNITION: Overall cognitive status: Within functional limits for tasks assessed     SENSATION: Light touch: Appears intact  FUNCTIONAL TESTS:  Single leg stance:  Mu:pwrmzjdzi trunk sway  Ou:pwrmzjdzi trunk sway   GAIT: Assistive device utilized: None  POSTURE: rounded shoulders, forward head, and increased thoracic kyphosis   LUMBARAROM/PROM:  A/PROM A/PROM  Eval (% available)  Flexion 100  Extension 50  Right lateral flexion 75  Left lateral flexion 75  Right rotation 75  Left rotation 75   (Blank rows = not tested)  LOWER EXTREMITY ROM: full bilateral hip ROM   LOWER EXTREMITY MMT:  MMT Right eval Left eval  Hip abduction 4/5 4/5   (Blank rows = not tested) PALPATION: Pelvic Alignment: ASIS equal  Abdominal:   Breathing: good movement of the rib cage and able to perform diaphragmatic breathing Scar tissue: No                 External Perineal Exam: vulvar area is pale and dry.                              Internal Pelvic Floor: tenderness located on the urethra, tightness along the introitus, along the levator ane  Patient confirms identification and approves PT to assess internal pelvic floor and treatment Yes All internal or external pelvic floor assessments and/or treatments are completed with proper hand hygiene and gloves hands. If needed gloves are changed with hand hygiene during patient care time. No emotional/communication barriers or cognitive limitation. Patient is motivated to learn.  Patient understands and agrees with treatment goals and plan. PT explains patient will be examined in standing, sitting, and lying down to see how their muscles and joints work. When  they are ready, they will be asked to remove their underwear so PT can examine their perineum. The patient is also given the option of providing their own chaperone as one is not provided in our facility. The patient also has the right and is explained the right to defer or refuse any part of the evaluation or treatment including the internal exam. With the patient's consent, PT will use one gloved finger to gently assess the muscles of the pelvic floor, seeing how well it contracts and relaxes and if there is muscle symmetry. After, the patient will get dressed and PT and patient will discuss exam findings and plan of care. PT and patient discuss plan of care, schedule, attendance policy and HEP activities.   PELVIC MMT:   MMT eval  Vaginal 2/5 with weak circular hug of finger  (Blank rows = not tested)        TONE: Average   PROLAPSE: none  TODAY'S TREATMENT:    09/12/24 Manual: Myofascial release: Fascial release around the clitoris, along the urogenital diaphragm, between the labia and along the perineal body going through the restrictions. Educated patient on how to perform at home.  Internal pelvic floor techniques: No emotional/communication barriers or cognitive limitation. Patient is motivated to learn. Patient understands and agrees with treatment goals and plan. PT explains patient will be examined in standing, sitting, and lying down to see how their muscles and joints work. When they are ready, they will be asked to remove their underwear so PT can examine their perineum. The patient is also given the option of providing their own chaperone as one is not provided in our facility. The patient also has the right and is explained the right to defer or refuse any part of the evaluation or treatment  including the internal exam. With the patient's consent, PT will use one gloved finger to gently assess the muscles of the pelvic floor, seeing how well it contracts and relaxes and if there is muscle symmetry. After, the patient will get dressed and PT and patient will discuss exam findings and plan of care. PT and patient discuss plan of care, schedule, attendance policy and HEP activities.  Therapist gloved finger in the vaginal canal working on the posterior vaginal canal, along the levator ani, sides of the bladder, and along the urethra sphincter to release the restrictions Self-care: Discussed with patient on using the coconut oil for vaginal moisturizers Educated patient on how to mobilize her clitoris, manual work to the perineal body and between the labia's for tissue mobility                                                                                                                              DATE: 09/04/24  EVAL Examination completed, findings reviewed, pt educated on POC, HEP, and female pelvic floor anatomy, reasoning with pelvic floor assessment internally with pt consent. Pt motivated to participate in PT and agreeable to attempt  recommendations.     PATIENT EDUCATION:  Education details: educated patient on how to use coconut oil around the vulvar area and urethra to reduce the dryness Person educated: Patient Education method: Explanation, Demonstration, Tactile cues, Verbal cues, and Handouts Education comprehension: verbalized understanding, returned demonstration, verbal cues required, tactile cues required, and needs further education  HOME EXERCISE PROGRAM: See above  ASSESSMENT:  CLINICAL IMPRESSION: Patient is a 88 y.o. female who was seen today for physical therapy  treatment for overactive bladder. Patient is using the coconut oil for vaginal dryness.  Paitent had increased tissue mobility after the manual work and understands how to perform her own work. The  tissue is less dry due to using the cocnut oil. Patient would benefit from skilled therapy to reduce restrictions around the pelvic floor and coordination to reduce the urgency.   OBJECTIVE IMPAIRMENTS: decreased activity tolerance, decreased coordination, decreased endurance, and increased fascial restrictions.   ACTIVITY LIMITATIONS: continence  PARTICIPATION LIMITATIONS: community activity  PERSONAL FACTORS: 1-2 comorbidities: Appendectomy, osteoporosis are also affecting patient's functional outcome.   REHAB POTENTIAL: Excellent  CLINICAL DECISION MAKING: Evolving/moderate complexity  EVALUATION COMPLEXITY: Moderate   GOALS: Goals reviewed with patient? Yes  SHORT TERM GOALS: Target date: 10/02/24  Patient educated on vaginal moisturizers to improve the health of the vulvar tissue.  Baseline: Goal status: Met 09/12/24  2.  Patient educated on the urge to void to relax the pelvic floor.  Baseline:  Goal status: INITIAL  3.  Patient is able to perform diaphragmatic breathing and feel the pelvic floor relax.  Baseline:  Goal status: INITIAL   LONG TERM GOALS: Target date: 11/27/24  Patient independent with advanced HEP for flexibility, down training of the pelvic floor muscles, and coordination of the pelvic floor muscles.  Baseline:  Goal status: INITIAL  2.  Patient is able to have the urge to urinate and wait 5 minutes prior to walking to the bathroom due to using urge suppression.  Baseline:  Goal status: INITIAL  3.  Patient reports her urinary urgency has decreased >/=70% due to elongation of the tissue and using vaginal moisturizers.  Baseline:  Goal status: INITIAL  4.  PFIQ-7 decreased </= 10 compared to  22 due to the reduction of urgency symptoms.  Baseline:  Goal status: INITIAL   PLAN:  PT FREQUENCY: 1-2x/week  PT DURATION: 12 weeks  PLANNED INTERVENTIONS: 97110-Therapeutic exercises, 97530- Therapeutic activity, [id number]- Neuromuscular  re-education, 97535- Self Care, [id number]- Manual therapy, [id number]- Electrical stimulation (unattended), [phone] (1-2 muscles), [id number] (3+ muscles)- Dry Needling, Patient/Family education, Cryotherapy, Moist heat, and Biofeedback  PLAN FOR NEXT SESSION: manual work to the pelvic floor introitus, levator ani and urethra; manual work to the urachus ligament; diaphragmatic breathing. Urge to void   Channing Pereyra, PT 09/12/2024 12:52 PM

## 2024-09-16 ENCOUNTER — Ambulatory Visit: Admitting: Diagnostic Neuroimaging

## 2024-09-16 ENCOUNTER — Encounter: Payer: Self-pay | Admitting: Diagnostic Neuroimaging

## 2024-09-16 VITALS — BP 154/80 | HR 91 | Ht 61.5 in | Wt 116.6 lb

## 2024-09-16 DIAGNOSIS — G629 Polyneuropathy, unspecified: Secondary | ICD-10-CM

## 2024-09-16 DIAGNOSIS — G252 Other specified forms of tremor: Secondary | ICD-10-CM | POA: Diagnosis not present

## 2024-09-16 NOTE — Patient Instructions (Addendum)
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

## 2024-09-16 NOTE — Progress Notes (Signed)
 [facility]  PATIENT: Terry Edwards DOB: [birth year]  REFERRING CLINICIAN: Sherlynn Madden, MD HISTORY FROM: patient  REASON FOR VISIT: new consult   HISTORICAL  CHIEF COMPLAINT:  Chief Complaint  Patient presents with   RM 7     Patient is here alone for Neuropathy and tremors - had a fall in 2022 and has been having tremors more often since. Noticed it is triggered by anxiety. Neuropathy has it for years and has one toe goes completely numb at the tip (right foot)     HISTORY OF PRESENT ILLNESS:   88 year old female here for evaluation of numbness and tingling in feet.  Symptoms started about 1 year ago with mild tingling sensation in bilateral feet.  Also has noted some numbness sensation of the right foot fourth toe.  Also has some thickening of the nail around that fourth toe as well.  Denies any pain weakness or limitations in gait or walking.  No problems approximately the legs.  No problems with arms or neck.  Has noted some mild postural tremor especially in the setting of anxiety.   REVIEW OF SYSTEMS: Full 14 system review of systems performed and negative with exception of: as per HPI.  ALLERGIES: Allergies[1]  HOME MEDICATIONS: Outpatient Medications Prior to Visit  Medication Sig Dispense Refill   Ascorbic Acid (VITAMIN C) 1000 MG tablet Take 1,000 mg by mouth daily.     MAGNESIUM GLYCINATE PO Take 400 mg by mouth 3 (three) times daily.     OVER THE COUNTER MEDICATION Take 2 tablets by mouth 2 (two) times daily. Standard Process (BIOST) Skeletal & Cellular Health     OVER THE COUNTER MEDICATION Take 1 tablet by mouth in the morning and at bedtime. Standard Process Calcium Metabolism Health (Cal-Ma Plus)     OVER THE COUNTER MEDICATION Take 2 tablets by mouth 2 (two) times daily. Standard Process Inflammatory Response Support (Cataplex-F)     OVER THE COUNTER MEDICATION Take 1 tablet by mouth daily. Standard Process Supports energy cell  production (Organically bound minerals)     OVER THE COUNTER MEDICATION Take 2 tablets by mouth 2 (two) times daily. Standard Process For strong, Healthy bones ( Calcifood)     OVER THE COUNTER MEDICATION Take 2 tablets by mouth daily. Standard Process Detoxification (Chlorophyll Complex)     OVER THE COUNTER MEDICATION Take 2 tablets by mouth in the morning and at bedtime. With a meal [ Enzyme Digestive Support ] ( Zypan )     OVER THE COUNTER MEDICATION Take 2 Pieces by mouth daily. Inflammatory response support ( Cod Liver Oil ) Softgels     OVER THE COUNTER MEDICATION Take 3 mg by mouth. Cyruta     triamcinolone  cream (KENALOG ) 0.1 % Apply 1 Application topically as needed.     VITAMIN D  PO Take 5,000 Units by mouth daily. Softgels     alendronate  (FOSAMAX ) 70 MG tablet Take 1 tablet (70 mg total) by mouth every 7 (seven) days. Take with a full glass of water on an empty stomach. (Patient not taking: Reported on 09/16/2024) 4 tablet 11   pantoprazole  (PROTONIX ) 20 MG tablet Take 1 tablet (20 mg total) by mouth daily. (Patient not taking: Reported on 09/16/2024) 30 tablet 0   Vibegron  75 MG TABS Take 1 tablet (75 mg total) by mouth daily. (Patient not taking: Reported on 09/16/2024) 30 tablet 5   No facility-administered medications prior to visit.    PAST MEDICAL HISTORY: Past  Medical History:  Diagnosis Date   Allergy    Arthritis    Per [facility] new patient    Cancer (HCC)    squamous cell carcinoma removed from Left leg 2022   Cataract    removed bilateral eyes   Chronic hepatitis B without hepatic coma (HCC) 1970s   chronic elev AFP   Cyst of pineal gland    incidental on MRI   Heart murmur    Hemorrhoids    History of bone density study 2022   History of chicken pox    History of depression 2006   situational, on Lexapro for 1 year   History of mammogram 2024   History of MRI 2024   Right shoulder   Hyperlipidemia    Osteopenia    hx fx 5th MT R foot 03/2013   Osteoporosis     Plantar fasciitis of left foot    Per [facility] new patient packet   Right rotator cuff tendonitis 2013   resolved with PT (injections not helpful)   Urine incontinence    Vitamin D  deficiency     PAST SURGICAL HISTORY: Past Surgical History:  Procedure Laterality Date   APPENDECTOMY  1957   BLEPHAROPLASTY     Per [facility] new patient packet   COLONOSCOPY     EYE SURGERY Bilateral 03/09/15, 03/23/15   cataract removal   TONSILLECTOMY  1940's    FAMILY HISTORY: Family History  Problem Relation Age of Onset   Hypertension Mother    Hyperlipidemia Mother    Myelodysplastic syndrome Mother    Pneumonia Father    Hypertension Father    Hyperlipidemia Father    Diabetes Father    Clotting disorder Father    Suicidality Brother    Breast cancer Maternal Grandmother    Seizures Son    Pneumonia Son    Parkinson's disease Son    Colon cancer Neg Hx    Esophageal cancer Neg Hx    Rectal cancer Neg Hx    Stomach cancer Neg Hx    Bladder Cancer Neg Hx    Uterine cancer Neg Hx    Renal cancer Neg Hx    Stroke Neg Hx    Migraines Neg Hx     SOCIAL HISTORY: Social History   Socioeconomic History   Marital status: Widowed    Spouse name: Not on file   Number of children: Not on file   Years of education: Not on file   Highest education level: Bachelor's degree (e.g., BA, AB, BS)  Occupational History   Not on file  Tobacco Use   Smoking status: Never   Smokeless tobacco: Never   Tobacco comments:    widowed, lives alone. Former designer, industrial/product. moved to [place] from [place]  09/2013  Vaping Use   Vaping status: Never Used  Substance and Sexual Activity   Alcohol use: Not Currently    Comment: occasion 1 glass of wine   Drug use: No   Sexual activity: Not Currently  Other Topics Concern   Not on file  Social History Narrative   Diet Regular      Do you drink/eat things with caffeine  limited      Marital Status  Widowed What year were you married? 1957      Do you live in a  house, apartment, assisted living, condo, trailer, etc.? House (townhouse)      Is it one or more stories? One      How many persons live  in your home? 1         Do you have any pets in your home?(please list) None      Highest level of education completed: BS      Current or past profession: Engineer, Civil (consulting)      Do you exercise?: Yes    Type and how often: 2 times a week at gym walk 2-3 times a week      Do you have a Living Will? (Form that indicates scenarios where you would not want your life prolonged) Yes      Do you have a DNR form?         If not, would you like to discuss one? Yes      Do you have signed POA/HPOA forms? Yes      Do you have difficulty bathing or dressing yourself? No      Do you have difficulty preparing food or eating? No      Do you have difficulty managing medications? No      Do you have difficulty managing your finances? No      Do you have difficulty affording your medications? No      No caffeine - due to bladder issues             Social Drivers of Health   Tobacco Use: Low Risk (09/16/2024)   Patient History    Smoking Tobacco Use: Never    Smokeless Tobacco Use: Never    Passive Exposure: Not on file  Financial Resource Strain: Low Risk (04/25/2024)   Overall Financial Resource Strain (CARDIA)    Difficulty of Paying Living Expenses: Not hard at all  Food Insecurity: No Food Insecurity (04/25/2024)   Epic    Worried About Radiation Protection Practitioner of Food in the Last Year: Never true    Ran Out of Food in the Last Year: Never true  Transportation Needs: No Transportation Needs (04/25/2024)   Epic    Lack of Transportation (Medical): No    Lack of Transportation (Non-Medical): No  Physical Activity: Sufficiently Active (04/25/2024)   Exercise Vital Sign    Days of Exercise per Week: 4 days    Minutes of Exercise per Session: 60 min  Stress: No Stress Concern Present (04/25/2024)   [employer] of Occupational Health - Occupational  Stress Questionnaire    Feeling of Stress: Only a little  Social Connections: Moderately Integrated (04/25/2024)   Social Connection and Isolation Panel    Frequency of Communication with Friends and Family: More than three times a week    Frequency of Social Gatherings with Friends and Family: More than three times a week    Attends Religious Services: More than 4 times per year    Active Member of [employer] or Organizations: Yes    Attends Banker Meetings: More than 4 times per year    Marital Status: Widowed  Intimate Partner Violence: Not on file  Depression [phone]): Low Risk (04/30/2024)   Depression (PHQ2-9)    PHQ-2 Score: 0  Alcohol Screen: Low Risk (04/25/2024)   Alcohol Screen    Last Alcohol Screening Score (AUDIT): 1  Housing: Low Risk (04/25/2024)   Epic    Unable to Pay for Housing in the Last Year: No    Number of Times Moved in the Last Year: 0    Homeless in the Last Year: No  Utilities: Not on file  Health Literacy: Not on file  PHYSICAL EXAM  GENERAL EXAM/CONSTITUTIONAL: Vitals:  Vitals:   09/16/24 1033  BP: (!) 154/80  Pulse: 91  SpO2: 98%  Weight: 116 lb 9.6 oz (52.9 kg)  Height: 5' 1.5 (1.562 m)   Body mass index is 21.67 kg/m. Wt Readings from Last 3 Encounters:  09/16/24 116 lb 9.6 oz (52.9 kg)  04/30/24 121 lb 3.2 oz (55 kg)  03/07/24 126 lb (57.2 kg)   Patient is in no distress; well developed, nourished and groomed; neck is supple  CARDIOVASCULAR: Examination of carotid arteries is normal; no carotid bruits Regular rate and rhythm, no murmurs Examination of peripheral vascular system by observation and palpation is normal  EYES: Ophthalmoscopic exam of optic discs and posterior segments is normal; no papilledema or hemorrhages No results found.  MUSCULOSKELETAL: Gait, strength, tone, movements noted in Neurologic exam below  NEUROLOGIC: MENTAL STATUS:      No data to display         awake, alert, oriented to  person, place and time recent and remote memory intact normal attention and concentration language fluent, comprehension intact, naming intact fund of knowledge appropriate  CRANIAL NERVE:  2nd - no papilledema on fundoscopic exam 2nd, 3rd, 4th, 6th - pupils equal and reactive to light, visual fields full to confrontation, extraocular muscles intact, no nystagmus 5th - facial sensation symmetric 7th - facial strength symmetric 8th - hearing intact 9th - palate elevates symmetrically, uvula midline 11th - shoulder shrug symmetric 12th - tongue protrusion midline  MOTOR:  normal bulk and tone, full strength in the BUE, BLE MILD POSTURAL TREMOR IN BUE  SENSORY:  normal and symmetric to light touch, pinprick, temperature, vibration; DECR IN FEET / TOES HIGH ARCHES; BORDERLINE HAMMER TOES  COORDINATION:  finger-nose-finger, fine finger movements normal  REFLEXES:  deep tendon reflexes 1+ and symmetric  GAIT/STATION:  narrow based gait    DIAGNOSTIC DATA (LABS, IMAGING, TESTING) - I reviewed patient records, labs, notes, testing and imaging myself where available.  Lab Results  Component Value Date   WBC 5.0 08/15/2024   HGB 12.5 08/15/2024   HCT 37.8 08/15/2024   MCV 91.1 08/15/2024   PLT 253 08/15/2024      Component Value Date/Time   NA 142 08/15/2024 1405   K 4.1 08/15/2024 1405   CL 106 08/15/2024 1405   CO2 26 08/15/2024 1405   GLUCOSE 129 (H) 08/15/2024 1405   BUN 16 08/15/2024 1405   CREATININE 0.63 08/15/2024 1405   CREATININE 0.67 11/03/2023 0848   CALCIUM 9.5 08/15/2024 1405   PROT 6.7 08/15/2024 1405   ALBUMIN 3.8 08/15/2024 1405   AST 34 08/15/2024 1405   ALT 48 (H) 08/15/2024 1405   ALKPHOS 102 08/15/2024 1405   BILITOT 0.4 08/15/2024 1405   GFRNONAA >60 08/15/2024 1405   GFRNONAA 81 10/12/2020 1451   GFRAA 94 10/12/2020 1451   Lab Results  Component Value Date   CHOL 223 (H) 04/22/2024   HDL 82 04/22/2024   LDLCALC 125 (H) 04/22/2024    TRIG 69 04/22/2024   CHOLHDL 2.7 04/22/2024   Lab Results  Component Value Date   HGBA1C 5.4 11/03/2023   Lab Results  Component Value Date   VITAMINB12 425 03/31/2020   Lab Results  Component Value Date   TSH 2.49 11/03/2023    06/27/17 RIGHT FOOT XRAY  Moderately severe osteoarthritic change in the first MTP joint. Milder narrowing of all PIP and DIP joints. Small posterior calcaneal spur. No acute fracture or dislocation.  ASSESSMENT AND PLAN  88 y.o. year old female here with:   Dx:  1. Neuropathy   2. 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    EDUARD FABIENE HANLON, MD 09/16/2024, 11:13 AM Certified in Neurology, Neurophysiology and Neuroimaging  [facility] [address] [place], [place] [address] [phone]     [1]  Allergies Allergen Reactions   Donepezil  Rash and Other (See Comments)   Alendronate  Rash and Other (See Comments)

## 2024-09-18 ENCOUNTER — Ambulatory Visit: Payer: Self-pay | Attending: Obstetrics and Gynecology | Admitting: Physical Therapy

## 2024-09-18 ENCOUNTER — Encounter: Payer: Self-pay | Admitting: Physical Therapy

## 2024-09-18 DIAGNOSIS — R279 Unspecified lack of coordination: Secondary | ICD-10-CM | POA: Insufficient documentation

## 2024-09-18 DIAGNOSIS — R293 Abnormal posture: Secondary | ICD-10-CM | POA: Diagnosis present

## 2024-09-18 DIAGNOSIS — M545 Low back pain, unspecified: Secondary | ICD-10-CM | POA: Diagnosis present

## 2024-09-18 DIAGNOSIS — M6281 Muscle weakness (generalized): Secondary | ICD-10-CM | POA: Insufficient documentation

## 2024-09-18 NOTE — Patient Instructions (Signed)
 Urge Incontinence  Ideal urination frequency is every 2-4 wakeful hours, which equates to 5-8 times within a 24-hour period.   Urge incontinence is leakage that occurs when the bladder muscle contracts, creating a sudden need to go before getting to the bathroom.   Going too often when your bladder isn't actually full can disrupt the body's automatic signals to store and hold urine longer, which will increase urgency/frequency.  In this case, the bladder is running the show and strategies can be learned to retrain this pattern.   One should be able to control the first urge to urinate, at around .  The bladder can hold up to a grande latte, or . To help you gain control, practice the Urge Drill below when urgency strikes.  This drill will help retrain your bladder signals and allow you to store and hold urine longer.  The overall goal is to stretch out your time between voids to reach a more manageable voiding schedule.    Practice your quick flicks often throughout the day (each waking hour) even when you don't need feel the urge to go.  This will help strengthen your pelvic floor muscles, making them more effective in controlling leakage.  Urge Drill  When you feel an urge to go, follow these steps to regain control: Stop what you are doing and be still Take one deep breath, directing your air into your abdomen Think an affirming thought, such as I've got this. Do 5 quick flicks of your pelvic floor Raise heels up and have them slam on the floor 5 times Walk with control to the bathroom to void, or delay voiding If you get the urge again then stop and repeat as above.   [facility] [address], [address] [place], [place] [address] Phone # [phone] Fax [phone]

## 2024-09-18 NOTE — Therapy (Signed)
 OUTPATIENT PHYSICAL THERAPY FEMALE PELVIC TREATMENT   Patient Name: Terry Edwards MRN: [id number] DOB:[birth year], 88 y.o., female Today's Date: 09/18/2024  END OF SESSION:  PT End of Session - 09/18/24 1449     Visit Number 3    Date for Recertification  11/27/24    Authorization Type [employer]    Authorization - Visit Number 3    Authorization - Number of Visits 10    PT Start Time 1445    PT Stop Time 1525    PT Time Calculation (min) 40 min    Activity Tolerance Patient tolerated treatment well    Behavior During Therapy [facility] for tasks assessed/performed          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   Cataract    removed bilateral eyes   Chronic hepatitis B without hepatic coma (HCC) 1970s   chronic elev AFP   Cyst of pineal gland    incidental on MRI   Heart murmur    Hemorrhoids    History of bone density study 2022   History of chicken pox    History of depression 2006   situational, on Lexapro for 1 year   History of mammogram 2024   History of MRI 2024   Right shoulder   Hyperlipidemia    Osteopenia    hx fx 5th MT R foot 03/2013   Osteoporosis    Plantar fasciitis of left foot    Per [facility] new patient packet   Right rotator cuff tendonitis 2013   resolved with PT (injections not helpful)   Urine incontinence    Vitamin D  deficiency    Past Surgical History:  Procedure Laterality Date   APPENDECTOMY  1957   BLEPHAROPLASTY     Per [facility] new patient packet   COLONOSCOPY     EYE SURGERY Bilateral 03/09/15, 03/23/15   cataract removal   TONSILLECTOMY  1940's   Patient Active Problem List   Diagnosis Date Noted   Hardening of the aorta (main artery of the heart) 06/14/2023   Thoracic aortic aneurysm 07/27/2022   Elevated blood pressure reading without diagnosis of hypertension 11/13/2021   Right hand pain 11/13/2021    Impingement syndrome of shoulder region 10/25/2021   AF (amaurosis fugax) 07/09/2021   Drug-induced skin rash 04/23/2021   Cyst of joint of hand, right 01/26/2021   Thinning hair 10/12/2020   Murmur, cardiac 10/12/2020   Traumatic incomplete tear of right rotator cuff 10/12/2020   Urge incontinence 10/12/2020   Routine general medical examination at a health care facility 01/09/2015   Chronic toe pain, right foot 09/03/2014   Cyst of pineal gland    Vitamin D  deficiency    Osteoporosis    Hx of type B viral hepatitis    Hyperlipidemia     PCP: None  REFERRING PROVIDER: Zuleta, Kaitlin G, NP   REFERRING DIAG:  N32.81 (ICD-10-CM) - Overactive bladder  R39.15 (ICD-10-CM) - Urinary urgency  R35.0 (ICD-10-CM) - Urinary frequency  N39.41 (ICD-10-CM) - Urge incontinence    THERAPY DIAG:  Muscle weakness (generalized)  Unspecified lack of coordination  Acute right-sided low back pain without sciatica  Abnormal posture  Rationale for Evaluation and Treatment: Rehabilitation  ONSET DATE: 2020  SUBJECTIVE:  SUBJECTIVE STATEMENT: I was a little crampy after last visit. I feel the urine flow is more forceful. The coconut oil is helping. I have been trying to do the manual work on the pelvic floor. No urinary leakage since last visit.     Fluid intake: water and herbal tea. NO caffeine.   FUNCTIONAL LIMITATIONS: urinary urgency  PERTINENT HISTORY:  Medications for current condition: none Surgeries: Appendectomy Other: Cancer; Osteoporosis Sexual abuse: No  PAIN:  Are you having pain? No   PRECAUTIONS: None  RED FLAGS: None   WEIGHT BEARING RESTRICTIONS: No  FALLS:  Has patient fallen in last 6 months? No  OCCUPATION: retired  ACTIVITY LEVEL : walk for exercise  PLOF:  Independent  PATIENT GOALS: control the urgency   BOWEL MOVEMENT:no issues  URINATION: Pain with urination: No Fully empty bladder: Yes: sometimes due to a weak flow                                         Post-void dribble: No Stream: Weak Urgency: Yes , once she has the urge to urinate it is very strong.  Frequency:during the day AM goes every hour; after lunch goes 2-3 hours                                                        Nocturia: No1 time   Leakage: none Pads/briefs: No  INTERCOURSE:not active    PREGNANCY: Vaginal deliveries 4 Episiotomy  yes with first child  PROLAPSE: None   OBJECTIVE:  Note: Objective measures were completed at Evaluation unless otherwise noted.  PATIENT SURVEYS:  PFIQ-7: 22 UIQ-7 24   COGNITION: Overall cognitive status: Within functional limits for tasks assessed     SENSATION: Light touch: Appears intact  FUNCTIONAL TESTS:  Single leg stance:  Mu:pwrmzjdzi trunk sway  Ou:pwrmzjdzi trunk sway   GAIT: Assistive device utilized: None  POSTURE: rounded shoulders, forward head, and increased thoracic kyphosis   LUMBARAROM/PROM:  A/PROM A/PROM  Eval (% available)  Flexion 100  Extension 50  Right lateral flexion 75  Left lateral flexion 75  Right rotation 75  Left rotation 75   (Blank rows = not tested)  LOWER EXTREMITY ROM: full bilateral hip ROM   LOWER EXTREMITY MMT:  MMT Right eval Left eval  Hip abduction 4/5 4/5   (Blank rows = not tested) PALPATION: Pelvic Alignment: ASIS equal  Abdominal:   Breathing: good movement of the rib cage and able to perform diaphragmatic breathing Scar tissue: No                 External Perineal Exam: vulvar area is pale and dry.                              Internal Pelvic Floor: tenderness located on the urethra, tightness along the introitus, along the levator ane  Patient confirms identification and approves PT to assess internal pelvic floor and treatment  Yes All internal or external pelvic floor assessments and/or treatments are completed with proper hand hygiene and gloves hands. If needed gloves are changed with hand hygiene during patient care time. No emotional/communication barriers or cognitive limitation.  Patient is motivated to learn. Patient understands and agrees with treatment goals and plan. PT explains patient will be examined in standing, sitting, and lying down to see how their muscles and joints work. When they are ready, they will be asked to remove their underwear so PT can examine their perineum. The patient is also given the option of providing their own chaperone as one is not provided in our facility. The patient also has the right and is explained the right to defer or refuse any part of the evaluation or treatment including the internal exam. With the patient's consent, PT will use one gloved finger to gently assess the muscles of the pelvic floor, seeing how well it contracts and relaxes and if there is muscle symmetry. After, the patient will get dressed and PT and patient will discuss exam findings and plan of care. PT and patient discuss plan of care, schedule, attendance policy and HEP activities.   PELVIC MMT:   MMT eval  Vaginal 2/5 with weak circular hug of finger  (Blank rows = not tested)        TONE: Average   PROLAPSE: none  TODAY'S TREATMENT:    09/18/24 Manual: Myofascial release: Fascial release around the urachus ligament, area where the ureter is and kidneys  Neuromuscular re-education: Down training: Diaphragmatic breathing to relax the pelvic floor in supine, sitting and standing Educated patient on the urge to void for suppression of urinary urgency and patient returned demonstration Self-care: Reviewed vaginal moisturizer and using the coconut oil.  Reviewed on how to mobilize the clitoris and perineal tissue to reduce urgency    09/12/24 Manual: Myofascial release: Fascial release around  the clitoris, along the urogenital diaphragm, between the labia and along the perineal body going through the restrictions. Educated patient on how to perform at home.  Internal pelvic floor techniques: No emotional/communication barriers or cognitive limitation. Patient is motivated to learn. Patient understands and agrees with treatment goals and plan. PT explains patient will be examined in standing, sitting, and lying down to see how their muscles and joints work. When they are ready, they will be asked to remove their underwear so PT can examine their perineum. The patient is also given the option of providing their own chaperone as one is not provided in our facility. The patient also has the right and is explained the right to defer or refuse any part of the evaluation or treatment including the internal exam. With the patient's consent, PT will use one gloved finger to gently assess the muscles of the pelvic floor, seeing how well it contracts and relaxes and if there is muscle symmetry. After, the patient will get dressed and PT and patient will discuss exam findings and plan of care. PT and patient discuss plan of care, schedule, attendance policy and HEP activities.  Therapist gloved finger in the vaginal canal working on the posterior vaginal canal, along the levator ani, sides of the bladder, and along the urethra sphincter to release the restrictions Self-care: Discussed with patient on using the coconut oil for vaginal moisturizers Educated patient on how to mobilize her clitoris, manual work to the perineal body and between the labia's for tissue mobility  DATE: 09/04/24  EVAL Examination completed, findings reviewed, pt educated on POC, HEP, and female pelvic floor anatomy, reasoning with pelvic floor assessment internally with pt consent. Pt motivated to participate  in PT and agreeable to attempt recommendations.     PATIENT EDUCATION:  Education details: educated patient on how to use coconut oil around the vulvar area and urethra to reduce the dryness Person educated: Patient Education method: Explanation, Demonstration, Tactile cues, Verbal cues, and Handouts Education comprehension: verbalized understanding, returned demonstration, verbal cues required, tactile cues required, and needs further education  HOME EXERCISE PROGRAM: See above  ASSESSMENT:  CLINICAL IMPRESSION: Patient is a 88 y.o. female who was seen today for physical therapy  treatment for overactive bladder. Patient is using the coconut oil for vaginal dryness.  Patient has not had urinary leakage since last visit. She understands how to perform the urge to void. She has increased mobility of the lower abdominal tissue.  She is able to perform diaphragmatic breathing and feel the pelvic floor relax. Patient would benefit from skilled therapy to reduce restrictions around the pelvic floor and coordination to reduce the urgency.   OBJECTIVE IMPAIRMENTS: decreased activity tolerance, decreased coordination, decreased endurance, and increased fascial restrictions.   ACTIVITY LIMITATIONS: continence  PARTICIPATION LIMITATIONS: community activity  PERSONAL FACTORS: 1-2 comorbidities: Appendectomy, osteoporosis are also affecting patient's functional outcome.   REHAB POTENTIAL: Excellent  CLINICAL DECISION MAKING: Evolving/moderate complexity  EVALUATION COMPLEXITY: Moderate   GOALS: Goals reviewed with patient? Yes  SHORT TERM GOALS: Target date: 10/02/24  Patient educated on vaginal moisturizers to improve the health of the vulvar tissue.  Baseline: Goal status: Met 09/12/24  2.  Patient educated on the urge to void to relax the pelvic floor.  Baseline:  Goal status: Met 09/18/24  3.  Patient is able to perform diaphragmatic breathing and feel the pelvic floor relax.   Baseline:  Goal status: Met 09/18/24   LONG TERM GOALS: Target date: 11/27/24  Patient independent with advanced HEP for flexibility, down training of the pelvic floor muscles, and coordination of the pelvic floor muscles.  Baseline:  Goal status: INITIAL  2.  Patient is able to have the urge to urinate and wait 5 minutes prior to walking to the bathroom due to using urge suppression.  Baseline:  Goal status: INITIAL  3.  Patient reports her urinary urgency has decreased >/=70% due to elongation of the tissue and using vaginal moisturizers.  Baseline:  Goal status: INITIAL  4.  PFIQ-7 decreased </= 10 compared to  22 due to the reduction of urgency symptoms.  Baseline:  Goal status: INITIAL   PLAN:  PT FREQUENCY: 1-2x/week  PT DURATION: 12 weeks  PLANNED INTERVENTIONS: 97110-Therapeutic exercises, 97530- Therapeutic activity, 97112- Neuromuscular re-education, 97535- Self Care, [id number]- Manual therapy, [id number]- Electrical stimulation (unattended), 20560 (1-2 muscles), 20561 (3+ muscles)- Dry Needling, Patient/Family education, Cryotherapy, Moist heat, and Biofeedback  PLAN FOR NEXT SESSION: manual work to the pelvic floor introitus, levator ani and urethra   Channing Pereyra, PT 09/18/2024 3:25 PM

## 2024-09-30 ENCOUNTER — Encounter: Payer: Self-pay | Admitting: Physical Therapy

## 2024-09-30 ENCOUNTER — Ambulatory Visit: Admitting: Physical Therapy

## 2024-09-30 DIAGNOSIS — R279 Unspecified lack of coordination: Secondary | ICD-10-CM

## 2024-09-30 DIAGNOSIS — M6281 Muscle weakness (generalized): Secondary | ICD-10-CM

## 2024-09-30 NOTE — Therapy (Signed)
 " OUTPATIENT PHYSICAL THERAPY FEMALE PELVIC TREATMENT   Patient Name: Terry Edwards MRN: [id number] DOB:28-Dec-1935, 88 y.o., female Today's Date: 09/30/2024  END OF SESSION:  PT End of Session - 09/30/24 0800     Visit Number 4    Date for Recertification  11/27/24    Authorization Type [employer]    Authorization - Visit Number 4    Authorization - Number of Visits 10    PT Start Time 0800    PT Stop Time 0840    PT Time Calculation (min) 40 min    Activity Tolerance Patient tolerated treatment well    Behavior During Therapy [facility] for tasks assessed/performed          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   Cataract    removed bilateral eyes   Chronic hepatitis B without hepatic coma (HCC) 1970s   chronic elev AFP   Cyst of pineal gland    incidental on MRI   Heart murmur    Hemorrhoids    History of bone density study 2022   History of chicken pox    History of depression 2006   situational, on Lexapro for 1 year   History of mammogram 2024   History of MRI 2024   Right shoulder   Hyperlipidemia    Osteopenia    hx fx 5th MT R foot 03/2013   Osteoporosis    Plantar fasciitis of left foot    Per [facility] new patient packet   Right rotator cuff tendonitis 2013   resolved with PT (injections not helpful)   Urine incontinence    Vitamin D  deficiency    Past Surgical History:  Procedure Laterality Date   APPENDECTOMY  1957   BLEPHAROPLASTY     Per [facility] new patient packet   COLONOSCOPY     EYE SURGERY Bilateral 03/09/15, 03/23/15   cataract removal   TONSILLECTOMY  1940's   Patient Active Problem List   Diagnosis Date Noted   Hardening of the aorta (main artery of the heart) 06/14/2023   Thoracic aortic aneurysm 07/27/2022   Elevated blood pressure reading without diagnosis of hypertension 11/13/2021   Right hand pain 11/13/2021    Impingement syndrome of shoulder region 10/25/2021   AF (amaurosis fugax) 07/09/2021   Drug-induced skin rash 04/23/2021   Cyst of joint of hand, right 01/26/2021   Thinning hair 10/12/2020   Murmur, cardiac 10/12/2020   Traumatic incomplete tear of right rotator cuff 10/12/2020   Urge incontinence 10/12/2020   Routine general medical examination at a health care facility 01/09/2015   Chronic toe pain, right foot 09/03/2014   Cyst of pineal gland    Vitamin D  deficiency    Osteoporosis    Hx of type B viral hepatitis    Hyperlipidemia     PCP: None  REFERRING PROVIDER: Zuleta, Kaitlin G, NP   REFERRING DIAG:  N32.81 (ICD-10-CM) - Overactive bladder  R39.15 (ICD-10-CM) - Urinary urgency  R35.0 (ICD-10-CM) - Urinary frequency  N39.41 (ICD-10-CM) - Urge incontinence    THERAPY DIAG:  Muscle weakness (generalized)  Unspecified lack of coordination  Rationale for Evaluation and Treatment: Rehabilitation  ONSET DATE: 2020  SUBJECTIVE:  SUBJECTIVE STATEMENT: Bladder feels mostly pretty good. Urine flow in the morning is stronger.   Fluid intake: water and herbal tea. NO caffeine.   FUNCTIONAL LIMITATIONS: urinary urgency  PERTINENT HISTORY:  Medications for current condition: none Surgeries: Appendectomy Other: Cancer; Osteoporosis Sexual abuse: No  PAIN:  Are you having pain? No   PRECAUTIONS: None  RED FLAGS: None   WEIGHT BEARING RESTRICTIONS: No  FALLS:  Has patient fallen in last 6 months? No  OCCUPATION: retired  ACTIVITY LEVEL : walk for exercise  PLOF: Independent  PATIENT GOALS: control the urgency   BOWEL MOVEMENT:no issues  URINATION: Pain with urination: No Fully empty bladder: Yes: sometimes due to a weak flow                                          Post-void dribble: No Stream: Weak Urgency: Yes , once she has the urge to urinate it is very strong.  Frequency:during the day AM goes every hour; after lunch goes 2-3 hours                                                        Nocturia: No1 time   Leakage: none Pads/briefs: No  INTERCOURSE:not active    PREGNANCY: Vaginal deliveries 4 Episiotomy  yes with first child  PROLAPSE: None   OBJECTIVE:  Note: Objective measures were completed at Evaluation unless otherwise noted.  PATIENT SURVEYS:  PFIQ-7: 22 UIQ-7 24   COGNITION: Overall cognitive status: Within functional limits for tasks assessed     SENSATION: Light touch: Appears intact  FUNCTIONAL TESTS:  Single leg stance:  Mu:pwrmzjdzi trunk sway  Ou:pwrmzjdzi trunk sway   GAIT: Assistive device utilized: None  POSTURE: rounded shoulders, forward head, and increased thoracic kyphosis   LUMBARAROM/PROM:  A/PROM A/PROM  Eval (% available)  Flexion 100  Extension 50  Right lateral flexion 75  Left lateral flexion 75  Right rotation 75  Left rotation 75   (Blank rows = not tested)  LOWER EXTREMITY ROM: full bilateral hip ROM   LOWER EXTREMITY MMT:  MMT Right eval Left eval  Hip abduction 4/5 4/5   (Blank rows = not tested) PALPATION: Pelvic Alignment: ASIS equal  Abdominal:   Breathing: good movement of the rib cage and able to perform diaphragmatic breathing Scar tissue: No                 External Perineal Exam: vulvar area is pale and dry.                              Internal Pelvic Floor: tenderness located on the urethra, tightness along the introitus, along the levator ane  Patient confirms identification and approves PT to assess internal pelvic floor and treatment Yes All internal or external pelvic floor assessments and/or treatments are completed with proper hand hygiene and gloves hands. If needed gloves are changed with hand hygiene during patient care time. No  emotional/communication barriers or cognitive limitation. Patient is motivated to learn. Patient understands and agrees with treatment goals and plan. PT explains patient will be examined in standing, sitting, and lying down to see how their  muscles and joints work. When they are ready, they will be asked to remove their underwear so PT can examine their perineum. The patient is also given the option of providing their own chaperone as one is not provided in our facility. The patient also has the right and is explained the right to defer or refuse any part of the evaluation or treatment including the internal exam. With the patient's consent, PT will use one gloved finger to gently assess the muscles of the pelvic floor, seeing how well it contracts and relaxes and if there is muscle symmetry. After, the patient will get dressed and PT and patient will discuss exam findings and plan of care. PT and patient discuss plan of care, schedule, attendance policy and HEP activities.   PELVIC MMT:   MMT eval 09/30/24  Vaginal 2/5 with weak circular hug of finger 3/5 with hug of therapist finger  (Blank rows = not tested)        TONE: Average   PROLAPSE: none  TODAY'S TREATMENT:    09/30/24 Manual: Myofascial release: Fascial release around the clitoris, around the labia and perineal body to elongate the tissue Internal pelvic floor techniques: No emotional/communication barriers or cognitive limitation. Patient is motivated to learn. Patient understands and agrees with treatment goals and plan. PT explains patient will be examined in standing, sitting, and lying down to see how their muscles and joints work. When they are ready, they will be asked to remove their underwear so PT can examine their perineum. The patient is also given the option of providing their own chaperone as one is not provided in our facility. The patient also has the right and is explained the right to defer or refuse any part of  the evaluation or treatment including the internal exam. With the patient's consent, PT will use one gloved finger to gently assess the muscles of the pelvic floor, seeing how well it contracts and relaxes and if there is muscle symmetry. After, the patient will get dressed and PT and patient will discuss exam findings and plan of care. PT and patient discuss plan of care, schedule, attendance policy and HEP activities.  Therapist gloved finger in the vaginal canal performing fascial release to the urethra and urethra sphincter and sides of the introitus Exercises: Stretches/mobility: Sitting piriformis stretch holding 30 sec on the right Hip adductor stretch in standing holding 30 sec. On the right Hip flexor stretch on the right in sitting holding 30 sec    09/18/24 Manual: Myofascial release: Fascial release around the urachus ligament, area where the ureter is and kidneys  Neuromuscular re-education: Down training: Diaphragmatic breathing to relax the pelvic floor in supine, sitting and standing Educated patient on the urge to void for suppression of urinary urgency and patient returned demonstration Self-care: Reviewed vaginal moisturizer and using the coconut oil.  Reviewed on how to mobilize the clitoris and perineal tissue to reduce urgency    09/12/24 Manual: Myofascial release: Fascial release around the clitoris, along the urogenital diaphragm, between the labia and along the perineal body going through the restrictions. Educated patient on how to perform at home.  Internal pelvic floor techniques: No emotional/communication barriers or cognitive limitation. Patient is motivated to learn. Patient understands and agrees with treatment goals and plan. PT explains patient will be examined in standing, sitting, and lying down to see how their muscles and joints work. When they are ready, they will be asked to remove their underwear so PT can examine their  perineum. The patient is  also given the option of providing their own chaperone as one is not provided in our facility. The patient also has the right and is explained the right to defer or refuse any part of the evaluation or treatment including the internal exam. With the patient's consent, PT will use one gloved finger to gently assess the muscles of the pelvic floor, seeing how well it contracts and relaxes and if there is muscle symmetry. After, the patient will get dressed and PT and patient will discuss exam findings and plan of care. PT and patient discuss plan of care, schedule, attendance policy and HEP activities.  Therapist gloved finger in the vaginal canal working on the posterior vaginal canal, along the levator ani, sides of the bladder, and along the urethra sphincter to release the restrictions Self-care: Discussed with patient on using the coconut oil for vaginal moisturizers Educated patient on how to mobilize her clitoris, manual work to the perineal body and between the labia's for tissue mobility                                                                                                                              DATE: 09/04/24  EVAL Examination completed, findings reviewed, pt educated on POC, HEP, and female pelvic floor anatomy, reasoning with pelvic floor assessment internally with pt consent. Pt motivated to participate in PT and agreeable to attempt recommendations.     PATIENT EDUCATION:  09/30/24 Education details: Access Code: [email or web address] Person educated: Patient Education method: Explanation, Demonstration, Tactile cues, Verbal cues, and Handouts Education comprehension: verbalized understanding, returned demonstration, verbal cues required, tactile cues required, and needs further education  HOME EXERCISE PROGRAM: 09/30/24 Access Code: [email or web address] URL: [email or web address] Date: 09/30/2024 Prepared by: Channing Pereyra  Exercises - Seated Piriformis Stretch with Trunk  Bend  - 1 x daily - 7 x weekly - 1 sets - 2 reps - 30 sec hold - Side Lunge Adductor Stretch  - 1 x daily - 7 x weekly - 1 sets - 2 reps - 30 sec hold - Seated Hip Flexor Stretch on Swiss Ball  - 1 x daily - 7 x weekly - 1 sets - 2 reps - 30 sec hold  ASSESSMENT:  CLINICAL IMPRESSION: Patient is a 88 y.o. female who was seen today for physical therapy  treatment for overactive bladder. Urine flow in the morning is stronger. The urgency is worse in the morning when I have a cup of tea.   Patient has not had urinary leakage since last visit. She understands how to perform the urge to void and is helping her get to the bathroom without leaking. Pelvic floor strength is 3/5 with hug of therapist finger. Her pelvic floor on the right is tighter.  Patient would benefit from skilled therapy to reduce restrictions around the pelvic floor and coordination to reduce the urgency.   OBJECTIVE IMPAIRMENTS: decreased activity tolerance, decreased coordination, decreased endurance, and  increased fascial restrictions.   ACTIVITY LIMITATIONS: continence  PARTICIPATION LIMITATIONS: community activity  PERSONAL FACTORS: 1-2 comorbidities: Appendectomy, osteoporosis are also affecting patient's functional outcome.   REHAB POTENTIAL: Excellent  CLINICAL DECISION MAKING: Evolving/moderate complexity  EVALUATION COMPLEXITY: Moderate   GOALS: Goals reviewed with patient? Yes  SHORT TERM GOALS: Target date: 10/02/24  Patient educated on vaginal moisturizers to improve the health of the vulvar tissue.  Baseline: Goal status: Met 09/12/24  2.  Patient educated on the urge to void to relax the pelvic floor.  Baseline:  Goal status: Met 09/18/24  3.  Patient is able to perform diaphragmatic breathing and feel the pelvic floor relax.  Baseline:  Goal status: Met 09/18/24   LONG TERM GOALS: Target date: 11/27/24  Patient independent with advanced HEP for flexibility, down training of the pelvic floor  muscles, and coordination of the pelvic floor muscles.  Baseline:  Goal status: INITIAL  2.  Patient is able to have the urge to urinate and wait 5 minutes prior to walking to the bathroom due to using urge suppression.  Baseline:  Goal status: INITIAL  3.  Patient reports her urinary urgency has decreased >/=70% due to elongation of the tissue and using vaginal moisturizers.  Baseline:  Goal status: INITIAL  4.  PFIQ-7 decreased </= 10 compared to  22 due to the reduction of urgency symptoms.  Baseline:  Goal status: INITIAL   PLAN:  PT FREQUENCY: 1-2x/week  PT DURATION: 12 weeks  PLANNED INTERVENTIONS: 97110-Therapeutic exercises, 97530- Therapeutic activity, [id number]- Neuromuscular re-education, 97535- Self Care, [id number]- Manual therapy, [id number]- Electrical stimulation (unattended), [phone] (1-2 muscles), [phone] (3+ muscles)- Dry Needling, Patient/Family education, Cryotherapy, Moist heat, and Biofeedback  PLAN FOR NEXT SESSION: stretches to the pelvic floor on the right, hip IR stretch, hip abduction strength   Channing Pereyra, PT 09/30/2024 8:45 AM  "

## 2024-10-09 ENCOUNTER — Ambulatory Visit: Admitting: Obstetrics and Gynecology

## 2024-10-11 ENCOUNTER — Encounter: Payer: Self-pay | Admitting: Physical Therapy

## 2024-10-11 ENCOUNTER — Ambulatory Visit: Attending: Obstetrics and Gynecology | Admitting: Physical Therapy

## 2024-10-11 DIAGNOSIS — M545 Low back pain, unspecified: Secondary | ICD-10-CM | POA: Diagnosis present

## 2024-10-11 DIAGNOSIS — R293 Abnormal posture: Secondary | ICD-10-CM | POA: Insufficient documentation

## 2024-10-11 DIAGNOSIS — M6281 Muscle weakness (generalized): Secondary | ICD-10-CM | POA: Diagnosis present

## 2024-10-11 DIAGNOSIS — R279 Unspecified lack of coordination: Secondary | ICD-10-CM | POA: Diagnosis present

## 2024-10-11 NOTE — Therapy (Signed)
 " OUTPATIENT PHYSICAL THERAPY FEMALE PELVIC TREATMENT   Patient Name: Terry Edwards MRN: [id number] DOB:1935-11-08, 89 y.o., female Today's Date: 10/11/2024  END OF SESSION:  PT End of Session - 10/11/24 0849     Visit Number 5    Date for Recertification  11/27/24    Authorization Type [employer]    Authorization - Visit Number 5    Authorization - Number of Visits 10    PT Start Time 0845    PT Stop Time 0925    PT Time Calculation (min) 40 min    Activity Tolerance Patient tolerated treatment well    Behavior During Therapy [facility] for tasks assessed/performed          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   Cataract    removed bilateral eyes   Chronic hepatitis B without hepatic coma (HCC) 1970s   chronic elev AFP   Cyst of pineal gland    incidental on MRI   Heart murmur    Hemorrhoids    History of bone density study 2022   History of chicken pox    History of depression 2006   situational, on Lexapro for 1 year   History of mammogram 2024   History of MRI 2024   Right shoulder   Hyperlipidemia    Osteopenia    hx fx 5th MT R foot 03/2013   Osteoporosis    Plantar fasciitis of left foot    Per [facility] new patient packet   Right rotator cuff tendonitis 2013   resolved with PT (injections not helpful)   Urine incontinence    Vitamin D  deficiency    Past Surgical History:  Procedure Laterality Date   APPENDECTOMY  1957   BLEPHAROPLASTY     Per [facility] new patient packet   COLONOSCOPY     EYE SURGERY Bilateral 03/09/15, 03/23/15   cataract removal   TONSILLECTOMY  1940's   Patient Active Problem List   Diagnosis Date Noted   Hardening of the aorta (main artery of the heart) 06/14/2023   Thoracic aortic aneurysm 07/27/2022   Elevated blood pressure reading without diagnosis of hypertension 11/13/2021   Right hand pain 11/13/2021    Impingement syndrome of shoulder region 10/25/2021   AF (amaurosis fugax) 07/09/2021   Drug-induced skin rash 04/23/2021   Cyst of joint of hand, right 01/26/2021   Thinning hair 10/12/2020   Murmur, cardiac 10/12/2020   Traumatic incomplete tear of right rotator cuff 10/12/2020   Urge incontinence 10/12/2020   Routine general medical examination at a health care facility 01/09/2015   Chronic toe pain, right foot 09/03/2014   Cyst of pineal gland    Vitamin D  deficiency    Osteoporosis    Hx of type B viral hepatitis    Hyperlipidemia     PCP: None  REFERRING PROVIDER: Zuleta, Kaitlin G, NP   REFERRING DIAG:  N32.81 (ICD-10-CM) - Overactive bladder  R39.15 (ICD-10-CM) - Urinary urgency  R35.0 (ICD-10-CM) - Urinary frequency  N39.41 (ICD-10-CM) - Urge incontinence    THERAPY DIAG:  Muscle weakness (generalized)  Unspecified lack of coordination  Rationale for Evaluation and Treatment: Rehabilitation  ONSET DATE: 2020  SUBJECTIVE:  SUBJECTIVE STATEMENT: The pelvic floor is better I have not had problems since last visit.  Fluid intake: water and herbal tea. NO caffeine.   FUNCTIONAL LIMITATIONS: urinary urgency  PERTINENT HISTORY:  Medications for current condition: none Surgeries: Appendectomy Other: Cancer; Osteoporosis Sexual abuse: No  PAIN:  Are you having pain? No   PRECAUTIONS: None  RED FLAGS: None   WEIGHT BEARING RESTRICTIONS: No  FALLS:  Has patient fallen in last 6 months? No  OCCUPATION: retired  ACTIVITY LEVEL : walk for exercise  PLOF: Independent  PATIENT GOALS: control the urgency   BOWEL MOVEMENT:no issues  URINATION: Pain with urination: No Fully empty bladder: Yes: sometimes due to a weak flow                                          Post-void dribble: No Stream: Weak Urgency: Yes , once she has the urge to urinate it is very strong.  Frequency:during the day AM goes every hour; after lunch goes 2-3 hours                                                        Nocturia: No1 time   Leakage: none Pads/briefs: No  INTERCOURSE:not active    PREGNANCY: Vaginal deliveries 4 Episiotomy  yes with first child  PROLAPSE: None   OBJECTIVE:  Note: Objective measures were completed at Evaluation unless otherwise noted.  PATIENT SURVEYS:  PFIQ-7: 22 UIQ-7 24   COGNITION: Overall cognitive status: Within functional limits for tasks assessed     SENSATION: Light touch: Appears intact  FUNCTIONAL TESTS:  Single leg stance:  Mu:pwrmzjdzi trunk sway  Ou:pwrmzjdzi trunk sway   GAIT: Assistive device utilized: None  POSTURE: rounded shoulders, forward head, and increased thoracic kyphosis   LUMBARAROM/PROM:  A/PROM A/PROM  Eval (% available)  Flexion 100  Extension 50  Right lateral flexion 75  Left lateral flexion 75  Right rotation 75  Left rotation 75   (Blank rows = not tested)  LOWER EXTREMITY ROM: full bilateral hip ROM   LOWER EXTREMITY MMT:  MMT Right eval Left eval  Hip abduction 4/5 4/5   (Blank rows = not tested) PALPATION: Pelvic Alignment: ASIS equal  Abdominal:   Breathing: good movement of the rib cage and able to perform diaphragmatic breathing Scar tissue: No                 External Perineal Exam: vulvar area is pale and dry.                              Internal Pelvic Floor: tenderness located on the urethra, tightness along the introitus, along the levator ane  Patient confirms identification and approves PT to assess internal pelvic floor and treatment Yes All internal or external pelvic floor assessments and/or treatments are completed with proper hand hygiene and gloves hands. If needed gloves are changed with hand hygiene during patient care time. No  emotional/communication barriers or cognitive limitation. Patient is motivated to learn. Patient understands and agrees with treatment goals and plan. PT explains patient will be examined in standing, sitting, and lying down to see how their  muscles and joints work. When they are ready, they will be asked to remove their underwear so PT can examine their perineum. The patient is also given the option of providing their own chaperone as one is not provided in our facility. The patient also has the right and is explained the right to defer or refuse any part of the evaluation or treatment including the internal exam. With the patient's consent, PT will use one gloved finger to gently assess the muscles of the pelvic floor, seeing how well it contracts and relaxes and if there is muscle symmetry. After, the patient will get dressed and PT and patient will discuss exam findings and plan of care. PT and patient discuss plan of care, schedule, attendance policy and HEP activities.   PELVIC MMT:   MMT eval 09/30/24  Vaginal 2/5 with weak circular hug of finger 3/5 with hug of therapist finger  (Blank rows = not tested)        TONE: Average   PROLAPSE: none  TODAY'S TREATMENT:   10/11/24 Neuromuscular re-education: Core retraining: Core facilitation: Transverse abdominus contraction supine 10 x  Supine marching with core engaged 20 x  Supine alternate shoulder flexion 20 x with core engaged Supine alternate shoulder and hip flexion 20 x  Standing pallof with red band 15 x each way Bilateral shoulder flexion with red band and marching 20 x  Exercises: Stretches/mobility: Supine move knees side to side 10 x  Hamstring stretch holding 30 sec each Butterfly stretch holding 1 minutes Cat cow 20 x with tactile cues to fully extend her lumbar spine Karolynn pose with diaphragmatic breathing     09/30/24 Manual: Myofascial release: Fascial release around the clitoris, around the labia and perineal  body to elongate the tissue Internal pelvic floor techniques: No emotional/communication barriers or cognitive limitation. Patient is motivated to learn. Patient understands and agrees with treatment goals and plan. PT explains patient will be examined in standing, sitting, and lying down to see how their muscles and joints work. When they are ready, they will be asked to remove their underwear so PT can examine their perineum. The patient is also given the option of providing their own chaperone as one is not provided in our facility. The patient also has the right and is explained the right to defer or refuse any part of the evaluation or treatment including the internal exam. With the patient's consent, PT will use one gloved finger to gently assess the muscles of the pelvic floor, seeing how well it contracts and relaxes and if there is muscle symmetry. After, the patient will get dressed and PT and patient will discuss exam findings and plan of care. PT and patient discuss plan of care, schedule, attendance policy and HEP activities.  Therapist gloved finger in the vaginal canal performing fascial release to the urethra and urethra sphincter and sides of the introitus Exercises: Stretches/mobility: Sitting piriformis stretch holding 30 sec on the right Hip adductor stretch in standing holding 30 sec. On the right Hip flexor stretch on the right in sitting holding 30 sec    09/18/24 Manual: Myofascial release: Fascial release around the urachus ligament, area where the ureter is and kidneys  Neuromuscular re-education: Down training: Diaphragmatic breathing to relax the pelvic floor in supine, sitting and standing Educated patient on the urge to void for suppression of urinary urgency and patient returned demonstration Self-care: Reviewed vaginal moisturizer and using the coconut oil.  Reviewed on how to mobilize the clitoris and  perineal tissue to reduce urgency     PATIENT EDUCATION:   09/30/24 Education details: Access Code: [email or web address] Person educated: Patient Education method: Explanation, Demonstration, Tactile cues, Verbal cues, and Handouts Education comprehension: verbalized understanding, returned demonstration, verbal cues required, tactile cues required, and needs further education  HOME EXERCISE PROGRAM: 09/30/24 Access Code: [email or web address] URL: [email or web address] Date: 10/11/2024 Prepared by: Channing Pereyra  Exercises - Seated Piriformis Stretch with Trunk Bend  - 1 x daily - 7 x weekly - 1 sets - 2 reps - 30 sec hold - Side Lunge Adductor Stretch  - 1 x daily - 7 x weekly - 1 sets - 2 reps - 30 sec hold - Seated Hip Flexor Stretch on Swiss Ball  - 1 x daily - 7 x weekly - 1 sets - 2 reps - 30 sec hold - Supine Lower Trunk Rotation  - 1 x daily - 7 x weekly - 1 sets - 10 reps - Diaphragmatic Breathing in Child's Pose with Pelvic Floor Relaxation  - 1 x daily - 7 x weekly - 1 sets - 1 reps - 30 sec hold - Dead Bug  - 1 x daily - 3 x weekly - 2 sets - 10 reps - Beginner Bridge  - 1 x daily - 3 x weekly - 1 sets - 10 reps - Standing Anti-Rotation Press with Anchored Resistance  - 1 x daily - 3 x weekly - 2 sets - 10 reps - Resistance Pulldown with March  - 1 x daily - 3 x weekly - 2 sets - 10 reps  ASSESSMENT:  CLINICAL IMPRESSION: Patient is a 89 y.o. female who was seen today for physical therapy  treatment for overactive bladder. No urinary leakage since last visit. Her urgency has reduced. She is working her pelvic floor and core exercises.  Patient would benefit from skilled therapy to reduce restrictions around the pelvic floor and coordination to reduce the urgency.   OBJECTIVE IMPAIRMENTS: decreased activity tolerance, decreased coordination, decreased endurance, and increased fascial restrictions.   ACTIVITY LIMITATIONS: continence  PARTICIPATION LIMITATIONS: community activity  PERSONAL FACTORS: 1-2 comorbidities: Appendectomy,  osteoporosis are also affecting patient's functional outcome.   REHAB POTENTIAL: Excellent  CLINICAL DECISION MAKING: Evolving/moderate complexity  EVALUATION COMPLEXITY: Moderate   GOALS: Goals reviewed with patient? Yes  SHORT TERM GOALS: Target date: 10/02/24  Patient educated on vaginal moisturizers to improve the health of the vulvar tissue.  Baseline: Goal status: Met 09/12/24  2.  Patient educated on the urge to void to relax the pelvic floor.  Baseline:  Goal status: Met 09/18/24  3.  Patient is able to perform diaphragmatic breathing and feel the pelvic floor relax.  Baseline:  Goal status: Met 09/18/24   LONG TERM GOALS: Target date: 11/27/24  Patient independent with advanced HEP for flexibility, down training of the pelvic floor muscles, and coordination of the pelvic floor muscles.  Baseline:  Goal status: INITIAL  2.  Patient is able to have the urge to urinate and wait 5 minutes prior to walking to the bathroom due to using urge suppression.  Baseline:  Goal status: INITIAL  3.  Patient reports her urinary urgency has decreased >/=70% due to elongation of the tissue and using vaginal moisturizers.  Baseline:  Goal status: INITIAL  4.  PFIQ-7 decreased </= 10 compared to  22 due to the reduction of urgency symptoms.  Baseline:  Goal status: INITIAL   PLAN:  PT FREQUENCY: 1-2x/week  PT DURATION: 12 weeks  PLANNED  INTERVENTIONS: 97110-Therapeutic exercises, 97530- Therapeutic activity, [id number]- Neuromuscular re-education, [id number]- Self Care, [id number]- Manual therapy, [id number]- Electrical stimulation (unattended), [phone] (1-2 muscles), [id number] (3+ muscles)- Dry Needling, Patient/Family education, Cryotherapy, Moist heat, and Biofeedback  PLAN FOR NEXT SESSION: review HEP and if doing well then discharge   Channing Pereyra, PT 10/11/2024 9:28 AM  "

## 2024-10-14 ENCOUNTER — Encounter: Payer: Self-pay | Admitting: *Deleted

## 2024-10-16 ENCOUNTER — Other Ambulatory Visit: Payer: Self-pay | Admitting: Family Medicine

## 2024-10-16 ENCOUNTER — Encounter: Payer: Self-pay | Admitting: Family Medicine

## 2024-10-16 ENCOUNTER — Ambulatory Visit: Admitting: Physical Therapy

## 2024-10-16 ENCOUNTER — Encounter: Payer: Self-pay | Admitting: Physical Therapy

## 2024-10-16 DIAGNOSIS — M6281 Muscle weakness (generalized): Secondary | ICD-10-CM | POA: Diagnosis not present

## 2024-10-16 DIAGNOSIS — R4189 Other symptoms and signs involving cognitive functions and awareness: Secondary | ICD-10-CM

## 2024-10-16 DIAGNOSIS — M545 Low back pain, unspecified: Secondary | ICD-10-CM

## 2024-10-16 DIAGNOSIS — R293 Abnormal posture: Secondary | ICD-10-CM

## 2024-10-16 DIAGNOSIS — R279 Unspecified lack of coordination: Secondary | ICD-10-CM

## 2024-10-16 NOTE — Therapy (Signed)
 " OUTPATIENT PHYSICAL THERAPY FEMALE PELVIC TREATMENT   Patient Name: Terry Edwards MRN: [id number] DOB:05-Apr-1936, 89 y.o., female Today's Date: 10/16/2024  END OF SESSION:  PT End of Session - 10/16/24 1447     Visit Number 6    Date for Recertification  11/27/24    Authorization Type [employer]    Authorization - Visit Number 6    Authorization - Number of Visits 10    PT Start Time 1445    PT Stop Time 1525    PT Time Calculation (min) 40 min    Activity Tolerance Patient tolerated treatment well    Behavior During Therapy [facility] for tasks assessed/performed          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   Cataract    removed bilateral eyes   Chronic hepatitis B without hepatic coma (HCC) 1970s   chronic elev AFP   Cyst of pineal gland    incidental on MRI   Heart murmur    Hemorrhoids    History of bone density study 2022   History of chicken pox    History of depression 2006   situational, on Lexapro for 1 year   History of mammogram 2024   History of MRI 2024   Right shoulder   Hyperlipidemia    Osteopenia    hx fx 5th MT R foot 03/2013   Osteoporosis    Plantar fasciitis of left foot    Per [facility] new patient packet   Right rotator cuff tendonitis 2013   resolved with PT (injections not helpful)   Urine incontinence    Vitamin D  deficiency    Past Surgical History:  Procedure Laterality Date   APPENDECTOMY  1957   BLEPHAROPLASTY     Per [facility] new patient packet   COLONOSCOPY     EYE SURGERY Bilateral 03/09/15, 03/23/15   cataract removal   TONSILLECTOMY  1940's   Patient Active Problem List   Diagnosis Date Noted   Hardening of the aorta (main artery of the heart) 06/14/2023   Thoracic aortic aneurysm 07/27/2022   Elevated blood pressure reading without diagnosis of hypertension 11/13/2021   Right hand pain 11/13/2021    Impingement syndrome of shoulder region 10/25/2021   AF (amaurosis fugax) 07/09/2021   Drug-induced skin rash 04/23/2021   Cyst of joint of hand, right 01/26/2021   Thinning hair 10/12/2020   Murmur, cardiac 10/12/2020   Traumatic incomplete tear of right rotator cuff 10/12/2020   Urge incontinence 10/12/2020   Routine general medical examination at a health care facility 01/09/2015   Chronic toe pain, right foot 09/03/2014   Cyst of pineal gland    Vitamin D  deficiency    Osteoporosis    Hx of type B viral hepatitis    Hyperlipidemia     PCP: None  REFERRING PROVIDER: Zuleta, Kaitlin G, NP   REFERRING DIAG:  N32.81 (ICD-10-CM) - Overactive bladder  R39.15 (ICD-10-CM) - Urinary urgency  R35.0 (ICD-10-CM) - Urinary frequency  N39.41 (ICD-10-CM) - Urge incontinence    THERAPY DIAG:  Muscle weakness (generalized)  Unspecified lack of coordination  Acute right-sided low back pain without sciatica  Abnormal posture  Rationale for Evaluation and Treatment: Rehabilitation  ONSET DATE: 2020  SUBJECTIVE:  SUBJECTIVE STATEMENT: I am doing okay.  Fluid intake: water and herbal tea. NO caffeine.   FUNCTIONAL LIMITATIONS: urinary urgency  PERTINENT HISTORY:  Medications for current condition: none Surgeries: Appendectomy Other: Cancer; Osteoporosis Sexual abuse: No  PAIN:  Are you having pain? No   PRECAUTIONS: None  RED FLAGS: None   WEIGHT BEARING RESTRICTIONS: No  FALLS:  Has patient fallen in last 6 months? No  OCCUPATION: retired  ACTIVITY LEVEL : walk for exercise  PLOF: Independent  PATIENT GOALS: control the urgency   BOWEL MOVEMENT:no issues  URINATION: Pain with urination: No Fully empty bladder: Yes: sometimes due to a weak flow                                          Post-void dribble: No Stream: Weak Urgency: Yes , once she has the urge to urinate it is very strong.  Frequency:during the day AM goes every hour; after lunch goes 2-3 hours                                                        Nocturia: No1 time   Leakage: none Pads/briefs: No  INTERCOURSE:not active    PREGNANCY: Vaginal deliveries 4 Episiotomy  yes with first child  PROLAPSE: None   OBJECTIVE:  Note: Objective measures were completed at Evaluation unless otherwise noted.  PATIENT SURVEYS:  PFIQ-7: 22 UIQ-7 24 10/16/24: PFIQ-7: 11 UIQ-7 10   COGNITION: Overall cognitive status: Within functional limits for tasks assessed     SENSATION: Light touch: Appears intact  FUNCTIONAL TESTS:  Single leg stance:  Mu:pwrmzjdzi trunk sway  Ou:pwrmzjdzi trunk sway   GAIT: Assistive device utilized: None  POSTURE: rounded shoulders, forward head, and increased thoracic kyphosis   LUMBARAROM/PROM:  A/PROM A/PROM  Eval (% available)  Flexion 100  Extension 50  Right lateral flexion 75  Left lateral flexion 75  Right rotation 75  Left rotation 75   (Blank rows = not tested)  LOWER EXTREMITY ROM: full bilateral hip ROM   LOWER EXTREMITY MMT:  MMT Right eval Left eval  Hip abduction 4/5 4/5   (Blank rows = not tested) PALPATION: Pelvic Alignment: ASIS equal  Abdominal:   Breathing: good movement of the rib cage and able to perform diaphragmatic breathing Scar tissue: No                 External Perineal Exam: vulvar area is pale and dry.                              Internal Pelvic Floor: tenderness located on the urethra, tightness along the introitus, along the levator ane  Patient confirms identification and approves PT to assess internal pelvic floor and treatment Yes All internal or external pelvic floor assessments and/or treatments are completed with proper hand hygiene and gloves hands. If needed gloves are changed with hand  hygiene during patient care time. No emotional/communication barriers or cognitive limitation. Patient is motivated to learn. Patient understands and agrees with treatment goals and plan. PT explains patient will be examined in standing, sitting, and lying down to see how their muscles and joints work.  When they are ready, they will be asked to remove their underwear so PT can examine their perineum. The patient is also given the option of providing their own chaperone as one is not provided in our facility. The patient also has the right and is explained the right to defer or refuse any part of the evaluation or treatment including the internal exam. With the patient's consent, PT will use one gloved finger to gently assess the muscles of the pelvic floor, seeing how well it contracts and relaxes and if there is muscle symmetry. After, the patient will get dressed and PT and patient will discuss exam findings and plan of care. PT and patient discuss plan of care, schedule, attendance policy and HEP activities.   PELVIC MMT:   MMT eval 09/30/24  Vaginal 2/5 with weak circular hug of finger 3/5 with hug of therapist finger  (Blank rows = not tested)        TONE: Average   PROLAPSE: none  TODAY'S TREATMENT:   10/16/24 Neuromuscular re-education: Core facilitation: Supine alternate shoulder and hip flexion 20 x Supine SLR 15 x each side with abdominal engagement Bridge 15 x  Bilateral shoulder flexion with red band and marching 10 x but hurt patients shoulder Exercises: Stretches/mobility: Supine piriformis stretch holding 30 sec each Lower trunk rotation 10 x  Plank against the wall with marching 2 x 10     10/11/24 Neuromuscular re-education: Core facilitation: Transverse abdominus contraction supine 10 x  Supine marching with core engaged 20 x  Supine alternate shoulder flexion 20 x with core engaged Supine alternate shoulder and hip flexion 20 x  Standing pallof with red band 15 x  each way Bilateral shoulder flexion with red band and marching 20 x  Exercises: Stretches/mobility: Supine move knees side to side 10 x  Hamstring stretch holding 30 sec each Butterfly stretch holding 1 minutes Cat cow 20 x with tactile cues to fully extend her lumbar spine Childs pose with diaphragmatic breathing     09/30/24 Manual: Myofascial release: Fascial release around the clitoris, around the labia and perineal body to elongate the tissue Internal pelvic floor techniques: No emotional/communication barriers or cognitive limitation. Patient is motivated to learn. Patient understands and agrees with treatment goals and plan. PT explains patient will be examined in standing, sitting, and lying down to see how their muscles and joints work. When they are ready, they will be asked to remove their underwear so PT can examine their perineum. The patient is also given the option of providing their own chaperone as one is not provided in our facility. The patient also has the right and is explained the right to defer or refuse any part of the evaluation or treatment including the internal exam. With the patient's consent, PT will use one gloved finger to gently assess the muscles of the pelvic floor, seeing how well it contracts and relaxes and if there is muscle symmetry. After, the patient will get dressed and PT and patient will discuss exam findings and plan of care. PT and patient discuss plan of care, schedule, attendance policy and HEP activities.  Therapist gloved finger in the vaginal canal performing fascial release to the urethra and urethra sphincter and sides of the introitus Exercises: Stretches/mobility: Sitting piriformis stretch holding 30 sec on the right Hip adductor stretch in standing holding 30 sec. On the right Hip flexor stretch on the right in sitting holding 30 sec   PATIENT EDUCATION:  10/16/24 Education details: Access  Code: [email or web address] Person educated:  Patient Education method: Explanation, Demonstration, Tactile cues, Verbal cues, and Handouts Education comprehension: verbalized understanding, returned demonstration, verbal cues required, tactile cues required, and needs further education  HOME EXERCISE PROGRAM: 10/16/24 Access Code: [email or web address] URL: [email or web address] Date: 10/16/2024 Prepared by: Channing Pereyra  Program Notes continue with the coconut oil 1-2 times per day  Exercises - Seated Piriformis Stretch with Trunk Bend  - 1 x daily - 7 x weekly - 1 sets - 2 reps - 30 sec hold - Side Lunge Adductor Stretch  - 1 x daily - 7 x weekly - 1 sets - 2 reps - 30 sec hold - Seated Hip Flexor Stretch on Swiss Ball  - 1 x daily - 7 x weekly - 1 sets - 2 reps - 30 sec hold - Supine Lower Trunk Rotation  - 1 x daily - 7 x weekly - 1 sets - 10 reps - Diaphragmatic Breathing in Child's Pose with Pelvic Floor Relaxation  - 1 x daily - 7 x weekly - 1 sets - 1 reps - 30 sec hold - Dead Bug  - 1 x daily - 3 x weekly - 2 sets - 10 reps - Beginner Bridge  - 1 x daily - 3 x weekly - 1 sets - 10 reps - Straight Leg Raise  - 1 x daily - 3 x weekly - 2 sets - 10 reps - Supine Piriformis Stretch with Foot on Ground  - 1 x daily - 3 x weekly - 1 sets - 2 reps - 30 sec hold - Full Plank with Hip Flexion/Adduction Knee Drive at Wall  - 1 x daily - 3 x weekly - 2 sets - 10 reps  ASSESSMENT:  CLINICAL IMPRESSION: Patient is a 89 y.o. female who was seen today for physical therapy  treatment for overactive bladder. No urinary leakage since last visit. Her urgency has reduced. Her PFIQ-7 score decreased to 11. She is able to go out of the house without worrying about her urinary issues. Patient will wake up 1 time at night to urinate. She is able to reduce the urgency with using the urge to void suppression technique.   OBJECTIVE IMPAIRMENTS: decreased activity tolerance, decreased coordination, decreased endurance, and increased fascial  restrictions.   ACTIVITY LIMITATIONS: continence  PARTICIPATION LIMITATIONS: community activity  PERSONAL FACTORS: 1-2 comorbidities: Appendectomy, osteoporosis are also affecting patient's functional outcome.   REHAB POTENTIAL: Excellent  CLINICAL DECISION MAKING: Evolving/moderate complexity  EVALUATION COMPLEXITY: Moderate   GOALS: Goals reviewed with patient? Yes  SHORT TERM GOALS: Target date: 10/02/24  Patient educated on vaginal moisturizers to improve the health of the vulvar tissue.  Baseline: Goal status: Met 09/12/24  2.  Patient educated on the urge to void to relax the pelvic floor.  Baseline:  Goal status: Met 09/18/24  3.  Patient is able to perform diaphragmatic breathing and feel the pelvic floor relax.  Baseline:  Goal status: Met 09/18/24   LONG TERM GOALS: Target date: 11/27/24  Patient independent with advanced HEP for flexibility, down training of the pelvic floor muscles, and coordination of the pelvic floor muscles.  Baseline:  Goal status: Met 10/16/24  2.  Patient is able to have the urge to urinate and wait 5 minutes prior to walking to the bathroom due to using urge suppression.  Baseline:  Goal status:  Met 10/16/24  3.  Patient reports her urinary urgency has decreased >/=70% due to elongation of the tissue and using vaginal  moisturizers.  Baseline:  Goal status: Met 10/16/24  4.  PFIQ-7 decreased </= 10 compared to  22 due to the reduction of urgency symptoms.  Baseline:  Goal status: Met 10/16/24   PLAN:Discharge to HEP this visit   Channing Pereyra, PT 10/16/2024 3:20 PM  PHYSICAL THERAPY DISCHARGE SUMMARY  Visits from Start of Care: 6  Current functional level related to goals / functional outcomes: See above.    Remaining deficits: See above.   Education / Equipment: HEP   Patient agrees to discharge. Patient goals were met. Patient is being discharged due to meeting the stated rehab goals. Thank you for the referral.    Channing Pereyra, PT 10/16/2024 3:20 PM   "

## 2024-10-29 ENCOUNTER — Ambulatory Visit
Admission: RE | Admit: 2024-10-29 | Discharge: 2024-10-29 | Disposition: A | Source: Ambulatory Visit | Attending: Family Medicine

## 2024-10-29 DIAGNOSIS — R4189 Other symptoms and signs involving cognitive functions and awareness: Secondary | ICD-10-CM

## 2024-12-11 ENCOUNTER — Institutional Professional Consult (permissible substitution): Admitting: Diagnostic Neuroimaging

## 2025-01-02 ENCOUNTER — Other Ambulatory Visit (HOSPITAL_BASED_OUTPATIENT_CLINIC_OR_DEPARTMENT_OTHER)
